# Patient Record
Sex: Female | Born: 1940 | Race: White | Hispanic: No | Smoking: Never smoker
Health system: Southern US, Community
[De-identification: ages and names within clinical notes are randomized; demographics above are authoritative.]

## PROBLEM LIST (undated history)

## (undated) DIAGNOSIS — I10 Essential (primary) hypertension: Secondary | ICD-10-CM

## (undated) DIAGNOSIS — R001 Bradycardia, unspecified: Secondary | ICD-10-CM

## (undated) DIAGNOSIS — E049 Nontoxic goiter, unspecified: Secondary | ICD-10-CM

## (undated) DIAGNOSIS — M1990 Unspecified osteoarthritis, unspecified site: Secondary | ICD-10-CM

## (undated) DIAGNOSIS — R413 Other amnesia: Secondary | ICD-10-CM

## (undated) DIAGNOSIS — R609 Edema, unspecified: Secondary | ICD-10-CM

## (undated) DIAGNOSIS — E785 Hyperlipidemia, unspecified: Secondary | ICD-10-CM

## (undated) DIAGNOSIS — F09 Unspecified mental disorder due to known physiological condition: Secondary | ICD-10-CM

## (undated) HISTORY — DX: Nontoxic goiter, unspecified: E04.9

## (undated) HISTORY — DX: Edema, unspecified: R60.9

## (undated) HISTORY — DX: Essential (primary) hypertension: I10

## (undated) HISTORY — DX: Other amnesia: R41.3

## (undated) HISTORY — DX: Unspecified mental disorder due to known physiological condition: F09

## (undated) HISTORY — DX: Hyperlipidemia, unspecified: E78.5

## (undated) HISTORY — DX: Bradycardia, unspecified: R00.1

## (undated) HISTORY — DX: Unspecified osteoarthritis, unspecified site: M19.90

---

## 2017-02-25 ENCOUNTER — Encounter: Payer: Self-pay | Admitting: Family Medicine

## 2017-02-25 ENCOUNTER — Ambulatory Visit (INDEPENDENT_AMBULATORY_CARE_PROVIDER_SITE_OTHER): Payer: Self-pay | Admitting: Family Medicine

## 2017-02-25 VITALS — BP 140/54 | HR 58 | Temp 98.2°F | Resp 12 | Ht 61.0 in | Wt 127.0 lb

## 2017-02-25 DIAGNOSIS — Z130 Encounter for screening for diseases of the blood and blood-forming organs and certain disorders involving the immune mechanism: Secondary | ICD-10-CM

## 2017-02-25 DIAGNOSIS — M25551 Pain in right hip: Secondary | ICD-10-CM

## 2017-02-25 DIAGNOSIS — E785 Hyperlipidemia, unspecified: Secondary | ICD-10-CM

## 2017-02-25 DIAGNOSIS — E049 Nontoxic goiter, unspecified: Secondary | ICD-10-CM

## 2017-02-25 DIAGNOSIS — R609 Edema, unspecified: Secondary | ICD-10-CM

## 2017-02-25 DIAGNOSIS — R001 Bradycardia, unspecified: Secondary | ICD-10-CM

## 2017-02-25 DIAGNOSIS — I10 Essential (primary) hypertension: Secondary | ICD-10-CM

## 2017-02-25 LAB — CBC WITH DIFFERENTIAL/PLATELET
Basophils Absolute: 54 cells/uL (ref 0–200)
Basophils Relative: 1 %
EOS PCT: 2 %
Eosinophils Absolute: 108 cells/uL (ref 15–500)
HCT: 39.4 % (ref 35.0–45.0)
Hemoglobin: 13.1 g/dL (ref 11.7–15.5)
LYMPHS PCT: 38 %
Lymphs Abs: 2052 cells/uL (ref 850–3900)
MCH: 29.8 pg (ref 27.0–33.0)
MCHC: 33.2 g/dL (ref 32.0–36.0)
MCV: 89.5 fL (ref 80.0–100.0)
MPV: 9.7 fL (ref 7.5–12.5)
Monocytes Absolute: 270 cells/uL (ref 200–950)
Monocytes Relative: 5 %
NEUTROS PCT: 54 %
Neutro Abs: 2916 cells/uL (ref 1500–7800)
PLATELETS: 269 10*3/uL (ref 140–400)
RBC: 4.4 MIL/uL (ref 3.80–5.10)
RDW: 13.3 % (ref 11.0–15.0)
WBC: 5.4 10*3/uL (ref 3.8–10.8)

## 2017-02-25 LAB — COMPLETE METABOLIC PANEL WITH GFR
ALBUMIN: 4.1 g/dL (ref 3.6–5.1)
ALK PHOS: 68 U/L (ref 33–130)
ALT: 18 U/L (ref 6–29)
AST: 28 U/L (ref 10–35)
BUN: 18 mg/dL (ref 7–25)
CALCIUM: 9 mg/dL (ref 8.6–10.4)
CO2: 22 mmol/L (ref 20–31)
CREATININE: 0.82 mg/dL (ref 0.60–0.93)
Chloride: 95 mmol/L — ABNORMAL LOW (ref 98–110)
GFR, EST AFRICAN AMERICAN: 80 mL/min (ref >=60)
GFR, Est Non African American: 70 mL/min (ref >=60)
Glucose, Bld: 61 mg/dL — ABNORMAL LOW (ref 65–99)
POTASSIUM: 4 mmol/L (ref 3.5–5.3)
Sodium: 133 mmol/L — ABNORMAL LOW (ref 135–146)
Total Bilirubin: 0.6 mg/dL (ref 0.2–1.2)
Total Protein: 6.9 g/dL (ref 6.1–8.1)

## 2017-02-25 LAB — LIPID PANEL
CHOLESTEROL: 142 mg/dL (ref <200)
HDL: 62 mg/dL (ref >50)
LDL Cholesterol: 65 mg/dL (ref <100)
Total CHOL/HDL Ratio: 2.3 Ratio (ref <5.0)
Triglycerides: 77 mg/dL (ref <150)
VLDL: 15 mg/dL (ref <30)

## 2017-02-25 LAB — POCT URINALYSIS DIP (DEVICE)
BILIRUBIN URINE: NEGATIVE
GLUCOSE, UA: NEGATIVE mg/dL
KETONES UR: NEGATIVE mg/dL
Leukocytes, UA: NEGATIVE
NITRITE: NEGATIVE
PH: 6 (ref 5.0–8.0)
PROTEIN: NEGATIVE mg/dL
Specific Gravity, Urine: 1.01 (ref 1.005–1.030)
Urobilinogen, UA: 0.2 mg/dL (ref 0.0–1.0)

## 2017-02-25 LAB — POCT GLYCOSYLATED HEMOGLOBIN (HGB A1C): Hemoglobin A1C: 5.6

## 2017-02-25 MED ORDER — HYDROCHLOROTHIAZIDE 50 MG PO TABS: 50.0000 mg | ORAL_TABLET | Freq: Every day | ORAL | 1 refills | Status: DC

## 2017-02-25 MED ORDER — VALSARTAN 80 MG PO TABS: 160.0000 mg | ORAL_TABLET | Freq: Every day | ORAL | 1 refills | Status: DC

## 2017-02-25 MED ORDER — ATORVASTATIN CALCIUM 10 MG PO TABS: 10.0000 mg | ORAL_TABLET | Freq: Every day | ORAL | 1 refills | Status: DC

## 2017-02-25 MED ORDER — VALSARTAN 80 MG PO TABS: 80.0000 mg | ORAL_TABLET | Freq: Every day | ORAL | 1 refills | Status: DC

## 2017-02-25 NOTE — Patient Instructions (Signed)
Changes to medications as follows:  Stop  Metoprolol as heart rate is low.   Increased Valsartan 160 mg daily.  Hydrochlorothiazide 50 mg daily  take an additional dose in the afternoon at 4:00 pm x 7 days then resume once daily for  current swelling.   She will need a referral to orthopedic surgery for evaluation of right hip pain.  I am also ordering an ultrasound of the neck to evaluate enlarged thyroid gland.       Edema Edema is when you have too much fluid in your body or under your skin. Edema may make your legs, feet, and ankles swell up. Swelling is also common in looser tissues, like around your eyes. This is a common condition. It gets more common as you get older. There are many possible causes of edema. Eating too much salt (sodium) and being on your feet or sitting for a long time can cause edema in your legs, feet, and ankles. Hot weather may make edema worse. Edema is usually painless. Your skin may look swollen or shiny. Follow these instructions at home:  Keep the swollen body part raised (elevated) above the level of your heart when you are sitting or lying down.  Do not sit still or stand for a long time.  Do not wear tight clothes. Do not wear garters on your upper legs.  Exercise your legs. This can help the swelling go down.  Wear elastic bandages or support stockings as told by your doctor.  Eat a low-salt (low-sodium) diet to reduce fluid as told by your doctor.  Depending on the cause of your swelling, you may need to limit how much fluid you drink (fluid restriction).  Take over-the-counter and prescription medicines only as told by your doctor. Contact a doctor if:  Treatment is not working.  You have heart, liver, or kidney disease and have symptoms of edema.  You have sudden and unexplained weight gain. Get help right away if:  You have shortness of breath or chest pain.  You cannot breathe when you lie down.  You have pain, redness,  or warmth in the swollen areas.  You have heart, liver, or kidney disease and get edema all of a sudden.  You have a fever and your symptoms get worse all of a sudden. Summary  Edema is when you have too much fluid in your body or under your skin.  Edema may make your legs, feet, and ankles swell up. Swelling is also common in looser tissues, like around your eyes.  Raise (elevate) the swollen body part above the level of your heart when you are sitting or lying down.  Follow your doctor's instructions about diet and how much fluid you can drink (fluid restriction). This information is not intended to replace advice given to you by your health care provider. Make sure you discuss any questions you have with your health care provider. Document Released: 02/25/2008 Document Revised: 09/26/2016 Document Reviewed: 09/26/2016 Elsevier Interactive Patient Education  2017 Elsevier Inc.  Hypertension Hypertension is another name for high blood pressure. High blood pressure forces your heart to work harder to pump blood. This can cause problems over time. There are two numbers in a blood pressure reading. There is a top number (systolic) over a bottom number (diastolic). It is best to have a blood pressure below 120/80. Healthy choices can help lower your blood pressure. You may need medicine to help lower your blood pressure if:  Your blood pressure cannot  be lowered with healthy choices.  Your blood pressure is higher than 130/80.  Follow these instructions at home: Eating and drinking  If directed, follow the DASH eating plan. This diet includes: ? Filling half of your plate at each meal with fruits and vegetables. ? Filling one quarter of your plate at each meal with whole grains. Whole grains include whole wheat pasta, brown rice, and whole grain bread. ? Eating or drinking low-fat dairy products, such as skim milk or low-fat yogurt. ? Filling one quarter of your plate at each meal with  low-fat (lean) proteins. Low-fat proteins include fish, skinless chicken, eggs, beans, and tofu. ? Avoiding fatty meat, cured and processed meat, or chicken with skin. ? Avoiding premade or processed food.  Eat less than 1,500 mg of salt (sodium) a day.  Limit alcohol use to no more than 1 drink a day for nonpregnant women and 2 drinks a day for men. One drink equals 12 oz of beer, 5 oz of wine, or 1 oz of hard liquor. Lifestyle  Work with your doctor to stay at a healthy weight or to lose weight. Ask your doctor what the best weight is for you.  Get at least 30 minutes of exercise that causes your heart to beat faster (aerobic exercise) most days of the week. This may include walking, swimming, or biking.  Get at least 30 minutes of exercise that strengthens your muscles (resistance exercise) at least 3 days a week. This may include lifting weights or pilates.  Do not use any products that contain nicotine or tobacco. This includes cigarettes and e-cigarettes. If you need help quitting, ask your doctor.  Check your blood pressure at home as told by your doctor.  Keep all follow-up visits as told by your doctor. This is important. Medicines  Take over-the-counter and prescription medicines only as told by your doctor. Follow directions carefully.  Do not skip doses of blood pressure medicine. The medicine does not work as well if you skip doses. Skipping doses also puts you at risk for problems.  Ask your doctor about side effects or reactions to medicines that you should watch for. Contact a doctor if:  You think you are having a reaction to the medicine you are taking.  You have headaches that keep coming back (recurring).  You feel dizzy.  You have swelling in your ankles.  You have trouble with your vision. Get help right away if:  You get a very bad headache.  You start to feel confused.  You feel weak or numb.  You feel faint.  You get very bad pain in  your: ? Chest. ? Belly (abdomen).  You throw up (vomit) more than once.  You have trouble breathing. Summary  Hypertension is another name for high blood pressure.  Making healthy choices can help lower blood pressure. If your blood pressure cannot be controlled with healthy choices, you may need to take medicine. This information is not intended to replace advice given to you by your health care provider. Make sure you discuss any questions you have with your health care provider. Document Released: 02/25/2008 Document Revised: 08/06/2016 Document Reviewed: 08/06/2016 Elsevier Interactive Patient Education  [employer]2018 Elsevier Inc.

## 2017-02-25 NOTE — Progress Notes (Signed)
Patient ID: Lynn [name], female    DOB: 1940-12-08, 76 y.o.   MRN: [id number]  PCP: Bing Neighbors, FNP   Subjective:  HPI Lynn [name] is a 76 y.o. female presents to establish care and for hypertension management. Lynn [name] is a native of [place], non-english speaking, and has resided in the [place] for a total of 5 years. Family history only significant for diabetes.   Only medical history includes hypertension and hyperlipidemia originally diagnosed and treated in [place].  Last medical encounter over 1.5 years in [place]. No history of CVA or MI.  Reports 1 syncopal episode 1 year ago which lasted about 2 minutes in which she passed out abruptly and regained consciousness. She was not evaluated in a hospital. No routine monitoring of blood pressure. Adheres to current medication regimen. Denies any recent syncopal episodes, headaches, dizziness, or chest pain. Adheres to a low salt diet.   She is accompanied today by her daughter who is serving as Equities trader. Suffers from multiple joint pain without a diagnosis of arthritis. Joint pain in right hip pain. Right Hip pain is gradually progressing over the last several months. Hip painful with walking standing and lying. She has taken OTC NSAIDS with minimal relief of pain.  Problem and medication list has been reviewed.   Social History   Social History  . Marital status: Widowed    Spouse name: N/A  . Number of children: N/A  . Years of education: N/A   Occupational History  . Not on file.   Social History Main Topics  . Smoking status: Never Smoker  . Smokeless tobacco: Never Used  . Alcohol use No  . Drug use: No  . Sexual activity: Not on file   Other Topics Concern  . Not on file   Social History Narrative  . No narrative on file    No family history on file. Review of Systems See HPI  Prior to Admission medications   Medication Sig Start Date End Date Taking? Authorizing Provider   atorvastatin (LIPITOR) 10 MG tablet Take 10 mg by mouth daily.   Yes [provider]  hydrochlorothiazide (HYDRODIURIL) 50 MG tablet Take 50 mg by mouth daily.   Yes [provider]  metoprolol tartrate (LOPRESSOR) 50 MG tablet Take 50 mg by mouth 2 (two) times daily.   Yes [provider]  valsartan (DIOVAN) 80 MG tablet Take 80 mg by mouth daily.   Yes [provider]    Past Medical, Surgical Family and Social History reviewed and updated.    Objective:   Today's Vitals   02/25/17 0843  BP: (!) 140/54  Pulse: (!) 58  Resp: 12  Temp: 98.2 F (36.8 C)  TempSrc: Oral  SpO2: 96%  Weight: 127 lb (57.6 kg)  Height: 5\' 1"  (1.549 m)     Wt Readings from Last 3 Encounters:  02/25/17 127 lb (57.6 kg)    Physical Exam  Constitutional: She is oriented to person, place, and time. She appears well-developed and well-nourished.  HENT:  Head: Normocephalic and atraumatic.  Eyes: Conjunctivae and EOM are normal. Pupils are equal, round, and reactive to light.  Neck: Normal range of motion. Neck supple. Thyromegaly present.  Cardiovascular: Regular rhythm, normal heart sounds and intact distal pulses.  Bradycardia present.   Pulmonary/Chest: Effort normal and breath sounds normal.  Musculoskeletal: She exhibits edema and tenderness. She exhibits no deformity.  Right hip joint and groin tenderness with weightbearing Non-reproducible with palpation of  hip joint or right groin Bilateral lower extremity edema (non-pitting) +2  Lymphadenopathy:    She has no cervical adenopathy.  Neurological: She is alert and oriented to person, place, and time. She has normal reflexes.  Skin: Skin is warm and dry.  Psychiatric: She has a normal mood and affect. Her behavior is normal. Judgment and thought content normal.    Assessment & Plan:  1. Edema, unspecified type - Brain natriuretic peptide - EKG 12-Lead  2. Enlarged thyroid gland - Thyroid Panel With  TSH - US Soft Tissue Head/Neck; Future  3. Hyperlipidemia, unspecified hyperlipidemia type - Lipid panel - Thyroid Panel With TSH - POCT glycosylated hemoglobin (Hb A1C)-5.6 -Atorvastatin 10 mg once daily   4. Essential hypertension, stable uncontrolled - COMPLETE METABOLIC PANEL WITH GFR - POCT glycosylated hemoglobin (Hb A1C) -Valsartan increased to 160 mg daily  -Hydrochlorothiazide 50 mg daily  take an additional dose in the afternoon at 4:00 pm x 7 days then resume once daily for  current swelling.  5. Screening for deficiency anemia - CBC with Differential  6. Bradycardia  -Discontinue Metoprolol  7. Right Hip Pain -Ambulatory referral Orthopedic surgery  RTC: 2 weeks hypertension and heart rate recheck. Refer to orthopedic surgery for evaluation of hip pain. Pt reports orange card application pending.  Godfrey PickKimberly S. Tiburcio PeaHarris, MSN, FNP-C [facility]  [address] [place], [place] [address] [phone]

## 2017-02-26 LAB — BRAIN NATRIURETIC PEPTIDE: BRAIN NATRIURETIC PEPTIDE: 54.4 pg/mL (ref <100)

## 2017-02-26 LAB — THYROID PANEL WITH TSH
FREE THYROXINE INDEX: 3.1 (ref 1.4–3.8)
T3 Uptake: 27 % (ref 22–35)
T4, Total: 11.4 ug/dL (ref 4.5–12.0)
TSH: 2.67 m[IU]/L

## 2017-03-01 DIAGNOSIS — R001 Bradycardia, unspecified: Secondary | ICD-10-CM

## 2017-03-01 DIAGNOSIS — E049 Nontoxic goiter, unspecified: Secondary | ICD-10-CM

## 2017-03-01 DIAGNOSIS — E785 Hyperlipidemia, unspecified: Secondary | ICD-10-CM | POA: Insufficient documentation

## 2017-03-01 DIAGNOSIS — R609 Edema, unspecified: Secondary | ICD-10-CM

## 2017-03-01 HISTORY — DX: Bradycardia, unspecified: R00.1

## 2017-03-01 HISTORY — DX: Nontoxic goiter, unspecified: E04.9

## 2017-03-01 HISTORY — DX: Hyperlipidemia, unspecified: E78.5

## 2017-03-01 HISTORY — DX: Edema, unspecified: R60.9

## 2017-03-05 ENCOUNTER — Ambulatory Visit (HOSPITAL_COMMUNITY): Payer: Self-pay

## 2017-03-10 ENCOUNTER — Ambulatory Visit: Payer: Self-pay | Admitting: Family Medicine

## 2017-11-16 ENCOUNTER — Ambulatory Visit: Payer: Self-pay | Admitting: Family Medicine

## 2017-12-16 ENCOUNTER — Ambulatory Visit (INDEPENDENT_AMBULATORY_CARE_PROVIDER_SITE_OTHER): Payer: Medicaid Other | Admitting: Family Medicine

## 2017-12-16 ENCOUNTER — Encounter: Payer: Self-pay | Admitting: Family Medicine

## 2017-12-16 VITALS — BP 128/60 | HR 64 | Temp 98.0°F | Ht 61.0 in | Wt 129.0 lb

## 2017-12-16 DIAGNOSIS — E785 Hyperlipidemia, unspecified: Secondary | ICD-10-CM

## 2017-12-16 DIAGNOSIS — R609 Edema, unspecified: Secondary | ICD-10-CM | POA: Diagnosis not present

## 2017-12-16 DIAGNOSIS — R82998 Other abnormal findings in urine: Secondary | ICD-10-CM | POA: Diagnosis not present

## 2017-12-16 DIAGNOSIS — I10 Essential (primary) hypertension: Secondary | ICD-10-CM | POA: Diagnosis not present

## 2017-12-16 DIAGNOSIS — M25551 Pain in right hip: Secondary | ICD-10-CM | POA: Diagnosis not present

## 2017-12-16 DIAGNOSIS — Z131 Encounter for screening for diabetes mellitus: Secondary | ICD-10-CM

## 2017-12-16 LAB — POCT URINALYSIS DIP (DEVICE)
BILIRUBIN URINE: NEGATIVE
Glucose, UA: NEGATIVE mg/dL
KETONES UR: NEGATIVE mg/dL
Nitrite: NEGATIVE
Protein, ur: NEGATIVE mg/dL
SPECIFIC GRAVITY, URINE: 1.015 (ref 1.005–1.030)
Urobilinogen, UA: 0.2 mg/dL (ref 0.0–1.0)
pH: 7 (ref 5.0–8.0)

## 2017-12-16 LAB — POCT GLYCOSYLATED HEMOGLOBIN (HGB A1C): HEMOGLOBIN A1C: 5.7

## 2017-12-16 MED ORDER — LOSARTAN POTASSIUM 50 MG PO TABS: 50.0000 mg | ORAL_TABLET | Freq: Every day | ORAL | 3 refills | Status: DC

## 2017-12-16 MED ORDER — ATORVASTATIN CALCIUM 10 MG PO TABS: 10.0000 mg | ORAL_TABLET | Freq: Every day | ORAL | 1 refills | Status: DC

## 2017-12-16 MED ORDER — METOPROLOL TARTRATE 25 MG PO TABS
25.0000 mg | ORAL_TABLET | Freq: Two times a day (BID) | ORAL | 3 refills | Status: DC
Start: 2017-12-16 — End: 2018-04-08

## 2017-12-16 MED ORDER — HYDROCHLOROTHIAZIDE 50 MG PO TABS: 50.0000 mg | ORAL_TABLET | Freq: Every day | ORAL | 1 refills | Status: DC

## 2017-12-16 NOTE — Progress Notes (Signed)
Patient ID: Lynn [name], female    DOB: 1940-12-02, 77 y.o.   MRN: [id number]  PCP: Bing Neighbors, FNP  Chief Complaint  Patient presents with  . Leg Pain    right  . Hypertension    Subjective:  HPI Lynn [name] is a 77 y.o. female with hypertension presents for evaluation of hypertension and hip pain.  Hypertension  Cheris  reports no home monitoring of blood pressure. She has been obtaining blood pressure medications from out of country and taking medications different than prescribed. At present, she is currently taking hydrochlorothiazide, losartan, metoprolol and atorvastatin for hyperlipidemia. She is inactive of exercise although leads a active lifestyle. She is a non-smoker. Denies any episodes of dizziness, headaches, shortness of breath, or chest pain.  Right Hip Pain Onset of symptoms 2 months previously. Denies injury. Characterizes hip pain as aching and radiates into right groin area. She has attempted relief with ibuprofen with some relief. Requests to post-pone imaging as she is uncertain of the amount of cost covered by insurance. Social History   Socioeconomic History  . Marital status: Widowed    Spouse name: Not on file  . Number of children: Not on file  . Years of education: Not on file  . Highest education level: Not on file  Occupational History  . Not on file  Social Needs  . Financial resource strain: Not on file  . Food insecurity:    Worry: Not on file    Inability: Not on file  . Transportation needs:    Medical: Not on file    Non-medical: Not on file  Tobacco Use  . Smoking status: Never Smoker  . Smokeless tobacco: Never Used  Substance and Sexual Activity  . Alcohol use: No  . Drug use: No  . Sexual activity: Not on file  Lifestyle  . Physical activity:    Days per week: Not on file    Minutes per session: Not on file  . Stress: Not on file  Relationships  . Social connections:    Talks on phone: Not on file     Gets together: Not on file    Attends religious service: Not on file    Active member of club or organization: Not on file    Attends meetings of clubs or organizations: Not on file    Relationship status: Not on file  . Intimate partner violence:    Fear of current or ex partner: Not on file    Emotionally abused: Not on file    Physically abused: Not on file    Forced sexual activity: Not on file  Other Topics Concern  . Not on file  Social History Narrative  . Not on file    Family History  Problem Relation Age of Onset  . Hypertension Sister    Review of Systems Pertinent negatives listed in HPI  Patient Active Problem List   Diagnosis Date Noted  . Bradycardia 03/01/2017  . 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 Hyperlipidemia 03/01/2017  . Edema 03/01/2017    No Known Allergies  Prior to Admission medications   Medication Sig Start Date End Date Taking? Authorizing Provider  atorvastatin (LIPITOR) 10 MG tablet Take 1 tablet (10 mg total) by mouth daily at 6 PM. Patient not taking: Reported on 12/16/2017 02/25/17   Bing Neighbors, FNP  hydrochlorothiazide (HYDRODIURIL) 50 MG tablet Take 1 tablet (50 mg total) by mouth daily. Make take an additional 50 mg dose before  4:00 pm as needed for acute swelling. Patient not taking: Reported on 12/16/2017 02/25/17   Bing NeighborsHarris, Ruth Tully S, FNP  valsartan (DIOVAN) 80 MG tablet Take 2 tablets (160 mg total) by mouth daily. Patient not taking: Reported on 12/16/2017 02/25/17   Bing NeighborsHarris, Mckensey Berghuis S, FNP    Past Medical, Surgical Family and Social History reviewed and updated.    Objective:   Today's Vitals   12/16/17 1337  BP: 128/60  Pulse: 64  Temp: 98 F (36.7 C)  TempSrc: Oral  SpO2: 99%  Weight: 129 lb (58.5 kg)  Height: 5\' 1"  (1.549 m)    Wt Readings from Last 3 Encounters:  12/16/17 129 lb (58.5 kg)  02/25/17 127 lb (57.6 kg)   Physical Exam  Constitutional: Patient appears well-developed and well-nourished. No  distress. HENT: Normocephalic, atraumatic, External right and left ear normal.  Eyes: Conjunctivae and EOM are normal. PERRLA, no scleral icterus. Neck: Normal ROM. Neck supple. No JVD. No tracheal deviation. No thyromegaly. CVS: RRR, S1/S2 +, no murmurs, no gallops, no carotid bruit.  Pulmonary: Effort and breath sounds normal, no stridor, rhonchi, wheezes, rales.  Abdominal: Soft. BS +, no distension, tenderness, rebound or guarding.  Musculoskeletal: Normal range of motion. No edema and no tenderness.  Neuro: Alert. Normal reflexes, muscle tone coordination. No cranial nerve deficit. Skin: Skin is warm and dry. No rash noted. Not diaphoretic. No erythema. No pallor. Psychiatric: Normal mood and affect. Behavior, judgment, thought content normal.   Assessment & Plan:  1. Essential hypertension, Stable. We have discussed target BP range and blood pressure goal. I have advised patient to check BP regularly and to call us back or report to clinic if the numbers are consistently higher than 140/90. We discussed the importance of compliance with medical therapy and DASH diet recommended, consequences of uncontrolled hypertension discussed.  Continue current BP medications  2. Hyperlipidemia, unspecified hyperlipidemia type, lipid panel deferred as patient wanted to investigate the cost of labs. Continue Lipitor.   3. Edema, unspecified type, increased Hydrochlorothiazide 50 mg once daily. -Decrease sodium -Elevated legs daily   4. Right hip pain, continue acetaminophen 500 mg every 6 hours as needed for pain. DG HIP UNILAT W OR W/O PELVIS 2-3 VIEWS RIGHT, pending  - Ambulatory referral to Orthopedic Surgery  5. Screening for diabetes mellitus, (Hb A1C)  5.7 prediabetes.  6. Urine leukocytes, Urine Culture   Meds ordered this encounter  Medications  . hydrochlorothiazide (HYDRODIURIL) 50 MG tablet    Sig: Take 1 tablet (50 mg total) by mouth daily. Make take an additional 50 mg dose  before 4:00 pm as needed for acute swelling.    Dispense:  90 tablet    Refill:  1    Order Specific Question:   Supervising Provider    Answer:   Quentin AngstJEGEDE, OLUGBEMIGA E [id number]]  . atorvastatin (LIPITOR) 10 MG tablet    Sig: Take 1 tablet (10 mg total) by mouth daily at 6 PM.    Dispense:  90 tablet    Refill:  1    Order Specific Question:   Supervising Provider    Answer:   Quentin AngstJEGEDE, OLUGBEMIGA E [id number]]  . losartan (COZAAR) 50 MG tablet    Sig: Take 1 tablet (50 mg total) by mouth daily.    Dispense:  90 tablet    Refill:  3    Order Specific Question:   Supervising Provider    Answer:   Quentin AngstJEGEDE, OLUGBEMIGA E [id number]]  . metoprolol tartrate (LOPRESSOR)  25 MG tablet    Sig: Take 1 tablet (25 mg total) by mouth 2 (two) times daily.    Dispense:  180 tablet    Refill:  3    Order Specific Question:   Supervising Provider    Answer:   Quentin Angst [id number]     Orders Placed This Encounter  Procedures  . Urine Culture  . DG HIP UNILAT W OR W/O PELVIS 2-3 VIEWS RIGHT    Standing Status:   Future    Standing Expiration Date:   12/16/2018    Order Specific Question:   Reason for Exam (SYMPTOM  OR DIAGNOSIS REQUIRED)    Answer:   right hip pain rule out degenerative changes    Order Specific Question:   Preferred imaging location?    Answer:   External    Order Specific Question:   Call Results- Best Contact Number?    Answer:   [phone]  . Ambulatory referral to Orthopedic Surgery    Referral Priority:   Routine    Referral Type:   Surgical    Referral Reason:   Specialty Services Required    Requested Specialty:   Orthopedic Surgery    Number of Visits Requested:   1  . POCT glycosylated hemoglobin (Hb A1C)  . POCT urinalysis dip (device)     RTC: 6 months chronic conditions    Godfrey Pick. Tiburcio Pea, MSN, FNP-C [facility]  [address] [place], [place] [address] [phone]

## 2017-12-18 LAB — URINE CULTURE: Organism ID, Bacteria: NO GROWTH

## 2018-01-15 ENCOUNTER — Other Ambulatory Visit: Payer: Self-pay

## 2018-01-15 MED ORDER — LOSARTAN POTASSIUM 50 MG PO TABS: 50.0000 mg | ORAL_TABLET | Freq: Every day | ORAL | 0 refills | Status: DC

## 2018-01-15 NOTE — Telephone Encounter (Signed)
Medication refilled

## 2018-01-29 ENCOUNTER — Other Ambulatory Visit: Payer: Self-pay

## 2018-01-29 ENCOUNTER — Ambulatory Visit (INDEPENDENT_AMBULATORY_CARE_PROVIDER_SITE_OTHER): Payer: Medicaid Other | Admitting: Family Medicine

## 2018-01-29 ENCOUNTER — Encounter: Payer: Self-pay | Admitting: Family Medicine

## 2018-01-29 VITALS — BP 160/66 | HR 60 | Temp 98.2°F | Ht 61.0 in | Wt 130.0 lb

## 2018-01-29 DIAGNOSIS — G8929 Other chronic pain: Secondary | ICD-10-CM

## 2018-01-29 DIAGNOSIS — R413 Other amnesia: Secondary | ICD-10-CM

## 2018-01-29 DIAGNOSIS — Z789 Other specified health status: Secondary | ICD-10-CM | POA: Diagnosis not present

## 2018-01-29 DIAGNOSIS — M25551 Pain in right hip: Secondary | ICD-10-CM | POA: Diagnosis not present

## 2018-01-29 DIAGNOSIS — R001 Bradycardia, unspecified: Secondary | ICD-10-CM | POA: Diagnosis not present

## 2018-01-29 DIAGNOSIS — I10 Essential (primary) hypertension: Secondary | ICD-10-CM | POA: Diagnosis not present

## 2018-01-29 MED ORDER — LOSARTAN POTASSIUM 50 MG PO TABS: 50.0000 mg | ORAL_TABLET | Freq: Two times a day (BID) | ORAL | 1 refills | Status: DC

## 2018-01-29 MED ORDER — HYDROCHLOROTHIAZIDE 50 MG PO TABS: 50.0000 mg | ORAL_TABLET | Freq: Every day | ORAL | 1 refills | Status: DC

## 2018-01-29 NOTE — Progress Notes (Signed)
Subjective:    Patient ID: Lynn [name], female    DOB: 01-06-1941, 77 y.o.   MRN: [id number]  HPI Lynn [name], a 77 year old female presents accompanied by daughter for a follow up of hypertension. Pt primarily speaks Farsi, using daughter to assist with communication. Patient has been taking medication consistently. However, she stated that losartan was not prescribed correctly. While living in [place], she was taking Losartan 50 mg twice daily. She says that medication was lowered to once daily. Patient's daughter monitors blood pressure consistently at home and states that its been elevated. Blood pressures have ranged 150-160s systaltically.  Patient endorses fatigue, bilateral lower extremity edema and periodic dizziness. She denies heart palpitations, shortness of breath, near syncope, or chest pain.   Patinet is also complaining of chronic right hip pain. Daughter is requesting a referral to orthopedic services for further work up and evaluation. Patient is not having pain at present. She characterizes pain as frequent stiffness and inability to move as freely as she has in previous years. She mainly has pain upon awakening. Pain is also worsened by prolonged sitting, standing, and increased activity level. She has not attempted OTC analgesics to alleviate current symptoms.   Lynn [name] daughter is also requesting a referral to neurology for evaluation of memory deficits. She says that she has noticed a worsening of short term memory loss over the past several months.  She endorses a family history of dementia. Patient's sister is suffering from dementia at this time. Patient has refused medications to slow memory loss in the past. Patient's daughter states that her sister's dementia worsened after starting medications.  Functional Assessment:  Activities of Daily Living (ADLs):   She is independent in the following: ambulation, bathing and hygiene, feeding, continence, grooming,  toileting and dressing Requires assistance with the following: preparing meals  Past Medical History:  Diagnosis Date  . Hypertension    Social History   Socioeconomic History  . Marital status: Widowed    Spouse name: Not on file  . Number of children: Not on file  . Years of education: Not on file  . Highest education level: Not on file  Occupational History  . Not on file  Social Needs  . Financial resource strain: Not on file  . Food insecurity:    Worry: Not on file    Inability: Not on file  . Transportation needs:    Medical: Not on file    Non-medical: Not on file  Tobacco Use  . Smoking status: Never Smoker  . Smokeless tobacco: Never Used  Substance and Sexual Activity  . Alcohol use: No  . Drug use: No  . Sexual activity: Not on file  Lifestyle  . Physical activity:    Days per week: Not on file    Minutes per session: Not on file  . Stress: Not on file  Relationships  . Social connections:    Talks on phone: Not on file    Gets together: Not on file    Attends religious service: Not on file    Active member of club or organization: Not on file    Attends meetings of clubs or organizations: Not on file    Relationship status: Not on file  . Intimate partner violence:    Fear of current or ex partner: Not on file    Emotionally abused: Not on file    Physically abused: Not on file    Forced sexual activity: Not on file  Other Topics Concern  . Not on file  Social History Narrative  . Not on file    There is no immunization history on file for this patient. MMSE - Mini Mental State Exam 01/29/2018  Orientation to time 5  Orientation to Place 5  Registration 3  Attention/ Calculation 5  Recall 3  Language- name 2 objects 1  Language- repeat 1  Language- follow 3 step command 3  Language- read & follow direction (No Data)  Language-read & follow direction-comments Unable to perform due to language deficit  Write a sentence 1  Copy design 1     Review of Systems  Eyes: Negative.   Respiratory: Negative.   Cardiovascular: Negative.   Gastrointestinal: Negative.   Endocrine: Negative for polydipsia, polyphagia and polyuria.  Genitourinary: Negative.   Musculoskeletal: Negative.   Skin: Negative.   Allergic/Immunologic: Negative.   Neurological: Negative.  Negative for dizziness, tremors, weakness, light-headedness, numbness and headaches.  Hematological: Negative.   Psychiatric/Behavioral: Negative.  Negative for confusion, decreased concentration and dysphoric mood. The patient is not nervous/anxious.        Objective:   Physical Exam  Constitutional: She appears well-developed and well-nourished.  Eyes: Pupils are equal, round, and reactive to light.  Cardiovascular: Regular rhythm. Bradycardia present.  Trace edema to lower extremities  Pulmonary/Chest: Effort normal and breath sounds normal.  Abdominal: Soft. Bowel sounds are normal.  Musculoskeletal: Normal range of motion.       Right hip: She exhibits decreased strength. She exhibits normal range of motion and no swelling.  Neurological: She is alert.  Skin: Skin is warm and dry.  Psychiatric: She has a normal mood and affect. Her behavior is normal. Judgment and thought content normal.      BP (!) 160/66 (BP Location: Left Arm, Patient Position: Sitting, Cuff Size: Small)   Pulse 60   Temp 98.2 F (36.8 C) (Oral)   Ht  (1.549 m)   Wt 130 lb (59 kg)   SpO2 97%   BMI 24.56 kg/m  Assessment & Plan:  1. Essential hypertension Blood pressure is above goal on current medication regimen. Will increase losartan to previous dosage. Patient will return in 2 weeks for blood pressure check. Will review renal functioning as results become available.   - losartan (COZAAR) 50 MG tablet; Take 1 tablet (50 mg total) by mouth 2 (two) times daily.  Dispense: 180 tablet; Refill: 1 - Ambulatory referral to Ophthalmology - EKG 12-Lead - Comprehensive metabolic  panel - Lipid Panel; Future  2. Memory deficit MMSE, unremarkable. Discussed starting a trial of Aricept at length. Patient's daughter is not interested in having her start a medication at this time. Will defer to neurology for further work up and evaluation of this problem.  - Ambulatory referral to Neurology  3. Chronic right hip pain Recommend Tylenol 500 mg every 6 hours as needed for mild to moderate right hip pain.  - AMB referral to orthopedics  4. Bradycardia Reviewed EKG, sinus bradycardia. I suspect that it is secondary to taking beta blocker. Will lower metoprolol to 12.5 mg BID. Will recheck bp in 2 weeks.   5. Language barrier to communication  Patient primarily speaks [place], but understands some english. Daughter is assisting with communication   RTC: 2 weeks for blood pressure check and 1 month for hypertension  Nolon Nations  MSN, FNP-C Patient Care [facility] [address] [place], [place] [address] [phone]

## 2018-01-29 NOTE — Patient Instructions (Signed)
Blood pressure is above goal on current medication regimen.  Will order losartan 50 mg twice daily and will repeat blood pressure in 2 weeks to ensure that it is normalized.  I have sent referrals to the following specialist:  Orthopedic referral, ophthalmology referral, and neurology referral.  The specialist will give you a call to set up appointments. I recommend a low-fat, low-salt diet.  Also, ensure that water intake is adequate throughout the day.  Typically wherever his sodium will go water will follow it or vice versa.  Elevate lower extremities to heart level while at rest. Avoid over-the-counter anti-inflammatory medications.

## 2018-01-30 LAB — COMPREHENSIVE METABOLIC PANEL
ALK PHOS: 78 IU/L (ref 39–117)
ALT: 20 IU/L (ref 0–32)
AST: 26 IU/L (ref 0–40)
Albumin/Globulin Ratio: 1.8 (ref 1.2–2.2)
Albumin: 4.4 g/dL (ref 3.5–4.8)
BUN/Creatinine Ratio: 15 (ref 12–28)
BUN: 12 mg/dL (ref 8–27)
Bilirubin Total: 0.4 mg/dL (ref 0.0–1.2)
CALCIUM: 9.7 mg/dL (ref 8.7–10.3)
CO2: 27 mmol/L (ref 20–29)
CREATININE: 0.8 mg/dL (ref 0.57–1.00)
Chloride: 90 mmol/L — ABNORMAL LOW (ref 96–106)
GFR calc Af Amer: 82 mL/min/{1.73_m2} (ref >59)
GFR, EST NON AFRICAN AMERICAN: 71 mL/min/{1.73_m2} (ref >59)
Globulin, Total: 2.5 g/dL (ref 1.5–4.5)
Glucose: 90 mg/dL (ref 65–99)
POTASSIUM: 3.6 mmol/L (ref 3.5–5.2)
SODIUM: 131 mmol/L — AB (ref 134–144)
Total Protein: 6.9 g/dL (ref 6.0–8.5)

## 2018-02-01 ENCOUNTER — Other Ambulatory Visit: Payer: Self-pay | Admitting: Family Medicine

## 2018-02-01 ENCOUNTER — Telehealth: Payer: Self-pay

## 2018-02-01 DIAGNOSIS — I10 Essential (primary) hypertension: Secondary | ICD-10-CM

## 2018-02-01 DIAGNOSIS — E871 Hypo-osmolality and hyponatremia: Secondary | ICD-10-CM

## 2018-02-01 MED ORDER — HYDROCHLOROTHIAZIDE 25 MG PO TABS: 50.0000 mg | ORAL_TABLET | Freq: Every day | ORAL | 1 refills | Status: DC

## 2018-02-01 NOTE — Telephone Encounter (Signed)
Called and spoke with patients daughter, advised that sodium level is decreased and that she needs to only take 1/2 a tablet of hydrochlorothiazide which is  daily. Advised that this could be the cause of confusion. She is scheduled for 3 weeks for a bp check and we will recheck labs at this time. Thanks!

## 2018-02-01 NOTE — Telephone Encounter (Signed)
-----   Message from Massie Maroon, [place] sent at 02/01/2018  5:11 AM EDT ----- Regarding: lab results Please inform patient/daughter that serum sodium level is decreased. Will lower hydrochlorothiazide to 25 mg daily. She is currently on 50 mg, cut tablets in half. Low sodium levels can lead to confusion in elderly patients. Will repeat BMP in 3 weeks.   Nolon Nations  MSN, FNP-C Patient Care [facility] [address] [place], [place] [address] [phone]

## 2018-02-01 NOTE — Progress Notes (Signed)
Meds ordered this encounter  Medications  . hydrochlorothiazide (HYDRODIURIL) 25 MG tablet    Sig: Take 2 tablets (50 mg total) by mouth daily. Make take an additional 50 mg dose before 4:00 pm as needed for acute swelling.    Dispense:  90 tablet    Refill:  1    Nolon Nations  MSN, FNP-C Patient [facility] [address] [place], [place] [address] [phone]

## 2018-02-01 NOTE — Progress Notes (Signed)
Orders Placed This Encounter  Procedures  . Basic Metabolic Panel (BMET)    Standing Status:   Future    Standing Expiration Date:   02/02/2019    Nolon Nations  MSN, FNP-C Patient Care [facility] [address] [place], [place] [address] [phone]

## 2018-02-16 ENCOUNTER — Encounter (INDEPENDENT_AMBULATORY_CARE_PROVIDER_SITE_OTHER): Payer: Self-pay | Admitting: Orthopaedic Surgery

## 2018-02-16 ENCOUNTER — Ambulatory Visit (INDEPENDENT_AMBULATORY_CARE_PROVIDER_SITE_OTHER): Payer: Medicaid Other

## 2018-02-16 ENCOUNTER — Ambulatory Visit (INDEPENDENT_AMBULATORY_CARE_PROVIDER_SITE_OTHER): Payer: Medicaid Other | Admitting: Orthopaedic Surgery

## 2018-02-16 DIAGNOSIS — M542 Cervicalgia: Secondary | ICD-10-CM

## 2018-02-16 DIAGNOSIS — M25551 Pain in right hip: Secondary | ICD-10-CM

## 2018-02-16 MED ORDER — MELOXICAM 7.5 MG PO TABS: 7.5000 mg | ORAL_TABLET | Freq: Two times a day (BID) | ORAL | 2 refills | Status: DC | PRN

## 2018-02-16 NOTE — Progress Notes (Signed)
Office Visit Note   Patient: Lynn [name]           Date of Birth: 05/14/1941           MRN: [id number] Visit Date: 02/16/2018              Requested by: Massie Maroon, FNP [address] [place], [place] [address] PCP: Bing Neighbors, FNP   Assessment & Plan: Visit Diagnoses:  1. Pain in right hip   2. 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  Recommend meloxicam as needed.  Patient would like to hold off on cortisone injection for now.  Follow-up as needed. Total face to face encounter time was greater than 45 minutes and over half of this time was spent in counseling and/or coordination of care.  Follow-Up Instructions: Return if symptoms worsen or fail to improve.   Orders:  Orders Placed This Encounter  Procedures  . XR HIP UNILAT W OR W/O PELVIS 2-3 VIEWS RIGHT  . XR Cervical Spine 2 or 3 views   Meds ordered this encounter  Medications  . meloxicam (MOBIC) 7.5 MG tablet    Sig: Take 1 tablet (7.5 mg total) by mouth 2 (two) times daily as needed for pain.    Dispense:  30 tablet    Refill:  2      Procedures: No procedures performed   Clinical Data: No additional findings.   Subjective: Chief Complaint  Patient presents with  . Right Hip - Pain  . Neck - Numbness, Tingling    Patient is a 77 year old female who presents here with her daughter.  She has had chronic right hip and neck pain without radicular symptoms.  She states that her hip pain is localized to the hip joint and into the groin.  Denies any radicular symptoms.  Denies any numbness and tingling.  Denies any injuries.   Review of Systems  Constitutional: Negative.   HENT: Negative.   Eyes: Negative.   Respiratory: Negative.   Cardiovascular: Negative.   Endocrine: Negative.   Musculoskeletal: Negative.   Neurological: Negative.   Hematological: Negative.   Psychiatric/Behavioral:  Negative.   All other systems reviewed and are negative.    Objective: Vital Signs: There were no vitals taken for this visit.  Physical Exam  Constitutional: She is oriented to person, place, and time. She appears well-developed and well-nourished.  HENT:  Head: Normocephalic and atraumatic.  Eyes: EOM are normal.  Neck: Neck supple.  Pulmonary/Chest: Effort normal.  Abdominal: Soft.  Neurological: She is alert and oriented to person, place, and time.  Skin: Skin is warm. Capillary refill takes less than 2 seconds.  Psychiatric: She has a normal mood and affect. Her behavior is normal. Judgment and thought content normal.  Nursing note and vitals reviewed.   Ortho Exam Cervical spine exam shows no focal deficits or provocative signs.  Normal strength.  Normal range of motion.  Negative Spurling's. Right hip exam shows positive FADIR.  Negative Stinchfield.  Trochanteric bursa is nontender. Specialty Comments:  No specialty comments available.  Imaging: Xr Hip Unilat W Or W/o Pelvis 2-3 Views Right  Result Date: 02/16/2018 No significant joint space narrowing.  No structural abnormalities  Xr Cervical Spine 2 Or 3 Views  Result Date: 02/16/2018 Multilevel degenerative disc disease and cervical spondylosis.    PMFS History: Patient Active Problem List   Diagnosis Date Noted  .  Bradycardia 03/01/2017  . 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 Hyperlipidemia 03/01/2017  . Edema 03/01/2017   Past Medical History:  Diagnosis Date  . Hypertension     Family History  Problem Relation Age of Onset  . Hypertension Sister     History reviewed. No pertinent surgical history. Social History   Occupational History  . Not on file  Tobacco Use  . Smoking status: Never Smoker  . Smokeless tobacco: Never Used  Substance and Sexual Activity  . Alcohol use: No  . Drug use: No  . Sexual activity: Not on file

## 2018-03-22 ENCOUNTER — Encounter: Payer: Self-pay | Admitting: Family Medicine

## 2018-03-22 ENCOUNTER — Ambulatory Visit (INDEPENDENT_AMBULATORY_CARE_PROVIDER_SITE_OTHER): Payer: Medicaid Other | Admitting: Family Medicine

## 2018-03-22 VITALS — BP 120/64 | HR 60 | Temp 98.0°F | Ht 61.0 in | Wt 129.0 lb

## 2018-03-22 DIAGNOSIS — I10 Essential (primary) hypertension: Secondary | ICD-10-CM | POA: Diagnosis not present

## 2018-03-22 DIAGNOSIS — R8290 Unspecified abnormal findings in urine: Secondary | ICD-10-CM

## 2018-03-22 DIAGNOSIS — Z09 Encounter for follow-up examination after completed treatment for conditions other than malignant neoplasm: Secondary | ICD-10-CM

## 2018-03-22 DIAGNOSIS — R600 Localized edema: Secondary | ICD-10-CM

## 2018-03-22 DIAGNOSIS — N390 Urinary tract infection, site not specified: Secondary | ICD-10-CM

## 2018-03-22 DIAGNOSIS — R609 Edema, unspecified: Secondary | ICD-10-CM | POA: Diagnosis not present

## 2018-03-22 DIAGNOSIS — R319 Hematuria, unspecified: Secondary | ICD-10-CM

## 2018-03-22 LAB — POCT URINALYSIS DIP (MANUAL ENTRY)
Bilirubin, UA: NEGATIVE
Glucose, UA: NEGATIVE mg/dL
Ketones, POC UA: NEGATIVE mg/dL
Nitrite, UA: NEGATIVE
Protein Ur, POC: NEGATIVE mg/dL
Spec Grav, UA: 1.015 (ref 1.010–1.025)
Urobilinogen, UA: 0.2 E.U./dL
pH, UA: 6.5 (ref 5.0–8.0)

## 2018-03-22 MED ORDER — SULFAMETHOXAZOLE-TRIMETHOPRIM 800-160 MG PO TABS: 1.0000 | ORAL_TABLET | Freq: Two times a day (BID) | ORAL | 0 refills | Status: DC

## 2018-03-22 NOTE — Progress Notes (Signed)
Subjective:    Patient ID: Lynn [name], female    DOB: March 01, 1941, 77 y.o.   MRN: [id number]   PCP: Raliegh IpNatalie Kenecia Barren, NP  Chief Complaint  Patient presents with  . Joint Swelling    bilateral    HPI  Ms. Lynn [name] has a past medical history of Hypertension. She is here today for follow up.   Current Status: Since her last office visit, she is doing well. She has c/o swollen knees and lower legs X 7 days. She denies pain in legs.   She denies fevers, chills, fatigue, recent infections, weight loss, and night sweats.   She has not had any headaches, visual changes, dizziness, and falls. No chest pain, heart palpitations, cough and shortness of breath reported.   No reports of GI problems such as nausea, vomiting, diarrhea, and constipation. She has no reports of blood in stools, dysuria and hematuria.   No depression or anxiety, and denies suicidal ideations, homicidal ideations, or auditory hallucinations.   She denies pain today.   Past Medical History:  Diagnosis Date  . Hypertension     Family History  Problem Relation Age of Onset  . Hypertension Sister     Social History   Socioeconomic History  . Marital status: Widowed    Spouse name: Not on file  . Number of children: Not on file  . Years of education: Not on file  . Highest education level: Not on file  Occupational History  . Not on file  Social Needs  . Financial resource strain: Not on file  . Food insecurity:    Worry: Not on file    Inability: Not on file  . Transportation needs:    Medical: Not on file    Non-medical: Not on file  Tobacco Use  . Smoking status: Never Smoker  . Smokeless tobacco: Never Used  Substance and Sexual Activity  . Alcohol use: No  . Drug use: No  . Sexual activity: Not on file  Lifestyle  . Physical activity:    Days per week: Not on file    Minutes per session: Not on file  . Stress: Not on file  Relationships  . Social connections:    Talks on phone:  Not on file    Gets together: Not on file    Attends religious service: Not on file    Active member of club or organization: Not on file    Attends meetings of clubs or organizations: Not on file    Relationship status: Not on file  . Intimate partner violence:    Fear of current or ex partner: Not on file    Emotionally abused: Not on file    Physically abused: Not on file    Forced sexual activity: Not on file  Other Topics Concern  . Not on file  Social History Narrative  . Not on file    History reviewed. No pertinent surgical history.    There is no immunization history on file for this patient.  Current Meds  Medication Sig  . atorvastatin (LIPITOR) 10 MG tablet Take 1 tablet (10 mg total) by mouth daily at 6 PM.  . hydrochlorothiazide (HYDRODIURIL) 25 MG tablet Take 2 tablets (50 mg total) by mouth daily. Make take an additional 50 mg dose before 4:00 pm as needed for acute swelling.  . hydrochlorothiazide (HYDRODIURIL) 50 MG tablet Take 50 mg by mouth.  [name]. ibuprofen (ADVIL,MOTRIN) 200 MG tablet Take 200 mg by mouth every  6 (six) hours as needed.  [name] losartan (COZAAR) 50 MG tablet Take 1 tablet (50 mg total) by mouth 2 (two) times daily.  . metoprolol tartrate (LOPRESSOR) 25 MG tablet Take 1 tablet (25 mg total) by mouth 2 (two) times daily.    No Known Allergies  BP 120/64 (BP Location: Left Arm, Patient Position: Sitting, Cuff Size: Small)   Pulse 60   Temp 98 F (36.7 C) (Oral)   Ht 5\' 1"  (1.549 m)   Wt 129 lb (58.5 kg)   SpO2 97%   BMI 24.37 kg/m   Review of Systems  Constitutional: Negative.   HENT: Negative.   Eyes: Negative.   Respiratory: Negative.   Cardiovascular: Negative.   Gastrointestinal: Negative.   Endocrine: Negative.   Genitourinary: Negative.   Musculoskeletal: Negative.   Skin: Negative.   Allergic/Immunologic: Negative.   Neurological: Negative.   Hematological: Negative.   Psychiatric/Behavioral: Negative.    Objective:    Physical Exam  Constitutional: She is oriented to person, place, and time. She appears well-developed and well-nourished.  HENT:  Head: Normocephalic and atraumatic.  Right Ear: External ear normal.  Left Ear: External ear normal.  Nose: Nose normal.  Mouth/Throat: Oropharynx is clear and moist.  Eyes: Pupils are equal, round, and reactive to light. Conjunctivae and EOM are normal.  Neck: Normal range of motion. Neck supple.  Cardiovascular: Normal rate, regular rhythm, normal heart sounds and intact distal pulses.  Pulmonary/Chest: Effort normal and breath sounds normal.  Abdominal: Soft. Bowel sounds are normal.  Musculoskeletal: Normal range of motion. She exhibits edema (1+ edema bilateral lower extremities).  Neurological: She is alert and oriented to person, place, and time.  Skin: Skin is warm and dry. Capillary refill takes less than 2 seconds.  Psychiatric: She has a normal mood and affect. Her behavior is normal. Judgment and thought content normal.  Nursing note and vitals reviewed.  Assessment & Plan:   1. Essential hypertension Blood pressure is good today at 120/64. She will continue HCTZ, Losartan, and Metoprolol as prescribed.    Decrease high sodium intake, excessive alcohol intake, increase potassium intake, smoking cessation, and increase physical activity. Follow DASH diet.  - POCT urinalysis dipstick  2. Abnormal urinalysis We will order urine culture to further evaluate.  - sulfamethoxazole-trimethoprim (BACTRIM DS,SEPTRA DS) 800-160 MG tablet; Take 1 tablet by mouth 2 (two) times daily.  Dispense: 20 tablet; Refill: 0 - Urine Culture  3. Urinary tract infection with hematuria, site unspecified - sulfamethoxazole-trimethoprim (BACTRIM DS,SEPTRA DS) 800-160 MG tablet; Take 1 tablet by mouth 2 (two) times daily.  Dispense: 20 tablet; Refill: 0  4. Peripheral edema She is currently taking HCTZ 25 mg daily. She will begin HCTZ 50 mg as prescribed to decrease  edema.  5. Follow up He will follow up in 2 months.   Meds ordered this encounter  Medications  . sulfamethoxazole-trimethoprim (BACTRIM DS,SEPTRA DS) 800-160 MG tablet    Sig: Take 1 tablet by mouth 2 (two) times daily.    Dispense:  20 tablet    Refill:  0   Raliegh Ip,  MSN, FNP-BC Patient [facility] [address] [place], [place] [address] [phone]

## 2018-03-22 NOTE — Patient Instructions (Signed)
Sulfamethoxazole; Trimethoprim, SMX-TMP oral suspension What is this medicine? SULFAMETHOXAZOLE; TRIMETHOPRIM or SMX-TMP (suhl fuh meth OK suh zohl; trye METH oh prim) is a combination of a sulfonamide antibiotic and a second antibiotic, trimethoprim. It is used to treat or prevent certain kinds of bacterial infections.It will not work for colds, flu, or other viral infections. This medicine may be used for other purposes; ask your health care provider or pharmacist if you have questions. COMMON BRAND NAME(S): Septra, Sulfatrim, Sulfatrim Pediatric, Sultrex Pediatric What should I tell my health care provider before I take this medicine? They need to know if you have any of these conditions: -anemia -asthma -being treated with anticonvulsants -if you frequently drink alcohol containing drinks -kidney disease -liver disease -low level of folic acid or glucose-6-phosphate dehydrogenase -poor nutrition or malabsorption -porphyria -severe allergies -thyroid disorder -an unusual or allergic reaction to sulfamethoxazole, trimethoprim, sulfa drugs, other medicines, foods, dyes, or preservatives -pregnant or trying to get pregnant -breast-feeding How should I use this medicine? Take this suspension by mouth. Follow the directions on the prescription label. Shake the bottle well before taking. Use a specially marked spoon or container to measure your medicine. Ask your pharmacist if you do not have one. Household spoons are not accurate. Take your doses at regular intervals. Do not take more medicine than directed. Talk to your pediatrician regarding the use of this medicine in children. Special care may be needed. While this drug may be prescribed for children as young as 2 months of age for selected conditions, precautions do apply. Overdosage: If you think you have taken too much of this medicine contact a poison control center or emergency room at once. NOTE: This medicine is only for you. Do not  share this medicine with others. What if I miss a dose? If you miss a dose, take it as soon as you can. If it is almost time for your next dose, take only that dose. Do not take double or extra doses. What may interact with this medicine? Do not take this medicine with any of the following medications -aminobenzoate potassium -dofetilide -metronidazole This medicine may also interact with the following medications -ACE inhibitors like benazepril, enalapril, lisinopril, and ramipril -birth control pills -cyclosporine -digoxin -diuretics -indomethacin -medicines for diabetes -methenamine -methotrexate -phenytoin -potassium supplements -pyrimethamine -sulfinpyrazone -tricyclic antidepressants -warfarin This list may not describe all possible interactions. Give your health care provider a list of all the medicines, herbs, non-prescription drugs, or dietary supplements you use. Also tell them if you smoke, drink alcohol, or use illegal drugs. Some items may interact with your medicine. What should I watch for while using this medicine? Tell your doctor or health care professional if your symptoms do not improve. Drink several glasses of water a day to reduce the risk of kidney problems. Do not treat diarrhea with over the counter products. Contact your doctor if you have diarrhea that lasts more than 2 days or if it is severe and watery. This medicine can make you more sensitive to the sun. Keep out of the sun. If you cannot avoid being in the sun, wear protective clothing and use a sunscreen. Do not use sun lamps or tanning beds/booths. What side effects may I notice from receiving this medicine? Side effects that you should report to your doctor or health care professional as soon as possible: -allergic reactions like skin rash or hives, swelling of the face, lips, or tongue -breathing problems -fever or chills, sore throat -irregular heartbeat, chest pain -  joint or muscle pain -pain  or difficulty passing urine -red pinpoint spots on skin -redness, blistering, peeling or loosening of the skin, including inside the mouth -unusual bleeding or bruising -unusual weakness or tiredness -yellowing of the eyes or skin Side effects that usually do not require medical attention (report to your doctor or health care professional if they continue or are bothersome): -diarrhea -dizziness -headache -loss of appetite -nausea, vomiting -nervousness This list may not describe all possible side effects. Call your doctor for medical advice about side effects. You may report side effects to FDA at 1-800-FDA-1088. Where should I keep my medicine? Keep out of the reach of children. Store at room temperature between 15 and 25 degrees C (59 and 77 degrees F). Protect from light and moisture. Throw away any unused medicine after the expiration date. NOTE: This sheet is a summary. It may not cover all possible information. If you have questions about this medicine, talk to your doctor, pharmacist, or health care provider.  2018 Elsevier/Gold Standard (2013-04-15 14:37:40)  

## 2018-04-08 ENCOUNTER — Telehealth: Payer: Self-pay

## 2018-04-08 ENCOUNTER — Ambulatory Visit: Payer: Medicaid Other | Admitting: Neurology

## 2018-04-08 DIAGNOSIS — I10 Essential (primary) hypertension: Secondary | ICD-10-CM

## 2018-04-08 MED ORDER — METOPROLOL TARTRATE 25 MG PO TABS: 25.0000 mg | ORAL_TABLET | Freq: Two times a day (BID) | ORAL | 3 refills | Status: DC

## 2018-04-08 MED ORDER — HYDROCHLOROTHIAZIDE 50 MG PO TABS: 50.0000 mg | ORAL_TABLET | Freq: Every day | ORAL | 1 refills | Status: DC

## 2018-04-08 MED ORDER — ATORVASTATIN CALCIUM 10 MG PO TABS: 10.0000 mg | ORAL_TABLET | Freq: Every day | ORAL | 1 refills | Status: DC

## 2018-04-08 MED ORDER — LOSARTAN POTASSIUM 50 MG PO TABS: 50.0000 mg | ORAL_TABLET | Freq: Two times a day (BID) | ORAL | 1 refills | Status: DC

## 2018-04-08 NOTE — Telephone Encounter (Signed)
Medication sent to pharmacy  

## 2018-04-27 ENCOUNTER — Telehealth: Payer: Self-pay | Admitting: *Deleted

## 2018-04-27 ENCOUNTER — Ambulatory Visit: Payer: Medicaid Other | Admitting: Neurology

## 2018-04-27 NOTE — Telephone Encounter (Signed)
No showed new patient appointment. 

## 2018-04-28 ENCOUNTER — Encounter: Payer: Self-pay | Admitting: Neurology

## 2018-05-12 ENCOUNTER — Encounter: Payer: Self-pay | Admitting: Family Medicine

## 2018-05-12 ENCOUNTER — Ambulatory Visit (INDEPENDENT_AMBULATORY_CARE_PROVIDER_SITE_OTHER): Payer: Medicaid Other | Admitting: Family Medicine

## 2018-05-12 VITALS — BP 142/70 | HR 74 | Temp 98.0°F | Ht 61.0 in | Wt 129.0 lb

## 2018-05-12 DIAGNOSIS — I10 Essential (primary) hypertension: Secondary | ICD-10-CM

## 2018-05-12 DIAGNOSIS — R350 Frequency of micturition: Secondary | ICD-10-CM

## 2018-05-12 DIAGNOSIS — Z23 Encounter for immunization: Secondary | ICD-10-CM

## 2018-05-12 DIAGNOSIS — N390 Urinary tract infection, site not specified: Secondary | ICD-10-CM

## 2018-05-12 DIAGNOSIS — R8290 Unspecified abnormal findings in urine: Secondary | ICD-10-CM

## 2018-05-12 DIAGNOSIS — Z0000 Encounter for general adult medical examination without abnormal findings: Secondary | ICD-10-CM

## 2018-05-12 DIAGNOSIS — Z131 Encounter for screening for diabetes mellitus: Secondary | ICD-10-CM | POA: Diagnosis not present

## 2018-05-12 DIAGNOSIS — Z09 Encounter for follow-up examination after completed treatment for conditions other than malignant neoplasm: Secondary | ICD-10-CM

## 2018-05-12 LAB — POCT URINALYSIS DIP (MANUAL ENTRY)
Bilirubin, UA: NEGATIVE
Blood, UA: NEGATIVE
Glucose, UA: NEGATIVE mg/dL
Ketones, POC UA: NEGATIVE mg/dL
Nitrite, UA: NEGATIVE
Protein Ur, POC: NEGATIVE mg/dL
Spec Grav, UA: 1.015 (ref 1.010–1.025)
Urobilinogen, UA: 0.2 E.U./dL
pH, UA: 7 (ref 5.0–8.0)

## 2018-05-12 LAB — POCT GLYCOSYLATED HEMOGLOBIN (HGB A1C): Hemoglobin A1C: 5.6 % (ref 4.0–5.6)

## 2018-05-12 MED ORDER — SULFAMETHOXAZOLE-TRIMETHOPRIM 800-160 MG PO TABS: 1.0000 | ORAL_TABLET | Freq: Two times a day (BID) | ORAL | 0 refills | Status: DC

## 2018-05-12 NOTE — Progress Notes (Signed)
Follow Up  Subjective:    Patient ID: Lynn [name], female    DOB: 20-Sep-1941, 77 y.o.   MRN: [id number]  Chief Complaint  Patient presents with  . Follow-up    2 month health maintence    HPI  Lynn [name] has a past medical history of Hypertension. She is here today for follow up.   Current Status: Since her last office visit, she is doing well with c/o increased urination at night. She states that se takes HCTZ 50 mg in the morning and one before she goes to bed. She denies visual changes, chest pain, heart palpitations, and falls. She has occasionally headaches and dizziness with position changes. Denies severe headaches, confusion, seizures, double vision, and blurred vision, nausea and vomiting.  She denies fevers, chills, fatigue, recent infections, weight loss, and night sweats. She has not had any falls.   No cough and shortness of breath reported.   No reports of GI problems such as diarrhea, and constipation. She has no reports of blood in stools, dysuria and hematuria.  No depression or anxiety reported.   She denies pain today.   Past Medical History:  Diagnosis Date  . Hypertension     Family History  Problem Relation Age of Onset  . Hypertension Sister     Social History   Socioeconomic History  . Marital status: Widowed    Spouse name: Not on file  . Number of children: Not on file  . Years of education: Not on file  . Highest education level: Not on file  Occupational History  . Not on file  Social Needs  . Financial resource strain: Not on file  . Food insecurity:    Worry: Not on file    Inability: Not on file  . Transportation needs:    Medical: Not on file    Non-medical: Not on file  Tobacco Use  . Smoking status: Never Smoker  . Smokeless tobacco: Never Used  Substance and Sexual Activity  . Alcohol use: No  . Drug use: No  . Sexual activity: Not on file  Lifestyle  . Physical activity:    Days per week: Not on file    Minutes  per session: Not on file  . Stress: Not on file  Relationships  . Social connections:    Talks on phone: Not on file    Gets together: Not on file    Attends religious service: Not on file    Active member of club or organization: Not on file    Attends meetings of clubs or organizations: Not on file    Relationship status: Not on file  . Intimate partner violence:    Fear of current or ex partner: Not on file    Emotionally abused: Not on file    Physically abused: Not on file    Forced sexual activity: Not on file  Other Topics Concern  . Not on file  Social History Narrative  . Not on file    History reviewed. No pertinent surgical history.  Immunization History  Administered Date(s) Administered  . Influenza,inj,Quad PF,6+ Mos 05/12/2018    Current Meds  Medication Sig  . atorvastatin (LIPITOR) 10 MG tablet Take 1 tablet (10 mg total) by mouth daily at 6 PM.  . hydrochlorothiazide (HYDRODIURIL) 25 MG tablet Take 2 tablets (50 mg total) by mouth daily. Make take an additional 50 mg dose before 4:00 pm as needed for acute swelling.  [name]. ibuprofen (ADVIL,MOTRIN) 200 MG  tablet Take 200 mg by mouth every 6 (six) hours as needed.  [name]. losartan (COZAAR) 50 MG tablet Take 1 tablet (50 mg total) by mouth 2 (two) times daily.  . metoprolol tartrate (LOPRESSOR) 25 MG tablet Take 1 tablet (25 mg total) by mouth 2 (two) times daily.    No Known Allergies  BP (!) 142/70 (BP Location: Left Arm, Patient Position: Sitting, Cuff Size: Large)   Pulse 74   Temp 98 F (36.7 C) (Oral)   Ht 5\' 1"  (1.549 m)   Wt 129 lb (58.5 kg)   SpO2 100%   BMI 24.37 kg/m   Review of Systems  Constitutional: Negative.   HENT: Negative.   Eyes: Negative.   Respiratory: Negative.   Cardiovascular: Negative.   Gastrointestinal: Negative.   Endocrine: Negative.   Genitourinary: Negative.   Musculoskeletal: Negative.   Skin: Negative.   Allergic/Immunologic: Negative.   Neurological: Negative.    Hematological: Negative.   Psychiatric/Behavioral: Negative.    Objective:   Physical Exam  Constitutional: She is oriented to person, place, and time. She appears well-developed and well-nourished.  HENT:  Head: Normocephalic and atraumatic.  Right Ear: External ear normal.  Left Ear: External ear normal.  Nose: Nose normal.  Mouth/Throat: Oropharynx is clear and moist.  Eyes: Pupils are equal, round, and reactive to light. Conjunctivae and EOM are normal.  Neck: Normal range of motion. Neck supple.  Cardiovascular: Normal rate, regular rhythm, normal heart sounds and intact distal pulses.  Pulmonary/Chest: Effort normal and breath sounds normal.  Abdominal: Soft. Bowel sounds are normal.  Musculoskeletal: Normal range of motion.  Neurological: She is alert and oriented to person, place, and time.  Skin: Skin is warm and dry. Capillary refill takes less than 2 seconds.  Psychiatric: She has a normal mood and affect. Her behavior is normal. Judgment and thought content normal.  Nursing note and vitals reviewed.  Assessment & Plan:   1. Urine frequency She has been taking HCTZ 50 mg before she goes to bed. thoroughly educated her and family member the importance of taking HCTZ dosage before 4:00 pm every evening. Written instructions given to patient. Verbalized understanding.   2. Essential hypertension Anti-hypertensive medications are effective. Blood pressure is stable today at 142/70. Continue HCTZ, Losartan, and Metoprolol as prescribed.   3. Need for immunization against influenza Injection administered in office today.  - Flu Vaccine QUAD 36+ mos IM  4. Screening for diabetes mellitus Hgb A1c is in normal range at 5.6 today.  She will continue to decrease foods/beverages high in sugars and carbs and follow Heart Healthy or DASH diet. Increase physical activity to at least 30 minutes cardio exercise daily.   - POCT glycosylated hemoglobin (Hb A1C) - POCT urinalysis  dipstick  5. Healthcare maintenance - Lipid Panel  6. Follow up She will follow up in 3 months.   No orders of the defined types were placed in this encounter.   Raliegh IpNatalie Kerri-Anne Haeberle,  MSN, FNP-C Patient Care [facility] [address]  [place], [place] [id number] [phone]

## 2018-05-12 NOTE — Patient Instructions (Signed)
Sulfamethoxazole; Trimethoprim, SMX-TMP tablets What is this medicine? SULFAMETHOXAZOLE; TRIMETHOPRIM or SMX-TMP (suhl fuh meth OK suh zohl; trye METH oh prim) is a combination of a sulfonamide antibiotic and a second antibiotic, trimethoprim. It is used to treat or prevent certain kinds of bacterial infections. It will not work for colds, flu, or other viral infections. This medicine may be used for other purposes; ask your health care provider or pharmacist if you have questions. COMMON BRAND NAME(S): Bacter-Aid DS, Bactrim, Bactrim DS, Septra, Septra DS What should I tell my health care provider before I take this medicine? They need to know if you have any of these conditions: -anemia -asthma -being treated with anticonvulsants -if you frequently drink alcohol containing drinks -kidney disease -liver disease -low level of folic acid or glucose-6-phosphate dehydrogenase -poor nutrition or malabsorption -porphyria -severe allergies -thyroid disorder -an unusual or allergic reaction to sulfamethoxazole, trimethoprim, sulfa drugs, other medicines, foods, dyes, or preservatives -pregnant or trying to get pregnant -breast-feeding How should I use this medicine? Take this medicine by mouth with a full glass of water. Follow the directions on the prescription label. Take your medicine at regular intervals. Do not take it more often than directed. Do not skip doses or stop your medicine early. Talk to your pediatrician regarding the use of this medicine in children. Special care may be needed. This medicine has been used in children as young as 2 months of age. Overdosage: If you think you have taken too much of this medicine contact a poison control center or emergency room at once. NOTE: This medicine is only for you. Do not share this medicine with others. What if I miss a dose? If you miss a dose, take it as soon as you can. If it is almost time for your next dose, take only that dose. Do  not take double or extra doses. What may interact with this medicine? Do not take this medicine with any of the following medications: -aminobenzoate potassium -dofetilide -metronidazole This medicine may also interact with the following medications: -ACE inhibitors like benazepril, enalapril, lisinopril, and ramipril -birth control pills -cyclosporine -digoxin -diuretics -indomethacin -medicines for diabetes -methenamine -methotrexate -phenytoin -potassium supplements -pyrimethamine -sulfinpyrazone -tricyclic antidepressants -warfarin This list may not describe all possible interactions. Give your health care provider a list of all the medicines, herbs, non-prescription drugs, or dietary supplements you use. Also tell them if you smoke, drink alcohol, or use illegal drugs. Some items may interact with your medicine. What should I watch for while using this medicine? Tell your doctor or health care professional if your symptoms do not improve. Drink several glasses of water a day to reduce the risk of kidney problems. Do not treat diarrhea with over the counter products. Contact your doctor if you have diarrhea that lasts more than 2 days or if it is severe and watery. This medicine can make you more sensitive to the sun. Keep out of the sun. If you cannot avoid being in the sun, wear protective clothing and use a sunscreen. Do not use sun lamps or tanning beds/booths. What side effects may I notice from receiving this medicine? Side effects that you should report to your doctor or health care professional as soon as possible: -allergic reactions like skin rash or hives, swelling of the face, lips, or tongue -breathing problems -fever or chills, sore throat -irregular heartbeat, chest pain -joint or muscle pain -pain or difficulty passing urine -red pinpoint spots on skin -redness, blistering, peeling or loosening of   the skin, including inside the mouth -unusual bleeding or  bruising -unusually weak or tired -yellowing of the eyes or skin Side effects that usually do not require medical attention (report to your doctor or health care professional if they continue or are bothersome): -diarrhea -dizziness -headache -loss of appetite -nausea, vomiting -nervousness This list may not describe all possible side effects. Call your doctor for medical advice about side effects. You may report side effects to FDA at 1-800-FDA-1088. Where should I keep my medicine? Keep out of the reach of children. Store at room temperature between 20 to 25 degrees C (68 to 77 degrees F). Protect from light. Throw away any unused medicine after the expiration date. NOTE: This sheet is a summary. It may not cover all possible information. If you have questions about this medicine, talk to your doctor, pharmacist, or health care provider.  2018 Elsevier/Gold Standard (2013-04-15 14:38:26)  

## 2018-05-13 ENCOUNTER — Telehealth: Payer: Self-pay

## 2018-05-13 LAB — LIPID PANEL
Chol/HDL Ratio: 2.6 ratio (ref 0.0–4.4)
Cholesterol, Total: 147 mg/dL (ref 100–199)
HDL: 57 mg/dL (ref >39)
LDL Calculated: 74 mg/dL (ref 0–99)
Triglycerides: 81 mg/dL (ref 0–149)
VLDL Cholesterol Cal: 16 mg/dL (ref 5–40)

## 2018-05-13 NOTE — Telephone Encounter (Signed)
-----   Message from Kallie LocksNatalie M Stroud, FNP sent at 05/13/2018 10:30 AM EDT ----- Regarding: "Lab Results" Lyla SonCarrie,   Please inform patient that Lipid levels are within normal range.    Thanks.

## 2018-05-13 NOTE — Telephone Encounter (Signed)
Patient notified

## 2018-05-14 LAB — URINE CULTURE: Organism ID, Bacteria: NO GROWTH

## 2018-05-25 ENCOUNTER — Ambulatory Visit: Payer: Medicaid Other | Admitting: Family Medicine

## 2018-05-28 ENCOUNTER — Ambulatory Visit: Payer: Medicaid Other | Admitting: Internal Medicine

## 2018-06-01 ENCOUNTER — Encounter: Payer: Self-pay | Admitting: Family Medicine

## 2018-06-01 ENCOUNTER — Ambulatory Visit: Payer: Medicaid Other | Attending: Family Medicine | Admitting: Family Medicine

## 2018-06-01 VITALS — BP 158/77 | HR 62 | Temp 98.2°F | Resp 18 | Ht 61.0 in | Wt 130.0 lb

## 2018-06-01 DIAGNOSIS — I10 Essential (primary) hypertension: Secondary | ICD-10-CM

## 2018-06-01 DIAGNOSIS — Z79899 Other long term (current) drug therapy: Secondary | ICD-10-CM | POA: Insufficient documentation

## 2018-06-01 DIAGNOSIS — E871 Hypo-osmolality and hyponatremia: Secondary | ICD-10-CM

## 2018-06-01 MED ORDER — LOSARTAN POTASSIUM 50 MG PO TABS: 50.0000 mg | ORAL_TABLET | Freq: Two times a day (BID) | ORAL | 1 refills | Status: DC

## 2018-06-01 MED ORDER — HYDROCHLOROTHIAZIDE 25 MG PO TABS: ORAL_TABLET | ORAL | 1 refills | Status: DC

## 2018-06-01 MED ORDER — AMLODIPINE BESYLATE 5 MG PO TABS: 5.0000 mg | ORAL_TABLET | Freq: Every day | ORAL | 1 refills | Status: DC

## 2018-06-01 MED ORDER — METOPROLOL TARTRATE 25 MG PO TABS: 25.0000 mg | ORAL_TABLET | Freq: Two times a day (BID) | ORAL | 1 refills | Status: DC

## 2018-06-01 NOTE — Progress Notes (Signed)
Subjective:    Patient ID: Lynn [name], female    DOB: 02-05-1941, 77 y.o.   MRN: [id number]  HPI 77 yo female who is new to the practice.  Patient is accompanied by her daughter who acts as a Nurse, learning disability.  Patient is originally from [place].  Daughter states that her mother was recently in ovarian and had episode of high blood pressure and was given a sublingual pill which brought down her blood pressure.  Daughter cannot recall the name of the medication.  Daughter reports that patient's blood pressure tends to go up and down and therefore her medications are divided into morning and evening doses.  Daughter reports that her mother currently takes Cozaar 50 mg twice daily and metoprolol twice daily.  Patient was taking 50 mg of hydrochlorothiazide but was having increased urinary frequency therefore dose was reduced to 25 mg.  Daughter states that hydrochlorothiazide was continued as patient also has recurrent swelling in her legs.      Through her daughter as interpreter, patient reports that she feels well.  Patient occasionally has dizziness but not at today's visit.  Patient denies any headaches.  Patient denies any chest pain or palpitations.  No abdominal pain.  Patient agrees that she does have swelling in her legs which has been chronic.  Patient denies leg pain.  Patient has some occasional pain in her right inside of the foot but only if she presses on a certain area.  Otherwise she has no foot pain with walking or standing.  Patient does have family history of mother and sister with hypertension.  There is no family history of diabetes, heart attack, stroke or any cancers.  Patient has never smoked.  Patient is widowed.  Patient has had no prior surgeries.  Patient's only medical history has been hypertension and leg edema.  Regarding urinary frequency, patient still gets up once or twice per night to urinate.  Patient denies any back pain or abdominal pain.  No fever or chills.  No  dysuria  Past Medical History:  Diagnosis Date  . Hypertension    Family History  Problem Relation Age of Onset  . Hypertension Sister    Social History   Tobacco Use  . Smoking status: Never Smoker  . Smokeless tobacco: Never Used  Substance Use Topics  . Alcohol use: No  . Drug use: No  No Known Allergies    Review of Systems  Constitutional: Negative for chills, fatigue and fever.  HENT: Negative for congestion and trouble swallowing.   Respiratory: Negative for cough and shortness of breath.   Cardiovascular: Positive for leg swelling. Negative for chest pain and palpitations.  Gastrointestinal: Negative for abdominal pain and nausea.  Genitourinary: Positive for frequency. Negative for dysuria.  Musculoskeletal: Negative for back pain and gait problem.  Neurological: Negative for dizziness and headaches.       Objective:   Physical Exam BP (!) 158/77 (BP Location: Right Arm, Patient Position: Sitting, Cuff Size: Normal)   Pulse 62   Temp 98.2 F (36.8 C) (Oral)   Resp 18   Ht 5\' 1"  (1.549 m)   Wt 130 lb (59 kg)   SpO2 98%   BMI 24.56 kg/m  Vital signs and nursing notes reviewed General- well-nourished, well-developed short framed older female in no acute distress.  Patient is accompanied by her daughter at today's visit. ENT-TMs gray, nares with mild edema, normal oropharynx Neck-supple, no lymphadenopathy, no thyromegaly, no carotid bruit Lungs-clear to auscultation bilaterally  Cardiovascular-regular rate and rhythm Abdomen-soft, nontender Back-no CVA tenderness Extremities- patient with mild, bilateral, nonpitting lower extremity edema/ankle edema      Assessment & Plan:  1. Essential hypertension Patient's blood pressure is elevated at today's visit.  Patient's daughter would like patient to be on an additional medicine to lower her blood pressure but made suggestion of a medicine that could be given as needed when patient's blood pressure is higher.   I discussed with the patient/daughter that I will start a medication for patient to take on a daily basis in addition to her current hydrochlorothiazide, Cozaar and Lopressor.  I did offer to change Cozaar to once per day 100 mg pill which daughter declined as she states that patient's blood pressure goes up and down throughout the day and she would prefer that patient take twice daily pills to keep the blood pressure low all day.  I asked that patient/daughter return in 2 to 3 weeks so the patient's blood pressure can be rechecked and that they should also bring the home monitor and blood pressure diary for review and comparison by clinical pharmacist. - Basic Metabolic Panel - hydrochlorothiazide (HYDRODIURIL) 25 MG tablet; One pill daily to lower blood pressure  Dispense: 90 tablet; Refill: 1 - losartan (COZAAR) 50 MG tablet; Take 1 tablet (50 mg total) by mouth 2 (two) times daily. To lower blood pressure  Dispense: 180 tablet; Refill: 1 - metoprolol tartrate (LOPRESSOR) 25 MG tablet; Take 1 tablet (25 mg total) by mouth 2 (two) times daily. To lower blood pressure  Dispense: 180 tablet; Refill: 1 - amLODipine (NORVASC) 5 MG tablet; Take 1 tablet (5 mg total) by mouth daily. To lower blood pressure  Dispense: 90 tablet; Refill: 1  2. Hyponatremia Patient with hyponatremia on review of prior labs.  It actually appears that at one point patient's hydrochlorothiazide was discontinued by another provider but that after establishing with a new provider, this medication was restarted but it does not appear that the sodium level has been checked since May 10 of this year when the level was 131.  I discussed with daughter and had her also let patient know that hyponatremia may result in dizziness/loss of balance and with severe hyponatremia, patients can actually have seizures.  Patient/daughter did finally agree to have BMP done at today's visit.  Patient will be notified of results and I discussed with the  daughter that the HCTZ may need to be further decreased or change made to a different diuretic based on sodium levels on today's BMP. -BMP  -Patient has already received yearly influenza immunization on review of medical records  An After Visit Summary was printed and given to the patient.  Return in about 3 months (around 08/31/2018) for 2-3 weeks with CPP for BP check; 3 mos PCP. - Basic Metabolic Panel

## 2018-06-01 NOTE — Patient Instructions (Signed)
Hypertension Hypertension is another name for high blood pressure. High blood pressure forces your heart to work harder to pump blood. This can cause problems over time. There are two numbers in a blood pressure reading. There is a top number (systolic) over a bottom number (diastolic). It is best to have a blood pressure below 120/80. Healthy choices can help lower your blood pressure. You may need medicine to help lower your blood pressure if:  Your blood pressure cannot be lowered with healthy choices.  Your blood pressure is higher than 130/80.  Follow these instructions at home: Eating and drinking  If directed, follow the DASH eating plan. This diet includes: ? Filling half of your plate at each meal with fruits and vegetables. ? Filling one quarter of your plate at each meal with whole grains. Whole grains include whole wheat pasta, brown rice, and whole grain bread. ? Eating or drinking low-fat dairy products, such as skim milk or low-fat yogurt. ? Filling one quarter of your plate at each meal with low-fat (lean) proteins. Low-fat proteins include fish, skinless chicken, eggs, beans, and tofu. ? Avoiding fatty meat, cured and processed meat, or chicken with skin. ? Avoiding premade or processed food.  Eat less than 1,500 mg of salt (sodium) a day.  Limit alcohol use to no more than 1 drink a day for nonpregnant women and 2 drinks a day for men. One drink equals 12 oz of beer, 5 oz of wine, or 1 oz of hard liquor. Lifestyle  Work with your doctor to stay at a healthy weight or to lose weight. Ask your doctor what the best weight is for you.  Get at least 30 minutes of exercise that causes your heart to beat faster (aerobic exercise) most days of the week. This may include walking, swimming, or biking.  Get at least 30 minutes of exercise that strengthens your muscles (resistance exercise) at least 3 days a week. This may include lifting weights or pilates.  Do not use any  products that contain nicotine or tobacco. This includes cigarettes and e-cigarettes. If you need help quitting, ask your doctor.  Check your blood pressure at home as told by your doctor.  Keep all follow-up visits as told by your doctor. This is important. Medicines  Take over-the-counter and prescription medicines only as told by your doctor. Follow directions carefully.  Do not skip doses of blood pressure medicine. The medicine does not work as well if you skip doses. Skipping doses also puts you at risk for problems.  Ask your doctor about side effects or reactions to medicines that you should watch for. Contact a doctor if:  You think you are having a reaction to the medicine you are taking.  You have headaches that keep coming back (recurring).  You feel dizzy.  You have swelling in your ankles.  You have trouble with your vision. Get help right away if:  You get a very bad headache.  You start to feel confused.  You feel weak or numb.  You feel faint.  You get very bad pain in your: ? Chest. ? Belly (abdomen).  You throw up (vomit) more than once.  You have trouble breathing. Summary  Hypertension is another name for high blood pressure.  Making healthy choices can help lower blood pressure. If your blood pressure cannot be controlled with healthy choices, you may need to take medicine. This information is not intended to replace advice given to you by your health care   provider. Make sure you discuss any questions you have with your health care provider. Document Released: 02/25/2008 Document Revised: 08/06/2016 Document Reviewed: 08/06/2016 Elsevier Interactive Patient Education  2018 Elsevier Inc. How to Take Your Blood Pressure You can take your blood pressure at home with a machine. You may need to check your blood pressure at home:  To check if you have high blood pressure (hypertension).  To check your blood pressure over time.  To make sure your  blood pressure medicine is working.  Supplies needed: You will need a blood pressure machine, or monitor. You can buy one at a drugstore or online. When choosing one:  Choose one with an arm cuff.  Choose one that wraps around your upper arm. Only one finger should fit between your arm and the cuff.  Do not choose one that measures your blood pressure from your wrist or finger.  Your doctor can suggest a monitor. How to prepare Avoid these things for 30 minutes before checking your blood pressure:  Drinking caffeine.  Drinking alcohol.  Eating.  Smoking.  Exercising.  Five minutes before checking your blood pressure:  Pee.  Sit in a dining chair. Avoid sitting in a soft couch or armchair.  Be quiet. Do not talk.  How to take your blood pressure Follow the instructions that came with your machine. If you have a digital blood pressure monitor, these may be the instructions: 1. Sit up straight. 2. Place your feet on the floor. Do not cross your ankles or legs. 3. Rest your left arm at the level of your heart. You may rest it on a table, desk, or chair. 4. Pull up your shirt sleeve. 5. Wrap the blood pressure cuff around the upper part of your left arm. The cuff should be 1 inch (2.5 cm) above your elbow. It is best to wrap the cuff around bare skin. 6. Fit the cuff snugly around your arm. You should be able to place only one finger between the cuff and your arm. 7. Put the cord inside the groove of your elbow. 8. Press the power button. 9. Sit quietly while the cuff fills with air and loses air. 10. Write down the numbers on the screen. 11. Wait 2-3 minutes and then repeat steps 1-10.  What do the numbers mean? Two numbers make up your blood pressure. The first number is called systolic pressure. The second is called diastolic pressure. An example of a blood pressure reading is "120 over 80" (or 120/80). If you are an adult and do not have a medical condition, use this  guide to find out if your blood pressure is normal: Normal  First number: below 120.  Second number: below 80. Elevated  First number: 120-129.  Second number: below 80. Hypertension stage 1  First number: 130-139.  Second number: 80-89. Hypertension stage 2  First number: 140 or above.  Second number: 90 or above. Your blood pressure is above normal even if only the top or bottom number is above normal. Follow these instructions at home:  Check your blood pressure as often as your doctor tells you to.  Take your monitor to your next doctor's appointment. Your doctor will: ? Make sure you are using it correctly. ? Make sure it is working right.  Make sure you understand what your blood pressure numbers should be.  Tell your doctor if your medicines are causing side effects. Contact a doctor if:  Your blood pressure keeps being high. Get help   right away if:  Your first blood pressure number is higher than 180.  Your second blood pressure number is higher than 120. This information is not intended to replace advice given to you by your health care provider. Make sure you discuss any questions you have with your health care provider. Document Released: 08/21/2008 Document Revised: 08/06/2016 Document Reviewed: 02/15/2016 Elsevier Interactive Patient Education  2018 Elsevier Inc.  

## 2018-06-02 LAB — BASIC METABOLIC PANEL WITH GFR
BUN/Creatinine Ratio: 20 (ref 12–28)
BUN: 17 mg/dL (ref 8–27)
CO2: 25 mmol/L (ref 20–29)
Calcium: 9.5 mg/dL (ref 8.7–10.3)
Chloride: 93 mmol/L — ABNORMAL LOW (ref 96–106)
Creatinine, Ser: 0.83 mg/dL (ref 0.57–1.00)
GFR calc Af Amer: 79 mL/min/1.73
GFR calc non Af Amer: 68 mL/min/1.73
Glucose: 97 mg/dL (ref 65–99)
Potassium: 4.1 mmol/L (ref 3.5–5.2)
Sodium: 136 mmol/L (ref 134–144)

## 2018-06-09 ENCOUNTER — Telehealth: Payer: Self-pay

## 2018-06-09 NOTE — Telephone Encounter (Signed)
Patient was called, no answer, if patient returns call please inform patient of normal bmp lab work.

## 2018-06-09 NOTE — Telephone Encounter (Signed)
Pt's daughter called to request lab results, she verified DOB and understood results. Had no further questions

## 2018-06-09 NOTE — Telephone Encounter (Signed)
-----   Message from Cain Saupeammie Fulp, MD sent at 06/02/2018  3:20 PM EDT ----- Please notify patient/daughter that patient's BMP is within normal and patient now with a normal sodium level of 136 with normal being 1 34-1 44.  Patient's sodium was low at 131 on blood work done 4 months ago

## 2018-06-14 ENCOUNTER — Encounter: Payer: Self-pay | Admitting: Neurology

## 2018-06-14 ENCOUNTER — Telehealth: Payer: Self-pay | Admitting: *Deleted

## 2018-06-14 ENCOUNTER — Telehealth: Payer: Self-pay | Admitting: Neurology

## 2018-06-14 ENCOUNTER — Ambulatory Visit: Payer: Medicaid Other | Admitting: Neurology

## 2018-06-14 VITALS — BP 128/63 | HR 67 | Ht 61.0 in | Wt 129.5 lb

## 2018-06-14 DIAGNOSIS — R413 Other amnesia: Secondary | ICD-10-CM

## 2018-06-14 DIAGNOSIS — F09 Unspecified mental disorder due to known physiological condition: Secondary | ICD-10-CM | POA: Insufficient documentation

## 2018-06-14 HISTORY — DX: Unspecified mental disorder due to known physiological condition: F09

## 2018-06-14 NOTE — Telephone Encounter (Signed)
Patient arrived at 9:10am for her 9am new patient appt.  Dr. Terrace ArabiaYan agreed to still see her.

## 2018-06-14 NOTE — Progress Notes (Signed)
PATIENT: Lynn [name] DOB: Dec 20, 1940  Chief Complaint  Patient presents with  . Memory Loss    MMSE.  She is here with her daughter, Lynn [name], who will be her interpreter today (consent form signed).  She lives with her daughter who has started noticing some memory loss.  [name]. PCP    Cain SaupeFulp, Cammie, MD     HISTORICAL  Lynn [name] is a 77 year old female, seen in request by her primary care physician Dr. Jillyn HiddenFulp, Cammie for evaluation of memory loss, she is accompanied by her daughter Lynn [name] at today's visit.  Initial evaluation was on June 14, 2018. She moved from [place] in 2014, speaks Farsi, history is through interpretation of her daughter  I have reviewed and summarized the referring note from the referring physician.  She has past medical history of hypertension, hyperlipidemia.  She used to work in the office job, is very intelligent, her older sister suffered Parkinson's disease with rapid decline, since beginning of 2019, she was noted to have mild memory loss, tends to repeat her question, forgot her appointment quickly, she is taking care of her daughter, who just gave birth to her child 4 days ago, and also her grandchildren, denied depression anxiety, denies gait abnormality,  Laboratory evaluation showed normal BMP, LDL 74, cholesterol is 147, A1c 5.6,  REVIEW OF SYSTEMS: Full 14 system review of systems performed and notable only for as above All other review of systems were negative.  ALLERGIES: No Known Allergies  HOME MEDICATIONS: Current Outpatient Medications  Medication Sig Dispense Refill  . amLODipine (NORVASC) 5 MG tablet Take 1 tablet (5 mg total) by mouth daily. To lower blood pressure 90 tablet 1  . atorvastatin (LIPITOR) 10 MG tablet Take 1 tablet (10 mg total) by mouth daily at 6 PM. 90 tablet 1  . hydrochlorothiazide (HYDRODIURIL) 25 MG tablet One pill daily to lower blood pressure 90 tablet 1  . ibuprofen (ADVIL,MOTRIN) 200 MG tablet Take 200 mg  by mouth every 6 (six) hours as needed.    [name]. losartan (COZAAR) 50 MG tablet Take 1 tablet (50 mg total) by mouth 2 (two) times daily. To lower blood pressure 180 tablet 1  . metoprolol tartrate (LOPRESSOR) 25 MG tablet Take 1 tablet (25 mg total) by mouth 2 (two) times daily. To lower blood pressure 180 tablet 1   No current facility-administered medications for this visit.     PAST MEDICAL HISTORY: Past Medical History:  Diagnosis Date  . Hypertension   . Memory loss     PAST SURGICAL HISTORY: History reviewed. No pertinent surgical history.  FAMILY HISTORY: Family History  Problem Relation Age of Onset  . Diverticulitis Mother   . Other Father        unsure  . Hypertension Sister     SOCIAL HISTORY: Social History   Socioeconomic History  . Marital status: Widowed    Spouse name: Not on file  . Number of children: 2  . Years of education: some college  . Highest education level: Not on file  Occupational History  . Occupation: Retired  Engineer, productionocial Needs  . Financial resource strain: Not on file  . Food insecurity:    Worry: Not on file    Inability: Not on file  . Transportation needs:    Medical: Not on file    Non-medical: Not on file  Tobacco Use  . Smoking status: Never Smoker  . Smokeless tobacco: Never Used  Substance and Sexual Activity  . Alcohol  use: No  . Drug use: No  . Sexual activity: Not Currently  Lifestyle  . Physical activity:    Days per week: Not on file    Minutes per session: Not on file  . Stress: Not on file  Relationships  . Social connections:    Talks on phone: Not on file    Gets together: Not on file    Attends religious service: Not on file    Active member of club or organization: Not on file    Attends meetings of clubs or organizations: Not on file    Relationship status: Not on file  . Intimate partner violence:    Fear of current or ex partner: Not on file    Emotionally abused: Not on file    Physically abused: Not on  file    Forced sexual activity: Not on file  Other Topics Concern  . Not on file  Social History Narrative   Lives with her daughter.   Right-handed.   No caffeine use.     PHYSICAL EXAM   Vitals:   06/14/18 0921  BP: 128/63  Pulse: 67  Weight: 129 lb 8 oz (58.7 kg)  Height: 5\' 1"  (1.549 m)    Not recorded      Body mass index is 24.47 kg/m.  PHYSICAL EXAMNIATION:  Gen: NAD, conversant, well nourised, obese, well groomed                     Cardiovascular: Regular rate rhythm, no peripheral edema, warm, nontender. Eyes: Conjunctivae clear without exudates or hemorrhage Neck: Supple, no carotid bruits. Pulmonary: Clear to auscultation bilaterally   NEUROLOGICAL EXAM:  MMSE - Mini Mental State Exam 06/14/2018 06/14/2018 01/29/2018  Orientation to time 5 5 5   Orientation to Place 4 4 5   Registration 3 3 3   Attention/ Calculation 5 5 5   Recall 2 2 3   Language- name 2 objects 2 2 1   Language- repeat 1 1 1   Language- follow 3 step command 3 3 3   Language- read & follow direction 1 1 (No Data)  Language-read & follow direction-comments - - Unable to perform due to language deficit  Write a sentence 1 1 1   Copy design 1 1 1   Total score 28 28 -     CRANIAL NERVES: CN II: Visual fields are full to confrontation. Fundoscopic exam is normal with sharp discs and no vascular changes. Pupils are round equal and briskly reactive to light. CN III, IV, VI: extraocular movement are normal. No ptosis. CN V: Facial sensation is intact to pinprick in all 3 divisions bilaterally. Corneal responses are intact.  CN VII: Face is symmetric with normal eye closure and smile. CN VIII: Hearing is normal to rubbing fingers CN IX, X: Palate elevates symmetrically. Phonation is normal. CN XI: Head turning and shoulder shrug are intact CN XII: Tongue is midline with normal movements and no atrophy.  MOTOR: There is no pronator drift of out-stretched arms. Muscle bulk and tone are normal.  Muscle strength is normal.  REFLEXES: Reflexes are 2+ and symmetric at the biceps, triceps, knees, and ankles. Plantar responses are flexor.  SENSORY: Intact to light touch, pinprick, positional sensation and vibratory sensation are intact in fingers and toes.  COORDINATION: Rapid alternating movements and fine finger movements are intact. There is no dysmetria on finger-to-nose and heel-knee-shin.    GAIT/STANCE: Posture is normal. Gait is steady with normal steps, base, arm swing, and turning. Heel and  toe walking are normal. Tandem gait is normal.  Romberg is absent.   DIAGNOSTIC DATA (LABS, IMAGING, TESTING) - I reviewed patient records, labs, notes, testing and imaging myself where available.   ASSESSMENT AND PLAN  Mariangel Ringley is a 77 y.o. female   Mild Cognitive Impairment  MMSE 28/30  MRI brain  Lab evaluation TSH, B12,   Levert Feinstein, M.D. Ph.D.  [facility] [address] [place], [place] [address] Ph: [phone] Fax: [phone]  CC: Cain Saupe, MD

## 2018-06-14 NOTE — Telephone Encounter (Signed)
No showed new patient appointment. 

## 2018-06-14 NOTE — Telephone Encounter (Signed)
Medicaid order sent to [facility]. They will obtain the auth and will reach out to the pt to schedule.  °

## 2018-06-15 ENCOUNTER — Telehealth: Payer: Self-pay | Admitting: *Deleted

## 2018-06-15 LAB — VITAMIN B12: Vitamin B-12: 510 pg/mL (ref 232–1245)

## 2018-06-15 LAB — TSH: TSH: 2.63 u[IU]/mL (ref 0.450–4.500)

## 2018-06-15 NOTE — Telephone Encounter (Signed)
[place] to patient's daughter [place] - she is aware of her mother's lab results.

## 2018-06-15 NOTE — Telephone Encounter (Signed)
-----   Message from Levert FeinsteinYijun Yan, MD sent at 06/15/2018  8:40 AM EDT ----- Please call patient for normal laboratory result

## 2018-06-22 ENCOUNTER — Encounter: Payer: Medicaid Other | Admitting: Pharmacist

## 2018-06-29 ENCOUNTER — Ambulatory Visit: Payer: Medicaid Other | Attending: Family Medicine | Admitting: Family Medicine

## 2018-06-29 ENCOUNTER — Encounter: Payer: Self-pay | Admitting: Family Medicine

## 2018-06-29 VITALS — BP 146/81 | HR 58 | Temp 98.3°F | Resp 18 | Ht 61.0 in | Wt 131.0 lb

## 2018-06-29 DIAGNOSIS — Z79899 Other long term (current) drug therapy: Secondary | ICD-10-CM | POA: Diagnosis not present

## 2018-06-29 DIAGNOSIS — J069 Acute upper respiratory infection, unspecified: Secondary | ICD-10-CM | POA: Diagnosis present

## 2018-06-29 DIAGNOSIS — Z760 Encounter for issue of repeat prescription: Secondary | ICD-10-CM | POA: Diagnosis not present

## 2018-06-29 DIAGNOSIS — Z8249 Family history of ischemic heart disease and other diseases of the circulatory system: Secondary | ICD-10-CM | POA: Insufficient documentation

## 2018-06-29 DIAGNOSIS — I10 Essential (primary) hypertension: Secondary | ICD-10-CM | POA: Diagnosis not present

## 2018-06-29 MED ORDER — ATORVASTATIN CALCIUM 10 MG PO TABS: 10.0000 mg | ORAL_TABLET | Freq: Every day | ORAL | 1 refills | Status: DC

## 2018-06-29 MED ORDER — DOXYCYCLINE HYCLATE 100 MG PO TABS: 100.0000 mg | ORAL_TABLET | Freq: Two times a day (BID) | ORAL | 0 refills | Status: AC

## 2018-06-29 MED ORDER — METOPROLOL TARTRATE 25 MG PO TABS: 25.0000 mg | ORAL_TABLET | Freq: Two times a day (BID) | ORAL | 1 refills | Status: DC

## 2018-06-29 MED ORDER — AMLODIPINE BESYLATE 5 MG PO TABS: 5.0000 mg | ORAL_TABLET | Freq: Every day | ORAL | 1 refills | Status: DC

## 2018-06-29 MED ORDER — HYDROCHLOROTHIAZIDE 25 MG PO TABS: ORAL_TABLET | ORAL | 1 refills | Status: DC

## 2018-06-29 MED ORDER — LOSARTAN POTASSIUM 50 MG PO TABS: 50.0000 mg | ORAL_TABLET | Freq: Two times a day (BID) | ORAL | 1 refills | Status: DC

## 2018-06-29 MED ORDER — DOXYCYCLINE HYCLATE 100 MG PO TABS: 100.0000 mg | ORAL_TABLET | Freq: Two times a day (BID) | ORAL | 0 refills | Status: DC

## 2018-06-29 NOTE — Patient Instructions (Signed)
Upper Respiratory Infection, Adult Most upper respiratory infections (URIs) are caused by a virus. A URI affects the nose, throat, and upper air passages. The most common type of URI is often called "the common cold." Follow these instructions at home:  Take medicines only as told by your doctor.  Gargle warm saltwater or take cough drops to comfort your throat as told by your doctor.  Use a warm mist humidifier or inhale steam from a shower to increase air moisture. This may make it easier to breathe.  Drink enough fluid to keep your pee (urine) clear or pale yellow.  Eat soups and other clear broths.  Have a healthy diet.  Rest as needed.  Go back to work when your fever is gone or your doctor says it is okay. ? You may need to stay home longer to avoid giving your URI to others. ? You can also wear a face mask and wash your hands often to prevent spread of the virus.  Use your inhaler more if you have asthma.  Do not use any tobacco products, including cigarettes, chewing tobacco, or electronic cigarettes. If you need help quitting, ask your doctor. Contact a doctor if:  You are getting worse, not better.  Your symptoms are not helped by medicine.  You have chills.  You are getting more short of breath.  You have brown or red mucus.  You have yellow or brown discharge from your nose.  You have pain in your face, especially when you bend forward.  You have a fever.  You have puffy (swollen) neck glands.  You have pain while swallowing.  You have white areas in the back of your throat. Get help right away if:  You have very bad or constant: ? Headache. ? Ear pain. ? Pain in your forehead, behind your eyes, and over your cheekbones (sinus pain). ? Chest pain.  You have long-lasting (chronic) lung disease and any of the following: ? Wheezing. ? Long-lasting cough. ? Coughing up blood. ? A change in your usual mucus.  You have a stiff neck.  You have  changes in your: ? Vision. ? Hearing. ? Thinking. ? Mood. This information is not intended to replace advice given to you by your health care provider. Make sure you discuss any questions you have with your health care provider. Document Released: 02/25/2008 Document Revised: 05/11/2016 Document Reviewed: 12/14/2013 Elsevier Interactive Patient Education  2018 Elsevier Inc.  

## 2018-06-29 NOTE — Progress Notes (Signed)
Subjective:    Patient ID: Lynn [name], female    DOB: 10-30-1940, 77 y.o.   MRN: [id number]   Due to a language barrier, interpreter service was offered which daughter declined.  HPI 77 yo female with hypertension who is seen secondary to recent onset of a cough, sore throat and nasal congestion for the past 3 days.  Patient is accompanied by her daughter who wishes to act as the interpreter.  Daughter states that in the past whenever patient has a cough, her symptoms clear up after she takes a Z-Pak.  Per daughter, the cough is sometimes productive of yellow to green phlegm. Patient has had no fever, no headache or dizziness.  Daughter states that earlier this week she gave her mother a cough medication with an expectorant and this caused patient's blood pressure to be elevated to 180 systolic.  Daughter stopped giving her mother this medication.  Daughter would also like 90-day refills of all of patient's current medications at today's visit.  Daughter states that her mother's blood pressure has been better at home on the current medications.  Daughter states that her mother has had no history of tobacco use and no secondhand smoke exposure.  Past Medical History:  Diagnosis Date  . Hypertension   . Memory loss    Family History  Problem Relation Age of Onset  . Diverticulitis Mother   . Other Father        unsure  . Hypertension Sister    Social History   Tobacco Use  . Smoking status: Never Smoker  . Smokeless tobacco: Never Used  Substance Use Topics  . Alcohol use: No  . Drug use: No  No Known Allergies    Review of Systems  Constitutional: Positive for fatigue. Negative for chills and fever.  HENT: Positive for congestion, postnasal drip, rhinorrhea and sore throat. Negative for ear pain, sinus pressure, sinus pain and trouble swallowing.   Respiratory: Positive for cough. Negative for shortness of breath and wheezing.   Cardiovascular: Negative for chest pain,  palpitations and leg swelling.  Gastrointestinal: Negative for abdominal pain and nausea.  Genitourinary: Negative for dysuria and frequency.  Musculoskeletal: Positive for back pain. Negative for gait problem.  Neurological: Negative for dizziness and headaches.       Objective:   Physical Exam BP (!) 146/81 (BP Location: Left Arm, Patient Position: Sitting, Cuff Size: Normal)   Pulse (!) 58   Temp 98.3 F (36.8 C) (Oral)   Resp 18   Ht 5\' 1"  (1.549 m)   Wt 131 lb (59.4 kg)   SpO2 96%   BMI 24.75 kg/m nurses notes and vital signs reviewed General-well-nourished, well-developed older female in no acute distress sitting on exam table.  Patient was occasional repetitive, non-productive cough ENT- TMs dull, nares with edema of the nasal mucosa with clear to white nasal discharge, patient with posterior pharynx/tonsillar arch edema/erythema, no exudate noted Neck-supple, no lymphadenopathy Cardiovascular-regular rate and rhythm Lungs-clear to auscultation bilaterally with mild decrease in air movement, no increased work of breathing Abdomen-soft, nontender Back-no CVA tenderness Extremities-no edema        Assessment & Plan:  1. URI with cough and congestion Discussed with patient's daughter that with the acute onset of patient's symptoms and findings of lungs being clear on auscultation as well as clear to white nasal drainage, patient's symptoms are most likely viral.  Patient however does have a decrease in oxygen saturation from her baseline.  Patient will be placed  on doxycycline 100 mg twice daily x7 days.  Order also placed for a chest x-ray to be obtained.  Patient should be brought back into the office sooner if symptoms worsen or if there are any concerns.  Daughter reports that patient had increase in blood pressure with the use of an expectorant which is somewhat unusual but patient may take an antihistamine such as Claritin/loratadine to help with nasal congestion or may  take dextromethorphan to help with cough. - doxycycline (VIBRA-TABS) 100 MG tablet; Take 1 tablet (100 mg total) by mouth 2 (two) times daily for 7 days.  Dispense: 14 tablet; Refill: 0 - DG Chest 2 View; Future  2. Medication refill Refill provided of Lipitor for 90-day supply per request of patient's daughter - atorvastatin (LIPITOR) 10 MG tablet; Take 1 tablet (10 mg total) by mouth daily at 6 PM.  Dispense: 90 tablet; Refill: 1  3. Essential hypertension Patient's blood pressure slightly elevated and daughter states that recent use of an expectorant caused an increase in blood pressure but this medication has been discontinued.  New prescriptions provided for amlodipine, hydrochlorothiazide, losartan and metoprolol. - amLODipine (NORVASC) 5 MG tablet; Take 1 tablet (5 mg total) by mouth daily. To lower blood pressure  Dispense: 90 tablet; Refill: 1 - hydrochlorothiazide (HYDRODIURIL) 25 MG tablet; One pill daily to lower blood pressure  Dispense: 90 tablet; Refill: 1 - losartan (COZAAR) 50 MG tablet; Take 1 tablet (50 mg total) by mouth 2 (two) times daily. To lower blood pressure  Dispense: 180 tablet; Refill: 1 - metoprolol tartrate (LOPRESSOR) 25 MG tablet; Take 1 tablet (25 mg total) by mouth 2 (two) times daily. To lower blood pressure  Dispense: 180 tablet; Refill: 1  An After Visit Summary was printed and given to the patient.  Return in about 1 week (around 07/06/2018) for cough if not better.

## 2018-08-09 ENCOUNTER — Ambulatory Visit: Payer: Medicaid Other | Admitting: Family Medicine

## 2018-08-31 ENCOUNTER — Ambulatory Visit: Payer: Medicaid Other | Admitting: Family Medicine

## 2018-09-08 ENCOUNTER — Ambulatory Visit (INDEPENDENT_AMBULATORY_CARE_PROVIDER_SITE_OTHER): Payer: Medicaid Other

## 2018-09-08 ENCOUNTER — Ambulatory Visit (INDEPENDENT_AMBULATORY_CARE_PROVIDER_SITE_OTHER): Payer: Medicaid Other | Admitting: Family Medicine

## 2018-09-08 ENCOUNTER — Encounter (INDEPENDENT_AMBULATORY_CARE_PROVIDER_SITE_OTHER): Payer: Self-pay | Admitting: Family Medicine

## 2018-09-08 DIAGNOSIS — R0781 Pleurodynia: Secondary | ICD-10-CM

## 2018-09-08 DIAGNOSIS — M8580 Other specified disorders of bone density and structure, unspecified site: Secondary | ICD-10-CM | POA: Diagnosis not present

## 2018-09-08 MED ORDER — DICLOFENAC SODIUM 1 % TD GEL: 4.0000 g | Freq: Four times a day (QID) | TRANSDERMAL | 6 refills | Status: DC | PRN

## 2018-09-08 NOTE — Progress Notes (Signed)
   Office Visit Note   Patient: Lynn DoyneMohtaram Shafiei           Date of Birth: 1941/01/16           MRN: [id number] Visit Date: 09/08/2018 Requested by: Cain SaupeFulp, Cammie, MD [address] [place], [place] [address] PCP: Cain SaupeFulp, Cammie, MD  Subjective: Chief Complaint  Patient presents with  . pain left ribs since 09/06/18, worse today    HPI: She is a 77 year old with left rib cage pain.  She is accompanied by her daughter who interprets for her today.  They are originally from [place].  2 days ago while sitting on a couch holding her grandchild, she turned her body to the left and felt something pop in her rib cage.  Immediate pain, and she has had pain ever since then when she coughs, laughs, sneezes or twists.  No shortness of breath.  No previous problems with her ribs.  She does have a history of osteopenia about 8 years ago but 4 years ago her bone density was normal.  She is not taking anything for her current pain.  She has had some issues with memory loss, etiology uncertain.  She has been blood pressure controlled with 4 medications.  She is on Lipitor for hyperlipidemia.                ROS: Other systems are negative.  Objective: Vital Signs: There were no vitals taken for this visit.  Physical Exam:  Ribs: Point tender inferior left rib cage anterior/mid axillary line with no crepitation or step-off.  Imaging: X-rays ribs: No definite fracture seen.  Assessment & Plan: 1.  Left rib cage pain, possible occult fracture -Rib binder for comfort, Voltaren gel as needed.  Repeat x-rays in 2 to 3 weeks if not improving. -Suggested a trial off Lipitor for a couple months to see if it improves her memory.   Follow-Up Instructions: Return in about 3 weeks (around 09/29/2018), or if symptoms worsen or fail to improve.      Procedures: No procedures performed  No notes on file    PMFS History: Patient Active Problem List   Diagnosis Date Noted  . 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 Bradycardia 03/01/2017  . 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 Hyperlipidemia 03/01/2017  . Edema 03/01/2017   Past Medical History:  Diagnosis Date  . Hypertension   . Memory loss     Family History  Problem Relation Age of Onset  . Diverticulitis Mother   . Other Father        unsure  . Hypertension Sister     History reviewed. No pertinent surgical history. Social History   Occupational History  . Occupation: Retired  Tobacco Use  . Smoking status: Never Smoker  . Smokeless tobacco: Never Used  Substance and Sexual Activity  . Alcohol use: No  . Drug use: No  . Sexual activity: Not Currently

## 2018-09-09 ENCOUNTER — Telehealth (INDEPENDENT_AMBULATORY_CARE_PROVIDER_SITE_OTHER): Payer: Self-pay

## 2018-09-09 NOTE — Telephone Encounter (Signed)
Confirmation #:[id number]#:[id number] WPrior Approval #:[id number]#:[id number] Status:APPROVED  Did PA through [employer] - Diclofenac sodium gel 1% approved.

## 2018-09-10 ENCOUNTER — Telehealth (INDEPENDENT_AMBULATORY_CARE_PROVIDER_SITE_OTHER): Payer: Self-pay | Admitting: Family Medicine

## 2018-09-10 NOTE — Telephone Encounter (Signed)
Sp[facility] with Lynn [name] at [facility] to let him know the generic form was authorized yesterday afternoon by Medicaid.  He ran the Rx through again and it did go through today, so he will fill the Rx.  I called and explained this to the patient's daughter, Lynn [name].

## 2018-09-10 NOTE — Telephone Encounter (Signed)
Patient's daughter Minda MeoShanban called advised the [facility] do not have the Diclofenac Sodium Gel. It was discontinued. The pharmacy said they carry the genertic brand and need approval in order to fill the Rx.  The number to contact Minda MeoShanban is [phone]

## 2018-09-23 ENCOUNTER — Ambulatory Visit: Payer: Medicaid Other | Admitting: Family Medicine

## 2018-09-23 ENCOUNTER — Ambulatory Visit: Payer: Medicaid Other | Attending: Family Medicine | Admitting: Family Medicine

## 2018-09-23 ENCOUNTER — Encounter: Payer: Self-pay | Admitting: Family Medicine

## 2018-09-23 VITALS — BP 125/66 | HR 67 | Temp 98.6°F | Resp 18 | Ht 61.0 in | Wt 127.0 lb

## 2018-09-23 DIAGNOSIS — R4189 Other symptoms and signs involving cognitive functions and awareness: Secondary | ICD-10-CM | POA: Diagnosis not present

## 2018-09-23 DIAGNOSIS — Z79899 Other long term (current) drug therapy: Secondary | ICD-10-CM | POA: Diagnosis not present

## 2018-09-23 DIAGNOSIS — M545 Low back pain, unspecified: Secondary | ICD-10-CM

## 2018-09-23 DIAGNOSIS — R413 Other amnesia: Secondary | ICD-10-CM | POA: Diagnosis not present

## 2018-09-23 DIAGNOSIS — I10 Essential (primary) hypertension: Secondary | ICD-10-CM

## 2018-09-23 DIAGNOSIS — G8929 Other chronic pain: Secondary | ICD-10-CM | POA: Diagnosis not present

## 2018-09-23 MED ORDER — METOPROLOL TARTRATE 25 MG PO TABS: 25.0000 mg | ORAL_TABLET | Freq: Two times a day (BID) | ORAL | 1 refills | Status: DC

## 2018-09-23 MED ORDER — LOSARTAN POTASSIUM 50 MG PO TABS: 50.0000 mg | ORAL_TABLET | Freq: Two times a day (BID) | ORAL | 1 refills | Status: DC

## 2018-09-23 NOTE — Progress Notes (Signed)
Subjective:    Patient ID: Lynn DoyneMohtaram Shafiei, female    DOB: May 23, 1941, 78 y.o.   MRN: [id number]   Due to a language barrier, patient's daughter primarily acted as interpreter.  Attempt was made to receive interpretation through [employer] interpretation service however connection was interrupted and lost.  HPI       78 yo female last seen in the office for URI with cough and patient seen in follow-up of hypertension. Patient has also been seen by neurology, Dr. Terrace ArabiaYan,  and diagnosed with a mild cognitive disorder due to patient's issues with memory loss.        At today's visit, patient's daughter states that patient has been taking the losartan and metoprolol for her blood pressure and that her blood pressure has been well controlled.  Patient has not been taking the amlodipine.  Patient had complained about 2 weeks ago of left-sided rib pain and patient states that she took her mother to the orthopedic doctor.  Patient did not have a fracture.  Patient was scheduled to have a bone density test.  Patient states that her mother is interested in having a belt to go around her lower back as patient has had issues chronically with recurrent low back pain and patient has had recent increased complaints of low back pain.  Patient denies radiation of pain down either leg.      Daughter reports that patient continues to have significant issues with memory and recall and that patient had some worsening of symptoms since 1 of her sisters passed away a few weeks ago.  Daughter states that patient will tell her the same story several times as if she is telling it to her for the first time.  Daughter states that she also took patient to a recent visit with immigration regarding possibility of mother obtaining citizenship.  Patient states that her mother was very anxious during this appointment and daughter was given paperwork that can be filled out to see if patient may have a disability that would prevent her from  taking the citizenship test.  Daughter states that when they last saw the neurologist, neurologist had wanted patient to have a MRI of her brain.  Daughter states that her mother did not want to have this test because she was afraid to have the test because when she had a prior MRI in the past it was too noisy which made the patient anxious.  Daughter states that therefore when she was contacted regarding scheduling of the MRI, she canceled the MRI because her mother did not wish to have this done.  Daughter states that she will call neurology regarding possible rescheduling or if other test can be done as well as to set up the follow-up appointment which patient was to have later this month. Past Medical History:  Diagnosis Date  . Hypertension   . Memory loss   History reviewed. No pertinent surgical history.  Family History  Problem Relation Age of Onset  . Diverticulitis Mother   . Other Father        unsure  . Hypertension Sister    Social History   Tobacco Use  . Smoking status: Never Smoker  . Smokeless tobacco: Never Used  Substance Use Topics  . Alcohol use: No  . Drug use: No  No Known Allergies    Review of Systems  Constitutional: Positive for fatigue. Negative for chills and fever.  HENT: Negative for sore throat and trouble swallowing.   Respiratory:  Positive for shortness of breath (With exertion). Negative for cough.   Cardiovascular: Negative for chest pain, palpitations and leg swelling.  Gastrointestinal: Negative for abdominal pain, blood in stool, constipation, diarrhea and nausea.  Genitourinary: Negative for dysuria and frequency.  Musculoskeletal: Positive for arthralgias and back pain.  Neurological: Negative for dizziness, light-headedness and headaches.       Objective:   Physical Exam BP 125/66 (BP Location: Left Arm, Patient Position: Sitting, Cuff Size: Normal)   Pulse 67   Temp 98.6 F (37 C) (Oral)   Resp 18   Ht 5\' 1"  (1.549 m)   Wt 127 lb  (57.6 kg)   SpO2 97%   BMI 24.00 kg/m Nurse's notes and vital signs reviewed General-small framed, well-nourished older female in no acute distress sitting on exam table, patient is accompanied by her daughter who acts as interpreter (interpretation connection was lost with [employer] video/audio system) ENT-TMs gray, nares with mild edema, normal oropharynx.  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 Essential hypertension Patient's blood pressure appears to be controlled on losartan and metoprolol and refills provided at today's visit. - losartan (COZAAR) 50 MG tablet; Take 1 tablet (50 mg total) by mouth 2 (two) times daily. To lower blood pressure  Dispense: 180 tablet; Refill: 1 - metoprolol tartrate (LOPRESSOR) 25 MG tablet; Take 1 tablet (25 mg total) by mouth 2 (two) times daily. To lower blood pressure  Dispense: 180 tablet; Refill: 1  2. Cognitive impairment Patient neurology note from Dr. Terrace ArabiaYan reviewed.  Patient with cognitive impairment and patient was to have MRI as well as blood work.  Patient did have TSH and vitamin B12 which were both within normal.  Daughter is to call neurology office regarding rescheduling MRI or other test and to schedule for an office neurology follow-up appointment regarding cognitive impairment which daughter reports appears to be worsening.  Immigration paperwork completed that patient will likely not be able to do citizenship exam as patient with cognitive impairment and therefore she would not be able to learn and retain the required information for testing  3. Chronic midline low back pain without sciatica Patient with recent acute on chronic low back pain.   Daughter states that she has upcoming follow-up appointment with orthopedics where she recently had her mother seen secondary to rib pain and patient will have her mother seen by orthopedics regarding the back pain as orthopedics has also ordered DEXA scan.  An After Visit Summary was printed and given to the patient.  Allergies as of 09/23/2018   No Known Allergies     Medication List       Accurate as of September 23, 2018 12:44 PM. Always use your most recent med list.        atorvastatin 10 MG tablet Commonly known as:  LIPITOR Take 1 tablet (10 mg total) by mouth daily at 6 PM.   diclofenac sodium 1 % Gel Commonly known as:  VOLTAREN Apply 4 g topically 4 (four) times daily as needed.   ibuprofen 200 MG tablet Commonly known as:  ADVIL,MOTRIN Take 200 mg by mouth every 6 (six) hours as needed.   losartan 50 MG tablet Commonly known as:  COZAAR Take 1 tablet (50 mg total) by mouth 2 (  two) times daily. To lower blood pressure   metoprolol tartrate 25 MG tablet Commonly known as:  LOPRESSOR Take 1 tablet (25 mg total) by mouth 2 (two) times daily. To lower blood pressure       Return in about 4 months (around 01/22/2019) for fasting labs/bloodwork at follow-up.

## 2018-10-13 ENCOUNTER — Encounter

## 2018-10-13 ENCOUNTER — Other Ambulatory Visit: Payer: Self-pay

## 2018-10-13 ENCOUNTER — Ambulatory Visit: Payer: Medicaid Other | Attending: Family Medicine | Admitting: Physician Assistant

## 2018-10-13 VITALS — BP 150/77 | HR 60 | Temp 98.0°F | Resp 16 | Wt 129.8 lb

## 2018-10-13 DIAGNOSIS — R001 Bradycardia, unspecified: Secondary | ICD-10-CM | POA: Diagnosis not present

## 2018-10-13 DIAGNOSIS — I10 Essential (primary) hypertension: Secondary | ICD-10-CM | POA: Diagnosis not present

## 2018-10-13 DIAGNOSIS — R531 Weakness: Secondary | ICD-10-CM | POA: Diagnosis present

## 2018-10-13 DIAGNOSIS — Z79899 Other long term (current) drug therapy: Secondary | ICD-10-CM | POA: Diagnosis not present

## 2018-10-13 DIAGNOSIS — R5383 Other fatigue: Secondary | ICD-10-CM | POA: Diagnosis not present

## 2018-10-13 NOTE — Progress Notes (Signed)
Patient ID: Lynn [name], female   DOB: [birth year], 78 y.o.   MRN: [id number]        Lynn [name], is a 77 y.o. female  MNO:[id number]  UXY:[id number]  DOB - 18-Nov-1940  Subjective:  Chief Complaint and HPI: Lynn [name] is a 78 y.o. female here today due to symptoms she was having last week.  Starting about a week ago-felt weak and skin looked yellow.  No energy.  Didn't feel hungry X 3 days.  No fever.  No cough.  Daughter is here with her and translating.  The symptoms lasted for 3 days.  Now completely resolved.  No  Vomiting, diarrhea, or nausea.  No syncope.  No abdominal pain.  No urinary s/sx.  Denies any CP.  No dizziness.  No vision changes.     ROS:   Constitutional:  No f/c, No night sweats, No unexplained weight loss. EENT:  No vision changes, No blurry vision, No hearing changes. No mouth, throat, or ear problems.  Respiratory: No cough, No SOB Cardiac: No CP, no palpitations GI:  No abd pain, No N/V/D. GU: No Urinary s/sx Musculoskeletal: No joint pain Neuro: No headache, no dizziness, no motor weakness.  Skin: No rash Endocrine:  No polydipsia. No polyuria.  Psych: Denies SI/HI  No problems updated.  ALLERGIES: No Known Allergies  PAST MEDICAL HISTORY: Past Medical History:  Diagnosis Date  . Hypertension   . Memory loss     MEDICATIONS AT HOME: Prior to Admission medications   Medication Sig Start Date End Date Taking? Authorizing Provider  atorvastatin (LIPITOR) 10 MG tablet Take 1 tablet (10 mg total) by mouth daily at 6 PM. 06/29/18   Fulp, Cammie, MD  diclofenac sodium (VOLTAREN) 1 % GEL Apply 4 g topically 4 (four) times daily as needed. 09/08/18   Hilts, Casimiro Needle, MD  ibuprofen (ADVIL,MOTRIN) 200 MG tablet Take 200 mg by mouth every 6 (six) hours as needed.    [provider]  losartan (COZAAR) 50 MG tablet Take 1 tablet (50 mg total) by mouth 2 (two) times daily. To lower blood pressure 09/23/18   Fulp, Cammie, MD  metoprolol  tartrate (LOPRESSOR) 25 MG tablet Take 1 tablet (25 mg total) by mouth 2 (two) times daily. To lower blood pressure 09/23/18   Fulp, Cammie, MD     Objective:  EXAM:   Vitals:   10/13/18 1608  BP: (!) 150/77  Pulse: 60  Resp: 16  Temp: 98 F (36.7 C)  TempSrc: Oral  SpO2: 97%  Weight: 129 lb 12.8 oz (58.9 kg)    General appearance : A&OX3. NAD. Non-toxic-appearing HEENT: Atraumatic and Normocephalic.  PERRLA. EOM intact.  TM clear B. Mouth-MMM, post pharynx WNL w/o erythema, No PND. Neck: supple, no JVD. No cervical lymphadenopathy. No thyromegaly Chest/Lungs:  Breathing-non-labored, Good air entry bilaterally, breath sounds normal without rales, rhonchi, or wheezing  CVS: S1 S2 regular, no murmurs, gallops, rubs  Abdomen: Bowel sounds present, Non tender and not distended with no gaurding, rigidity or rebound. Extremities: Bilateral Lower Ext shows no edema, both legs are warm to touch with = pulse throughout Neurology:  CN II-XII grossly intact, Non focal.   Psych:  TP linear. J/I WNL. Normal speech. Appropriate eye contact and affect.  Skin:  No Rash  Data Review Lab Results  Component Value Date   HGBA1C 5.6 05/12/2018   HGBA1C 5.7 12/16/2017   HGBA1C 5.6 02/25/2017     Assessment & Plan   1. Weakness Resolved.  Likely was a viral syndrome-All symptoms back to baseline.  EKG with sinus bradycardia.  No ST changes.  - EKG 12-Lead - CBC with Differential/Platelet  2. Essential hypertension Continue current regimen - EKG 12-Lead - Comprehensive metabolic panel -per patient request cardiology referral.  No evidence of cardiac event.    3. Fatigue, unspecified type Resolved.   - TSH - CBC with Differential/Platelet     Patient have been counseled extensively about nutrition and exercise  Return for for next appt with Dr Fulp/schedule sooner if needed.  The patient was given clear instructions to go to ER or return to medical center if symptoms don't  improve, worsen or new problems develop. The patient verbalized understanding. The patient was told to call to get lab results if they haven't heard anything in the next week.     Georgian CoAngela McClung, PA-C [facility] [place], [place] [phone]   10/13/2018, 4:51 PM

## 2018-10-14 ENCOUNTER — Ambulatory Visit: Payer: Medicaid Other | Admitting: Neurology

## 2018-10-14 ENCOUNTER — Telehealth: Payer: Self-pay | Admitting: Family Medicine

## 2018-10-14 ENCOUNTER — Telehealth: Payer: Self-pay | Admitting: *Deleted

## 2018-10-14 LAB — CBC WITH DIFFERENTIAL/PLATELET
Basophils Absolute: 0.1 10*3/uL (ref 0.0–0.2)
Basos: 1 %
EOS (ABSOLUTE): 0.2 10*3/uL (ref 0.0–0.4)
Eos: 3 %
Hematocrit: 35.1 % (ref 34.0–46.6)
Hemoglobin: 12.1 g/dL (ref 11.1–15.9)
Immature Grans (Abs): 0 10*3/uL (ref 0.0–0.1)
Immature Granulocytes: 0 %
LYMPHS: 34 %
Lymphocytes Absolute: 2.8 10*3/uL (ref 0.7–3.1)
MCH: 29.6 pg (ref 26.6–33.0)
MCHC: 34.5 g/dL (ref 31.5–35.7)
MCV: 86 fL (ref 79–97)
Monocytes Absolute: 0.5 10*3/uL (ref 0.1–0.9)
Monocytes: 7 %
Neutrophils Absolute: 4.5 10*3/uL (ref 1.4–7.0)
Neutrophils: 55 %
Platelets: 306 10*3/uL (ref 150–450)
RBC: 4.09 x10E6/uL (ref 3.77–5.28)
RDW: 12.1 % (ref 11.7–15.4)
WBC: 8.2 10*3/uL (ref 3.4–10.8)

## 2018-10-14 LAB — COMPREHENSIVE METABOLIC PANEL
ALT: 20 IU/L (ref 0–32)
AST: 26 IU/L (ref 0–40)
Albumin/Globulin Ratio: 1.7 (ref 1.2–2.2)
Albumin: 4.1 g/dL (ref 3.7–4.7)
Alkaline Phosphatase: 72 IU/L (ref 39–117)
BUN/Creatinine Ratio: 20 (ref 12–28)
BUN: 17 mg/dL (ref 8–27)
Bilirubin Total: 0.3 mg/dL (ref 0.0–1.2)
CO2: 27 mmol/L (ref 20–29)
CREATININE: 0.87 mg/dL (ref 0.57–1.00)
Calcium: 9.2 mg/dL (ref 8.7–10.3)
Chloride: 94 mmol/L — ABNORMAL LOW (ref 96–106)
GFR calc Af Amer: 74 mL/min/{1.73_m2} (ref >59)
GFR, EST NON AFRICAN AMERICAN: 64 mL/min/{1.73_m2} (ref >59)
GLUCOSE: 91 mg/dL (ref 65–99)
Globulin, Total: 2.4 g/dL (ref 1.5–4.5)
Potassium: 3.9 mmol/L (ref 3.5–5.2)
Sodium: 135 mmol/L (ref 134–144)
Total Protein: 6.5 g/dL (ref 6.0–8.5)

## 2018-10-14 LAB — TSH: TSH: 2.66 u[IU]/mL (ref 0.450–4.500)

## 2018-10-14 NOTE — Telephone Encounter (Signed)
Patient's daughter called to see if they could be referred to a different neurology office because they are unable to get a soon appointment where they were currently referred to. Please follow up.

## 2018-10-14 NOTE — Telephone Encounter (Signed)
The patient was asked to arrive at 1pm for her 1:30pm appt today. She did not arrive until 1:45pm.  She needed a memory screen so we were unable to see her this late.  Also, the patient had not had her brain MRI yet.  Part of this appt was to further review her MRI results.  Her daughter, who brought her to the appt, was provided [facility]'s phone number to contact for the MRI.  Our staff offered to get the patient rescheduled with Dr. Terrace Arabia.  The daughter stated her mother did not want to have the MRI.  The daughter also informed our front desk that she was going to ask her PCP to refer the patient to another neurologist since we were unable to see her late today.  Appt history: The patient no showed her first new patient appt.  The new patient appt was rescheduled to a later date.  She was asked to arrive at 8:30am to check-in for her 9am appt.  She arrived at 9:10am.  Dr. Terrace Arabia had extra time in her schedule that day and was able to see her then but we explained that we are not always to see patient's late.

## 2018-10-15 ENCOUNTER — Telehealth: Payer: Self-pay

## 2018-10-15 ENCOUNTER — Encounter: Payer: Self-pay | Admitting: Neurology

## 2018-10-15 NOTE — Telephone Encounter (Signed)
Contacted pt daughter to go over lab results pt daughter doesn't have any questions or concerns

## 2018-10-19 ENCOUNTER — Other Ambulatory Visit: Payer: Self-pay | Admitting: Family Medicine

## 2018-10-19 ENCOUNTER — Telehealth: Payer: Self-pay | Admitting: Family Medicine

## 2018-10-19 DIAGNOSIS — R4189 Other symptoms and signs involving cognitive functions and awareness: Secondary | ICD-10-CM

## 2018-10-19 NOTE — Telephone Encounter (Signed)
Notify patient/daughter that referral was placed

## 2018-10-19 NOTE — Telephone Encounter (Signed)
Patients Daughter called to request a new referral to Neurology, she wants to be seen at [facility], please follow up

## 2018-10-19 NOTE — Progress Notes (Signed)
Patient ID: Lynn [name], female   DOB: 05-Mar-1941, 78 y.o.   MRN: [id number]   Message left by patient's daughter that she would like a referral for patient to see a Neurologist at [facility].

## 2018-10-21 ENCOUNTER — Encounter: Payer: Self-pay | Admitting: Neurology

## 2018-10-27 ENCOUNTER — Ambulatory Visit: Payer: Medicaid Other | Admitting: Neurology

## 2018-10-27 ENCOUNTER — Encounter: Payer: Self-pay | Admitting: Neurology

## 2018-10-27 ENCOUNTER — Encounter

## 2018-10-27 ENCOUNTER — Other Ambulatory Visit: Payer: Self-pay

## 2018-10-27 VITALS — BP 118/60 | HR 67 | Ht 61.0 in | Wt 128.0 lb

## 2018-10-27 DIAGNOSIS — F0390 Unspecified dementia without behavioral disturbance: Secondary | ICD-10-CM

## 2018-10-27 DIAGNOSIS — F03B0 Unspecified dementia, moderate, without behavioral disturbance, psychotic disturbance, mood disturbance, and anxiety: Secondary | ICD-10-CM

## 2018-10-27 DIAGNOSIS — R413 Other amnesia: Secondary | ICD-10-CM | POA: Diagnosis not present

## 2018-10-27 NOTE — Progress Notes (Signed)
NEUROLOGY CONSULTATION NOTE  Lynn [name] MRN: [id number] DOB: August 18, 1941  Referring provider: Dr. Cain Saupe Primary care provider: Dr. Cain Saupe  Reason for consult:  Memory loss  Dear Dr Jillyn Hidden:  Thank you for your kind referral of Lynn [name] for consultation of the above symptoms. Although her history is well known to you, please allow me to reiterate it for the purpose of our medical record. The patient was accompanied to the clinic by her daughter who also provides collateral information and translates in [place]. They declined a medical interpreter (consent form signed). Records and images were personally reviewed where available.  HISTORY OF PRESENT ILLNESS: This is a pleasant 78 year old right-handed woman with a history of hypertension, hyperlipidemia, presenting for evaluation of worsening memory. She moved to [place] from [place] after her husband passed away 6 years ago. Her daughter reports that her husband had been taking care of her and he had mentioned memory issues in the past. When she moved here 6 years ago, her daughter noticed difficulty managing her bills, forgetting doctor appointments, and potentially missing medications. Her daughter would find pills on the floor and has started cleaning the house constantly because she has 2 young children. Her daughter started managing bills 4-5 years ago. She has not been driving since moving to the [place]. For the past 2-3 years, her daughter reports "everyday something is burning on the stove." She forgets to turn the stove or oven off. Over the past month, she would repeat the same conversation within 2 hours. She is independent with dressing and bathing. Her daughter reports that since moving here 6 years ago, she would cry 2-3 times a day due to incidents from her past. She has noticed more depression since her sister passed away 2 months ago, sister had Parkinson's disease and Alzheimer's disease. Her daughter is  worried that the medications her aunt took made her symptoms worse. She had seen neurologist Dr. Terrace Arabia within the past year, most recent MMSE 28/30 in September 2019. MRI brain was recommended, however patient refused due to her experience when she accompanied her sister for an MRI. TSH and B12 normal.   She has occasional moderate headaches with pain in her temples and behind her eyes. She reports dizziness to her daughter. She reports seeing flashing lights/stars in her vision on a daily basis, recent eye exam was normal. She has neck and back pain. She has occasional tingling in both hands. No nausea/vomiting, dysarthria/dysphagia, bowel/bladder dysfunction, tremors. Her daughter thinks she cannot smell things because she does not notice something is burning until later on. Sleep is sometimes good, sometimes bad, no wandering behavior. Her daughter reports a possible viral illness 2 weeks ago where for 3-4 days she was lethargic, no cold symptoms, she was back to normal after.  Laboratory Data: Lab Results  Component Value Date   TSH 2.660 10/13/2018   Lab Results  Component Value Date   VITAMINB12 510 06/14/2018     PAST MEDICAL HISTORY: Past Medical History:  Diagnosis Date  . Hypertension   . Memory loss     PAST SURGICAL HISTORY: No past surgical history on file.  MEDICATIONS: Current Outpatient Medications on File Prior to Visit  Medication Sig Dispense Refill  . atorvastatin (LIPITOR) 10 MG tablet Take 1 tablet (10 mg total) by mouth daily at 6 PM. 90 tablet 1  . diclofenac sodium (VOLTAREN) 1 % GEL Apply 4 g topically 4 (four) times daily as needed. 500 g 6  .  ibuprofen (ADVIL,MOTRIN) 200 MG tablet Take 200 mg by mouth every 6 (six) hours as needed.    [name]. losartan (COZAAR) 50 MG tablet Take 1 tablet (50 mg total) by mouth 2 (two) times daily. To lower blood pressure 180 tablet 1  . metoprolol tartrate (LOPRESSOR) 25 MG tablet Take 1 tablet (25 mg total) by mouth 2 (two) times  daily. To lower blood pressure 180 tablet 1   No current facility-administered medications on file prior to visit.     ALLERGIES: No Known Allergies  FAMILY HISTORY: Family History  Problem Relation Age of Onset  . Diverticulitis Mother   . Other Father        unsure  . Hypertension Sister     SOCIAL HISTORY: Social History   Socioeconomic History  . Marital status: Widowed    Spouse name: Not on file  . Number of children: 2  . Years of education: some college  . Highest education level: Not on file  Occupational History  . Occupation: Retired  Engineer, productionocial Needs  . Financial resource strain: Not on file  . Food insecurity:    Worry: Not on file    Inability: Not on file  . Transportation needs:    Medical: Not on file    Non-medical: Not on file  Tobacco Use  . Smoking status: Never Smoker  . Smokeless tobacco: Never Used  Substance and Sexual Activity  . Alcohol use: No  . Drug use: No  . Sexual activity: Not Currently  Lifestyle  . Physical activity:    Days per week: Not on file    Minutes per session: Not on file  . Stress: Not on file  Relationships  . Social connections:    Talks on phone: Not on file    Gets together: Not on file    Attends religious service: Not on file    Active member of club or organization: Not on file    Attends meetings of clubs or organizations: Not on file    Relationship status: Not on file  . Intimate partner violence:    Fear of current or ex partner: Not on file    Emotionally abused: Not on file    Physically abused: Not on file    Forced sexual activity: Not on file  Other Topics Concern  . Not on file  Social History Narrative   Lives with her daughter.   Right-handed.   No caffeine use.    REVIEW OF SYSTEMS: Constitutional: No fevers, chills, or sweats, no generalized fatigue, change in appetite Eyes: No visual changes, double vision, eye pain Ear, nose and throat: No hearing loss, ear pain, nasal congestion,  sore throat Cardiovascular: No chest pain, palpitations Respiratory:  No shortness of breath at rest or with exertion, wheezes GastrointestinaI: No nausea, vomiting, diarrhea, abdominal pain, fecal incontinence Genitourinary:  No dysuria, urinary retention or frequency Musculoskeletal:  +neck pain, back pain Integumentary: No rash, pruritus, skin lesions Neurological: as above Psychiatric: + depression, insomnia, anxiety Endocrine: No palpitations, fatigue, diaphoresis, mood swings, change in appetite, change in weight, increased thirst Hematologic/Lymphatic:  No anemia, purpura, petechiae. Allergic/Immunologic: no itchy/runny eyes, nasal congestion, recent allergic reactions, rashes  PHYSICAL EXAM: Vitals:   10/27/18 0939  BP: 118/60  Pulse: 67  SpO2: 97%   General: No acute distress Head:  Normocephalic/atraumatic Eyes: Fundoscopic exam shows bilateral sharp discs, no vessel changes, exudates, or hemorrhages Neck: supple, no paraspinal tenderness, full range of motion Back: No paraspinal tenderness Heart:  regular rate and rhythm Lungs: Clear to auscultation bilaterally. Vascular: No carotid bruits. Skin/Extremities: No rash, no edema Neurological Exam: Mental status: alert and oriented to person, city/state, year. No dysarthria or aphasia, Fund of knowledge is reduced.  Recent and remote memory are impaired.  Attention and concentration are reduced.    Able to name objects, difficulty repeating. MMSE - Mini Mental State Exam 10/27/2018 06/14/2018 06/14/2018  Orientation to time 2 5 5   Orientation to Place 2 4 4   Registration 3 3 3   Attention/ Calculation 0 5 5  Recall 2 2 2   Language- name 2 objects 2 2 2   Language- repeat 0 1 1  Language- follow 3 step command 1 3 3   Language- read & follow direction 1 1 1   Language-read & follow direction-comments - - -  Write a sentence 1 1 1   Copy design 0 1 1  Total score 14 28 28    Cranial nerves: CN I: not tested CN II: pupils  equal, round and reactive to light, visual fields intact, fundi unremarkable. CN III, IV, VI:  full range of motion, no nystagmus, no ptosis CN V: decreased sensation to pin on right V2, intact temperature CN VII: upper and lower face symmetric CN VIII: hearing intact to finger rub CN IX, X: gag intact, uvula midline CN XI: sternocleidomastoid and trapezius muscles intact CN XII: tongue midline Bulk & Tone: normal, no cogwheeling, no fasciculations. Motor: 5/5 throughout with no pronator drift. Sensation: decreased cold and pin on left LE, intact to all modalities on both UE, intact vibration and joint position sense. Romberg test negative Deep Tendon Reflexes: +2 throughout, no ankle clonus Plantar responses: downgoing bilaterally Cerebellar: no incoordination on finger to nose testing Gait: narrow-based and steady, able to tandem walk adequately. Tremor: none  IMPRESSION: This is a pleasant 78 year old right-handed woman with a history of hypertension, hyperlipidemia, presenting for evaluation of worsening memory. Her neurological exam shows subjective sensory changes as noted above, MMSE today 14/30 (28/30 in September 2019 at [facility]). Her daughter reports difficulties with complex tasks, exam and history consistent with a history of moderate dementia, likely due to Alzheimer's disease. She refuses to do an MRI brain, we discussed doing a head CT without contrast to assess for underlying structural abnormality. Diagnosis, prognosis, and management discussed with daughter and patient, we discussed the role of cholinesterase inhibitors in dementia, her daughter would like to do her research first. Continue to monitor mood, we discussed depression and dementia, her daughter would like to hold off on medication for now. We discussed having her daughter manage her medications at this point. She does not drive. Would either stop cooking or have family present at all times when she uses the  stove. Follow-up in 6 months, they know to call for any changes.   Thank you for allowing me to participate in the care of this patient. Please do not hesitate to call for any questions or concerns.   Patrcia Dolly, M.D.  CC: Dr. Jillyn Hidden

## 2018-10-27 NOTE — Patient Instructions (Addendum)
1. Schedule head CT without contrast  We have sent a referral to [facility] for your CT and they will call you directly to schedule your appt. They are located at [address] [facility]. If you need to contact them directly please call [phone].   2. The medication that can help slow down the worsening of memory is called Donepezil (Aricept). Please let us know if this is something you would like to start  3. Continue to monitor mood, depression is very common in dementia  4. Recommend daughter start taking care of the medications  5. Follow-up in 6 months, call for any changes  FALL PRECAUTIONS: Be cautious when walking. Scan the area for obstacles that may increase the risk of trips and falls. When getting up in the mornings, sit up at the edge of the bed for a few minutes before getting out of bed. Consider elevating the bed at the head end to avoid drop of blood pressure when getting up. Walk always in a well-lit room (use night lights in the walls). Avoid area rugs or power cords from appliances in the middle of the walkways. Use a walker or a cane if necessary and consider physical therapy for balance exercise. Get your eyesight checked regularly.  HOME SAFETY: Consider the safety of the kitchen when operating appliances like stoves, microwave oven, and blender. Consider having supervision and share cooking responsibilities until no longer able to participate in those. Accidents with firearms and other hazards in the house should be identified and addressed as well.  ABILITY TO BE LEFT ALONE: If patient is unable to contact 911 operator, consider using LifeLine, or when the need is there, arrange for someone to stay with patients. Smoking is a fire hazard, consider supervision or cessation. Risk of wandering should be assessed by caregiver and if detected at any point, supervision and safe proof recommendations should be instituted.  MEDICATION SUPERVISION: Inability to  self-administer medication needs to be constantly addressed. Implement a mechanism to ensure safe administration of the medications.  RECOMMENDATIONS FOR ALL PATIENTS WITH MEMORY PROBLEMS: 1. Continue to exercise (Recommend 30 minutes of walking everyday, or 3 hours every week) 2. Increase social interactions - continue going to [place] and enjoy social gatherings with friends and family 3. Eat healthy, avoid fried foods and eat more fruits and vegetables 4. Maintain adequate blood pressure, blood sugar, and blood cholesterol level. Reducing the risk of stroke and cardiovascular disease also helps promoting better memory. 5. Avoid stressful situations. Live a simple life and avoid aggravations. Organize your time and prepare for the next day in anticipation. 6. Sleep well, avoid any interruptions of sleep and avoid any distractions in the bedroom that may interfere with adequate sleep quality 7. Avoid sugar, avoid sweets as there is a strong link between excessive sugar intake, diabetes, and cognitive impairment The Mediterranean diet has been shown to help patients reduce the risk of progressive memory disorders and reduces cardiovascular risk. This includes eating fish, eat fruits and green leafy vegetables, nuts like almonds and hazelnuts, walnuts, and also use olive oil. Avoid fast foods and fried foods as much as possible. Avoid sweets and sugar as sugar use has been linked to worsening of memory function.  There is always a concern of gradual progression of memory problems. If this is the case, then we may need to adjust level of care according to patient needs. Support, both to the patient and caregiver, should then be put into place.

## 2018-11-01 NOTE — Progress Notes (Signed)
LMOM for pt's daughter, Curlene Labrum, advising that [employer] paperwork has been completed and placed at front desk for her to pick up at her convenience.  Also advised that last page needs to be filled out at signed either by pt or her daughter.    Copy of paperwork placed for scanning.

## 2018-11-01 NOTE — Progress Notes (Signed)
Daughter came to pick up forms. Daughter also signed and dated last page of forms.

## 2018-11-04 ENCOUNTER — Ambulatory Visit
Admission: RE | Admit: 2018-11-04 | Discharge: 2018-11-04 | Disposition: A | Discharge status: Home / Self Care | Payer: Medicaid Other | Source: Ambulatory Visit | Attending: Family Medicine | Admitting: Family Medicine

## 2018-11-04 DIAGNOSIS — M8580 Other specified disorders of bone density and structure, unspecified site: Secondary | ICD-10-CM

## 2018-11-05 ENCOUNTER — Telehealth (INDEPENDENT_AMBULATORY_CARE_PROVIDER_SITE_OTHER): Payer: Self-pay | Admitting: Family Medicine

## 2018-11-05 DIAGNOSIS — R2681 Unsteadiness on feet: Secondary | ICD-10-CM

## 2018-11-05 DIAGNOSIS — M810 Age-related osteoporosis without current pathological fracture: Secondary | ICD-10-CM

## 2018-11-05 NOTE — Telephone Encounter (Signed)
Bone density test shows osteoporosis in the right femoral neck with T score of -2.5.  Spine bone density looks good at -0.6 and total femur density is borderline at -1.0.  We will check vitamin D level and treat accordingly.

## 2018-11-08 NOTE — Telephone Encounter (Signed)
Charissa Bash (daughter) knows the PT facility will be calling her to schedule an appointment.

## 2018-11-08 NOTE — Telephone Encounter (Signed)
I spoke with the patient's daughter, Charissa Bash: she would like [place] to set up PT for the patient ([facility] facility). Coming next Wednesday (2/26) for vitamin D blood test.

## 2018-11-08 NOTE — Telephone Encounter (Signed)
PT ordered.

## 2018-11-08 NOTE — Addendum Note (Signed)
Addended by: Lillia Carmel on: 11/08/2018 03:16 PM   Modules accepted: Orders

## 2018-11-17 ENCOUNTER — Ambulatory Visit (INDEPENDENT_AMBULATORY_CARE_PROVIDER_SITE_OTHER): Payer: Medicaid Other

## 2018-11-17 ENCOUNTER — Other Ambulatory Visit (INDEPENDENT_AMBULATORY_CARE_PROVIDER_SITE_OTHER): Payer: Self-pay

## 2018-11-17 DIAGNOSIS — M810 Age-related osteoporosis without current pathological fracture: Secondary | ICD-10-CM

## 2018-11-17 NOTE — Progress Notes (Signed)
Vitamin D 25 Hydroxy drawn. She asked, through her daughter's translating, if she could have a lumbar corset. She has had one in the past. OK per Dr. Prince Rome.

## 2018-11-18 ENCOUNTER — Telehealth (INDEPENDENT_AMBULATORY_CARE_PROVIDER_SITE_OTHER): Payer: Self-pay | Admitting: Family Medicine

## 2018-11-18 LAB — VITAMIN D 25 HYDROXY (VIT D DEFICIENCY, FRACTURES): VIT D 25 HYDROXY: 36 ng/mL (ref 30–100)

## 2018-11-18 NOTE — Telephone Encounter (Signed)
Vitamin D is ok, but could be better.  For her bones I recommend:  Vitamin D3 at 2,000 IU daily Vitamin K2 at 100 mcg daily Magnesium at 400 mg daily

## 2018-11-19 NOTE — Telephone Encounter (Signed)
I called the patient's daughter, Charissa Bash, advising her of the results and the vitamins she should start taking routinely.  She wrote this information down and I had her read back what she wrote to make sure she understood correctly.

## 2018-11-25 ENCOUNTER — Ambulatory Visit: Payer: Medicaid Other | Attending: Family Medicine | Admitting: Physical Therapy

## 2018-11-25 ENCOUNTER — Encounter: Payer: Self-pay | Admitting: Physical Therapy

## 2018-11-25 ENCOUNTER — Other Ambulatory Visit: Payer: Self-pay

## 2018-11-25 ENCOUNTER — Ambulatory Visit: Payer: Medicaid Other | Admitting: Physical Therapy

## 2018-11-25 DIAGNOSIS — M5442 Lumbago with sciatica, left side: Secondary | ICD-10-CM | POA: Insufficient documentation

## 2018-11-25 DIAGNOSIS — M6281 Muscle weakness (generalized): Secondary | ICD-10-CM | POA: Diagnosis present

## 2018-11-25 DIAGNOSIS — R262 Difficulty in walking, not elsewhere classified: Secondary | ICD-10-CM | POA: Insufficient documentation

## 2018-11-25 DIAGNOSIS — R2689 Other abnormalities of gait and mobility: Secondary | ICD-10-CM | POA: Insufficient documentation

## 2018-11-25 DIAGNOSIS — M810 Age-related osteoporosis without current pathological fracture: Secondary | ICD-10-CM | POA: Diagnosis not present

## 2018-11-25 DIAGNOSIS — M5441 Lumbago with sciatica, right side: Secondary | ICD-10-CM | POA: Insufficient documentation

## 2018-11-26 NOTE — Patient Instructions (Signed)
Access Code: [facility]  URL: [email or web address]  Date: 11/26/2018  Prepared by: Karie Mainland   Exercises  Supine Lower Trunk Rotation - 10 reps - 2 sets - 10 hold - 2x daily - 7x weekly  Bridge - 10 reps - 2 sets - 10 hold - 2x daily - 7x weekly  Straight Leg Raise - 10 reps - 2 sets - 10 hold - 2x daily - 7x weekly

## 2018-11-26 NOTE — Therapy (Signed)
[facility] [address] [place], [place], [address] Phone: [phone]   Fax:  [phone]  Physical Therapy Evaluation  Patient Details  Name: Lynn [name] MRN: [id number] Date of Birth: 02/01/1941 Referring Provider (PT): Dr. Lavada Mesi    Encounter Date: 11/25/2018  PT End of Session - 11/25/18 1633    Visit Number  1    Number of Visits  4    Date for PT Re-Evaluation  12/30/18    PT Start Time  1550    PT Stop Time  1630    PT Time Calculation (min)  40 min    Activity Tolerance  Patient tolerated treatment well    Behavior During Therapy  [facility] for tasks assessed/performed       Past Medical History:  Diagnosis Date  . Bradycardia 03/01/2017  . Edema 03/01/2017  . 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 Hyperlipidemia 03/01/2017  . Hypertension   . Memory loss   . 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 No pertinent surgical history.  There were no vitals filed for this visit.   Subjective Assessment - 11/25/18 1557    Subjective  Patient presents with daughter as interpreter with diagnosis  of osteoporosis.  It is her impression she was here for low back pain.  She denies gait instability.  She has difficulty sitting unsupported due to low back pain.  She has difficulty walking >10 min and needs to walk slowly.  She feels weak in both legs at times.   She has been declining over the past yr.  per daughter. She lives with her daughter and family, needs supervision due to cogniticve deficits, memory.     Patient is accompained by:  Family member   dtr is interpreter   Pertinent History  HTN, osteoporosis T score -2.5     Limitations  Sitting;Lifting;Standing;Walking;House hold activities   moves slowly    How long can you sit comfortably?  5 min unsupported     How long can you stand comfortably?  5 min     How long can you walk comfortably?  5-6 min     Diagnostic tests  none     Patient Stated Goals  Pt would like to have less pain     Currently in Pain?  Yes    Pain Score  6     Pain Location  Back    Pain Orientation  Right;Left;Lower    Pain Descriptors / Indicators  Other (Comment)   pain    Pain Type  Chronic pain    Pain Radiating Towards  both legs     Pain Onset  More than a month ago    Pain Frequency  Intermittent    Aggravating Factors   sitting, standing, walking     Pain Relieving Factors  laying down, Advil , belt     Effect of Pain on Daily Activities  slows her down     Multiple Pain Sites  No         [facility] PT Assessment - 11/26/18 0001      Assessment   Medical Diagnosis  osteoporosis , gait instability     Referring Provider (PT)  Dr. Casimiro Needle Hilts     Onset Date/Surgical Date  --   1 yr   Next MD Visit  unknown    Prior Therapy  No       Precautions   Precautions  None  Restrictions   Weight Bearing Restrictions  No      Balance Screen   Has the patient fallen in the past 6 months  No    Has the patient had a decrease in activity level because of a fear of falling?   Yes    Is the patient reluctant to leave their home because of a fear of falling?   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 [place]    Leisure  family       Cognition   Overall Cognitive Status  History of cognitive impairments - at baseline      Observation/Other Assessments   Focus  on Therapeutic Outcomes (FOTO)   NT due to language barrier       Sensation   Light Touch  Appears Intact      Posture/Postural Control   Posture/Postural Control  Postural limitations    Postural Limitations  Rounded Shoulders;Forward head;Increased thoracic kyphosis;Flexed trunk      AROM   Overall AROM Comments  pain with movements     Lumbar Flexion  NT due to precautions, observed as WFL in sitting prior to eval     Lumbar Extension  50%    Lumbar - Right Side Bend  75%    Lumbar - Left Side Bend  75%    Lumbar - Right Rotation  25%    Lumbar - Left Rotation  25%      Strength   Right Hip Flexion  4/5    Left Hip Flexion  4/5    Right Knee Flexion  4+/5    Right Knee Extension  4+/5    Left Knee Flexion  4+/5    Left Knee Extension  4+/5      Palpation   Spinal mobility  did not want to lie prone     Palpation comment  sore in lumbar paraspinals       Special Tests   Other special tests  --      Bed Mobility   Bed Mobility  Rolling Right;Rolling Left;Right Sidelying to Sit;Supine to Sit    Rolling Right  Independent    Rolling Left  Independent    Right Sidelying to Sit  Independent    Supine to Sit  Supervision/Verbal cueing      Transfers   Five time sit to stand comments   19 sec       Ambulation/Gait   Gait velocity  TBA                 Objective measurements completed on examination: See above findings.      PT Education - 11/26/18 [phone]    Education Details  PT.POC, HEP, avoid flexion/bending at the waist, rotation, fall precautions     Person(s) Educated  Patient    Methods  Explanation;Demonstration;Handout  Comprehension  Verbalized understanding;Need further instruction          PT Long Term Goals - 11/26/18 0813      PT LONG TERM GOAL #1   Title  Pt will consistently perform HEP for strengthening and posture     Baseline  unknown    Time  6    Period  Weeks    Status  New    Target Date  01/06/19      PT LONG TERM  GOAL #2   Title  Pt will be able to walk 15 min comfortably in the community with pain in back 4/10 or less.     Baseline  5 min, pain can be 6/10 or more     Time  6    Period  Weeks    Status  New    Target Date  01/06/19      PT LONG TERM GOAL #3   Title  Pt will be able to demo proper posture and lifting to maintain back health, prevent fractures.     Baseline  unknown     Time  6    Period  Weeks    Status  New    Target Date  01/06/19      PT LONG TERM GOAL #4   Title  Pt will complete balance screen and set goal if warranted.     Baseline  not able to complete on eval     Time  6    Period  Weeks    Status  New    Target Date  01/06/19             Plan - 11/26/18 0817    Clinical Impression Statement  Patient presents with low back pain which has been consistently present with prolonged periods of sitting, standing and walking. She has been declining in her mobility levels over the past year.  She  has decreased endurance, but LE strength 4+/5 throughout.  She  has pain with all trunk motions.  She is reliant on her daughter for transportation so sh emay not be able to come 2 times per week.  Will focus on education about fall prevention and osteoporosis, gentle HEP that will improve her strength and posture.     Personal Factors and Comorbidities  Age;Social Background;Comorbidity 1;Comorbidity 2;Comorbidity 3+    Comorbidities  osteoporosis, HTN, cognitive deficits     Examination-Activity Limitations  Sit;Bed Mobility;Bend;Lift;Squat;Stairs;Locomotion Level;Carry;Stand    Examination-Participation Restrictions  Cleaning;Community Activity;Shop;Meal Prep    Stability/Clinical Decision Making  Evolving/Moderate complexity    Clinical Decision Making  Moderate    Rehab Potential  Good    PT Frequency  2x / week    PT Duration  6 weeks   after 3 visit initial auth   PT Treatment/Interventions  ADLs/Self Care Home Management;Cryotherapy;Stair training;Balance  training;Moist Heat;Electrical Stimulation;DME Instruction;Gait training;Therapeutic exercise;Therapeutic activities;Functional mobility training;Patient/family education;Manual techniques;Passive range of motion;Neuromuscular re-education    PT Next Visit Plan  balance screen, functional strength/posture, education     PT Home Exercise Plan  bridge, SLR and LTR     Consulted and Agree with Plan of Care  Patient;Family member/caregiver    Family Member Consulted  daughter        Patient will benefit from skilled therapeutic intervention in order to improve the following deficits and impairments:  Decreased balance, Decreased endurance, Decreased mobility, Difficulty walking, Improper body mechanics, Decreased range of motion, Decreased activity tolerance, Decreased strength, Increased fascial  restricitons, Impaired flexibility, Impaired UE functional use, Postural dysfunction, Pain  Visit Diagnosis: Osteoporosis without current pathological fracture, unspecified osteoporosis type  Muscle weakness (generalized)  Bilateral low back pain with bilateral sciatica, unspecified chronicity  Difficulty in walking, not elsewhere classified  Other abnormalities of gait and mobility     Problem List Patient Active Problem List   Diagnosis Date Noted  . 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 Bradycardia 03/01/2017  . 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 Hyperlipidemia 03/01/2017  . Edema 03/01/2017    Stassi Fadely 11/26/2018, 8:25 AM  [facility] [address] [place], [place], [address] Phone: [phone]   Fax:  [phone]  Name: Lynn [name] MRN: [id number] Date of Birth: September 30, 1940   Karie Mainland, PT 11/26/18 8:25 AM Phone: [phone] Fax: [phone]

## 2018-11-30 ENCOUNTER — Ambulatory Visit: Payer: Medicaid Other | Admitting: Cardiology

## 2018-12-16 ENCOUNTER — Telehealth: Payer: Self-pay

## 2018-12-16 NOTE — Telephone Encounter (Signed)
   Cardiac Questionnaire:    Since your last visit or hospitalization:    1. Have you been having new or worsening chest pain? NO   2. Have you been having new or worsening shortness of breath? NO 3. Have you been having new or worsening leg swelling, wt gain, or increase in abdominal girth (pants fitting more tightly)? NO   4. Have you had any passing out spells? NO    *A YES to any of these questions would result in the appointment being kept. *If all the answers to these questions are NO, we should indicate that given the current situation regarding the worldwide coronarvirus pandemic, at the recommendation of the CDC, we are looking to limit gatherings in our waiting area, and thus will reschedule their appointment beyond four weeks from today.   _____________   COVID-19 Pre-Screening Questions:  . Do you currently have a fever? NO (yes = cancel and refer to pcp for e-visit) . Have you recently travelled on a cruise, internationally, or to [place], [place], [place], [place], [place], or [place], [place] [employer]) ? NO (yes = cancel, stay home, monitor symptoms, and contact pcp or initiate e-visit if symptoms develop) . Have you been in contact with someone that is currently pending confirmation of Covid19 testing or has been confirmed to have the Covid19 virus?  NO (yes = cancel, stay home, away from tested individual, monitor symptoms, and contact pcp or initiate e-visit if symptoms develop) . Are you currently experiencing fatigue or cough? NO (yes = pt should be prepared to have a mask placed at the time of their visit).   Appointment Cancelled due to Coronavirus:  Called patient in regards to f/u appointment with Lizabeth Leyden, NP on 12/20/2018.   Patient denies having any chest pain, SOB, cough, fever, or any other Sx.   Patient was okay with cancelling appointment and has been made aware that they will be contacted in the near future to reschedule.   Refills have been sent in if needed.   Patient  understands to let us know if they develop any Sx before then. [name]

## 2018-12-20 ENCOUNTER — Ambulatory Visit: Payer: Medicaid Other | Admitting: Cardiology

## 2018-12-28 ENCOUNTER — Ambulatory Visit: Payer: Medicaid Other | Admitting: Neurology

## 2019-01-24 ENCOUNTER — Encounter: Payer: Self-pay | Admitting: Family Medicine

## 2019-01-24 ENCOUNTER — Other Ambulatory Visit: Payer: Self-pay

## 2019-01-24 ENCOUNTER — Ambulatory Visit: Payer: Medicaid Other | Attending: Family Medicine | Admitting: Family Medicine

## 2019-01-24 ENCOUNTER — Ambulatory Visit: Payer: Medicaid Other | Admitting: Family Medicine

## 2019-01-24 DIAGNOSIS — I10 Essential (primary) hypertension: Secondary | ICD-10-CM

## 2019-01-24 DIAGNOSIS — R4189 Other symptoms and signs involving cognitive functions and awareness: Secondary | ICD-10-CM

## 2019-01-24 DIAGNOSIS — F419 Anxiety disorder, unspecified: Secondary | ICD-10-CM | POA: Diagnosis not present

## 2019-01-24 MED ORDER — HYDROXYZINE HCL 10 MG PO TABS: 10.0000 mg | ORAL_TABLET | Freq: Three times a day (TID) | ORAL | 1 refills | Status: DC | PRN

## 2019-01-24 MED ORDER — METOPROLOL TARTRATE 25 MG PO TABS: 25.0000 mg | ORAL_TABLET | Freq: Two times a day (BID) | ORAL | 1 refills | Status: DC

## 2019-01-24 MED ORDER — LOSARTAN POTASSIUM 50 MG PO TABS: 50.0000 mg | ORAL_TABLET | Freq: Two times a day (BID) | ORAL | 1 refills | Status: DC

## 2019-01-24 NOTE — Progress Notes (Signed)
Virtual Visit via Telephone Note  I connected with Lynn [name] on 01/24/19 at 10:30 AM EDT by telephone and verified that I am speaking with the correct person using two identifiers.   I discussed the limitations, risks, security and privacy concerns of performing an evaluation and management service by telephone and the availability of in person appointments. I also discussed with the patient that there may be a patient responsible charge related to this service. The patient expressed understanding and agreed to proceed.  Patient Location: Home Provider Location: Office Others participating in call: Call initiated by Guillermina City[place]avia Richard, RMA who contacted pacific interpreters due to a language barrier as patient speaks, [place]. InterpreterReuel Boom- Daniel ID# [phone]   History of Present Illness:      78 yo female with hypertension, anxiety and moderate cognitive impairment who reports that she continues to have issues with her blood pressure being elevated at times and this usually occurs at night.  Patient reports that when she was in [place] her doctor prescribed a medication, captopril that she could put under her tongue to take as needed for elevated blood pressure in addition to her chronic medicines.  Patient reports that she is compliant with her current metoprolol and losartan.  Patient states that when her blood pressure is elevated she tends to get headaches.  In the past, when she lived in [place], she would have to go to the emergency department 4-5 times per year or more due to headache and elevated blood pressure however now that she lives here with her daughter, her family cannot take her to the emergency department all the time and therefore she usually goes about once a year for headaches and elevated blood pressure.  Unfortunately, I could not understand patient's blood pressure readings as she Telling the interpreter that her blood pressure was 19 or 20.      Patient also with the complaint  of recurrent episodes of feeling anxious/nervous and feeling as if her heart rate is increased.  Patient reports that this is due to the fact that her sister recently passed away.  Patient currently does not take any medication for anxiety.   Past Medical History:  Diagnosis Date  . Bradycardia 03/01/2017  . Edema 03/01/2017  . 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 Hyperlipidemia 03/01/2017  . Hypertension   . Memory loss   . 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 Diverticulitis Mother   . Other Father        unsure  . Hypertension Sister     Social History   Tobacco Use  . Smoking status: Never Smoker  . Smokeless tobacco: Never Used  Substance Use Topics  . Alcohol use: No  . Drug use: No     No Known Allergies   Current Outpatient Medications on File Prior to Visit  Medication Sig Dispense Refill  . aspirin 325 MG tablet Take 325 mg by mouth daily.    [name]. atorvastatin (LIPITOR) 10 MG tablet Take 1 tablet (10 mg total) by mouth daily at 6 PM. 90 tablet 1  . losartan (COZAAR) 50 MG tablet Take 1 tablet (50 mg total) by mouth 2 (two) times daily. To lower blood pressure 180 tablet 1  . metoprolol tartrate (LOPRESSOR) 25 MG tablet Take 1 tablet (25 mg total) by mouth 2 (two) times daily. To lower blood  pressure 180 tablet 1  . Pyridoxine HCl (VITAMIN B-6 PO) Take by mouth.    . Thiamine HCl (VITAMIN B-1 PO) Take by mouth.    . diclofenac sodium (VOLTAREN) 1 % GEL Apply 4 g topically 4 (four) times daily as needed. (Patient not taking: Reported on 01/24/2019) 500 g 6  . ibuprofen (ADVIL,MOTRIN) 200 MG tablet Take 200 mg by mouth every 6 (six) hours as needed.     No current facility-administered medications on file prior to visit.     Review of Systems  Constitutional: Positive for malaise/fatigue (occasional). Negative for chills  and fever.  HENT: Negative for congestion and sore throat.   Respiratory: Negative for cough and shortness of breath.   Cardiovascular: Negative for chest pain and palpitations.  Gastrointestinal: Negative for abdominal pain, constipation, diarrhea, heartburn, nausea and vomiting.  Genitourinary: Negative for dysuria and frequency.  Musculoskeletal: Positive for back pain (chronic). Negative for myalgias.  Neurological: Positive for headaches (when her blood pressure is high). Negative for dizziness.  Psychiatric/Behavioral: Positive for memory loss. Negative for suicidal ideas. The patient is nervous/anxious.    Observations/Objective: No vital signs or physical exam conducted as visit was done via telephone due to limitations/restrictions on in-office visits due to the current COVID-19 pandemic  Assessment and Plan: 1. Essential hypertension Discussed with patient with daughter listening in background that captopril was in the same class of medication as patient's current losartan and that captopril should not be taken as an as needed medicine.  Patient will continue her current losartan and metoprolol and patient was asked to make a follow-up office visit in 4 weeks and to bring her blood pressure monitor for further follow-up of patient's blood pressure. - losartan (COZAAR) 50 MG tablet; Take 1 tablet (50 mg total) by mouth 2 (two) times daily. To lower blood pressure  Dispense: 180 tablet; Refill: 1 - metoprolol tartrate (LOPRESSOR) 25 MG tablet; Take 1 tablet (25 mg total) by mouth 2 (two) times daily. To lower blood pressure  Dispense: 180 tablet; Refill: 1  2. Cognitive impairment On review of chart, patient was seen by her neurologist on 10/27/2018 and patient has had further decline in her mental mini status exam score.  Patient has refused brain MRI due to a bad experience when she accompanied her sister who is having an MRI.  At this time, daughter and patient did not wish to start  medication to help with cognition.  3. Anxiety Patient reports increased episodes of anxiety since her sister passed away.  Prescription provided for hydroxyzine 10 mg that patient can take up to 3 times daily as needed for anxiety.  I also suspect that some of patient's issues with her blood pressure also anxiety related. - hydrOXYzine (ATARAX/VISTARIL) 10 MG tablet; Take 1 tablet (10 mg total) by mouth 3 (three) times daily as needed for anxiety.  Dispense: 30 tablet; Refill: 1   Future Appointments  Date Time Provider Department Center  05/04/2019  4:00 PM Van Clines, MD [facility] None      Follow Up Instructions:Return in about 1 month (around 02/24/2019) for Hypertension/labs.    I discussed the assessment and treatment plan with the patient. The patient was provided an opportunity to ask questions and all were answered. The patient agreed with the plan and demonstrated an understanding of the instructions.   The patient was advised to call back or seek an in-person evaluation if the symptoms worsen or if the condition fails to improve as anticipated.  I provided 22  minutes of non-face-to-face time during this encounter.   Cain Saupe, MD

## 2019-01-24 NOTE — Progress Notes (Signed)
Patient c/o increased HR and getting nervious and her med ran out. Per pt this started 1 wk now.   Per pt she took an extra BP med one day and do not remember the name of the BP and her leg got swollen the next day.

## 2019-04-18 ENCOUNTER — Other Ambulatory Visit: Payer: Self-pay | Admitting: Family Medicine

## 2019-04-18 DIAGNOSIS — I10 Essential (primary) hypertension: Secondary | ICD-10-CM

## 2019-04-18 NOTE — Telephone Encounter (Signed)
1) Medication(s) Requested (by name):. losartan (COZAAR) 50 MG tablet [[id number]]    2) Pharmacy of Choice: [id number] - , [place]   3) Special Requests:    Approved medications will be sent to the pharmacy, we will reach out if there is an issue.  Requests made after 3pm may not be addressed until the following business day!  If a patient is unsure of the name of the medication(s) please note and ask patient to call back when they are able to provide all info, do not send to responsible party until all information is available!

## 2019-04-19 ENCOUNTER — Other Ambulatory Visit: Payer: Self-pay | Admitting: Family Medicine

## 2019-04-19 ENCOUNTER — Telehealth: Payer: Self-pay | Admitting: *Deleted

## 2019-04-19 DIAGNOSIS — I10 Essential (primary) hypertension: Secondary | ICD-10-CM

## 2019-04-19 MED ORDER — LOSARTAN POTASSIUM 50 MG PO TABS: 50.0000 mg | ORAL_TABLET | Freq: Two times a day (BID) | ORAL | 1 refills | Status: DC

## 2019-04-19 MED ORDER — LOSARTAN POTASSIUM 25 MG PO TABS: 50.0000 mg | ORAL_TABLET | Freq: Two times a day (BID) | ORAL | 1 refills | Status: DC

## 2019-04-19 NOTE — Telephone Encounter (Signed)
Patients daughter called stating patient does not have any medication left. -losartan (COZAAR) 50 MG tablet She declined an appointment for hypertension/Labs as directed on her last visit because she does not feel it necessary at this time, stating she has been monitoring her mother BP daily and it has been normal. Please follow up as soon as possible to her pharmacy or with pt. -[id number] - [place], [place]

## 2019-04-19 NOTE — Telephone Encounter (Signed)
Medication refill has been sent to patient's pharmacy

## 2019-04-19 NOTE — Telephone Encounter (Signed)
I called the pharmacy and they have 25 mg of Losartan and will fill the RX using 25 mg tablets and will contact patient in order to pick up her RX

## 2019-04-19 NOTE — Progress Notes (Signed)
Patient ID: Lynn [name], female   DOB: 1940-12-11, 78 y.o.   MRN: [id number]   Spoke with the pharmacist at [facility] and they do not have the 100 mg or 50 mg of losartan but they do have the 25 mg. They can fill the RX using the 25 mg and they will contact patient for pick-up.

## 2019-04-19 NOTE — Telephone Encounter (Signed)
Pharmacy is saying they can not fill this medication due to manufacturer back order and they do not know when they will be able to get the Losartan. Is there any other blood pressure medication you would like patient to take?

## 2019-04-19 NOTE — Telephone Encounter (Signed)
I refilled the medication after getting the refill request earlier today (for the first time). Please call the pharmacy to confirm that they received the refill and if not then please give verbal refill confirmation

## 2019-04-19 NOTE — Telephone Encounter (Signed)
Pharmacy is saying they can not fill this medication due to manufacturer back order and they do not know when they will be able to get the Losartan. Is there any other blood pressure medication you would like patient to take?  

## 2019-04-19 NOTE — Telephone Encounter (Signed)
Spoke with patient daughter who is stating patient is out of her Losartan and have not had it for approx 4-5 days now. Per pt daughter, patient is calling her every 10 minutes saying she is out and what is she suppose to be taking. Staff informed patient daughter that previous messages was sent to provider and provider have not responded. Per pt daughter the pharmacy is telling her they don't have the Losartan and they need patient medication refilled. Staff informed patient daughter that staff will call pharmacy to figure out what's going on with patient Losartan because per pt chart, there should be 1 more refills left.  Staff called pharmacy ([facility]) and was informed that Losartan is on back order with the Manufacturer and no one has it and that is why they have not filled patient medication. Staff came back on the phone and informed patient daughter with this information and daughter stated oh ok but what is her mother suppose to do for tonight dose.   Staff then took another look at patient's chart and noticed that patient should not be out of her Losartan if she is taking her medication as prescribed which is suppose to be Twice a day. Per pt daughter, patient is taking her medication as prescribed but she don't know why she's out of her medications. Per pt daughter, patient is probably taking an extra medication at night if her blood pressure gets high. Per pt daughter she's just guessing with that  information because she's not sure why her mother is out of her Losartan so soon. Per pt daughter, she will check on that when she get home tonight. Staff asked patient daughter if patient is the one that dispense her own medication to herself or does someone else gives it to her. Per pt daughter sometimes pt does it and sometimes she does it.   Staff then suggested to patient daughter that due to it sounding like patient is over taking her blood pressure medication, staff thinks patient needs to schedule appt  with provider to come in to discuss this with the provider. Staff also informed patient daughter that due to all of the unsure information daughter is stating to staff like she's not sure how many tablets patient is taking and not sure if patient other BP med is causing side effect, patient need to discuss all of this with provider. Staff wanted patient daughter to bring patient into office so patient blood pressure can be obtained but daughter insisted that patient do visit via telephone. Per pt daughter with all the Covid 19 things that are happening she prefer her mother do televisit. Staff then asked daughter how will office know what patient blood pressure is and daughter stated she will take patient blood pressure tomorrow when office calls.  Daughter then asked what should patient do about missing her dose of her Losartan for tonight. Staff informed patient daughter again and message will be sent to provider for further advise.

## 2019-04-19 NOTE — Telephone Encounter (Signed)
Please let patient's daughter know about the medication backlog-daughter can ask the pharmacy if any of their other locations have the 50 mg because the pharmacy can transfer the RX to a different location that may have that dose.

## 2019-04-20 ENCOUNTER — Ambulatory Visit: Payer: Medicaid Other | Admitting: Family Medicine

## 2019-04-20 NOTE — Telephone Encounter (Signed)
noted 

## 2019-04-28 NOTE — Telephone Encounter (Signed)
Patient daughter stated she picked up medication from the pharmacy for her mother

## 2019-05-04 ENCOUNTER — Other Ambulatory Visit: Payer: Self-pay

## 2019-05-04 ENCOUNTER — Telehealth: Payer: Medicaid Other | Admitting: Neurology

## 2019-05-04 ENCOUNTER — Encounter: Payer: Self-pay | Admitting: Neurology

## 2019-05-06 ENCOUNTER — Ambulatory Visit: Payer: Medicaid Other | Admitting: Neurology

## 2019-05-17 NOTE — Progress Notes (Signed)
No-show on 05/04/2019

## 2019-06-30 ENCOUNTER — Ambulatory Visit: Payer: Medicaid Other | Admitting: Family Medicine

## 2019-07-01 ENCOUNTER — Ambulatory Visit: Payer: Medicaid Other | Attending: Family Medicine | Admitting: Internal Medicine

## 2019-07-01 ENCOUNTER — Other Ambulatory Visit: Payer: Self-pay

## 2019-07-01 ENCOUNTER — Encounter: Payer: Self-pay | Admitting: Internal Medicine

## 2019-07-01 VITALS — BP 162/78 | HR 60 | Temp 97.8°F | Resp 18 | Ht 61.0 in | Wt 120.0 lb

## 2019-07-01 DIAGNOSIS — I10 Essential (primary) hypertension: Secondary | ICD-10-CM | POA: Diagnosis present

## 2019-07-01 DIAGNOSIS — R634 Abnormal weight loss: Secondary | ICD-10-CM | POA: Diagnosis not present

## 2019-07-01 DIAGNOSIS — Z23 Encounter for immunization: Secondary | ICD-10-CM | POA: Diagnosis not present

## 2019-07-01 DIAGNOSIS — E785 Hyperlipidemia, unspecified: Secondary | ICD-10-CM | POA: Insufficient documentation

## 2019-07-01 DIAGNOSIS — Z79899 Other long term (current) drug therapy: Secondary | ICD-10-CM | POA: Insufficient documentation

## 2019-07-01 DIAGNOSIS — R358 Other polyuria: Secondary | ICD-10-CM | POA: Diagnosis not present

## 2019-07-01 DIAGNOSIS — R350 Frequency of micturition: Secondary | ICD-10-CM | POA: Diagnosis not present

## 2019-07-01 DIAGNOSIS — R3589 Other polyuria: Secondary | ICD-10-CM

## 2019-07-01 MED ORDER — METOPROLOL TARTRATE 25 MG PO TABS: 25.0000 mg | ORAL_TABLET | Freq: Two times a day (BID) | ORAL | 1 refills | Status: DC

## 2019-07-01 MED ORDER — ATORVASTATIN CALCIUM 10 MG PO TABS: 10.0000 mg | ORAL_TABLET | Freq: Every day | ORAL | 1 refills | Status: DC

## 2019-07-01 MED ORDER — LOSARTAN POTASSIUM 50 MG PO TABS: 50.0000 mg | ORAL_TABLET | Freq: Two times a day (BID) | ORAL | 1 refills | Status: DC

## 2019-07-01 NOTE — Addendum Note (Signed)
Addended by: Trecia Rogers on: 07/01/2019 12:23 PM   Modules accepted: Orders

## 2019-07-01 NOTE — Progress Notes (Signed)
Patient ID: Lynn [name], female    DOB: Mar 17, 1941  MRN: [id number]  CC: Annual Exam   Subjective: Lynn [name] is a 78 y.o. female who presents for a f/u visit.  PCP is Dr. Chapman Fitch who is not here today Her concerns today include:  Patient with history of HTN, anxiety, moderate cognitive impairment, HL  Daughter, Elvin So, is with her and interprets.  Pt speaks [place]  Daughter concern that pt loss 7lbs over past 6 mths. No change in appetite.  Eating 3 meals a day and fruits in b/w. Walks for 10-15 mins daily.  Sometimes 30 mins No N/V.  Moving bowels okay.  No blood in stools.  No problems swallowing.  Some epigastric pain intermittently in past 2 days. No fever or cough. No night sweats.   Reports frequent urination at nights for yrs.  Wakes up about 4 x/night to urinate. No dysuria. No hematuria. Drinks a cup of tea at nights No new medications Fasting.  Wants blood panel/test done BP elev.  Did not take meds as yet today.  Requests refill on blood pressure medication and atorvastatin.  She would like it to be a 65-month supply Patient Active Problem List   Diagnosis Date Noted  . 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 Bradycardia 03/01/2017  . 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 Hyperlipidemia 03/01/2017  . Edema 03/01/2017     Current Outpatient Medications on File Prior to Visit  Medication Sig Dispense Refill  . diclofenac sodium (VOLTAREN) 1 % GEL Apply 4 g topically 4 (four) times daily as needed. 500 g 6  . hydrOXYzine (ATARAX/VISTARIL) 10 MG tablet Take 1 tablet (10 mg total) by mouth 3 (three) times daily as needed for anxiety. 30 tablet 1  . ibuprofen (ADVIL,MOTRIN) 200 MG tablet Take 200 mg by mouth every 6 (six) hours as needed.    . Pyridoxine HCl (VITAMIN B-6 PO) Take by mouth.    . Thiamine HCl (VITAMIN B-1 PO) Take by mouth.     No current facility-administered medications on file prior to visit.     No Known Allergies  Social  History   Socioeconomic History  . Marital status: Widowed    Spouse name: Not on file  . Number of children: 2  . Years of education: some college  . Highest education level: Not on file  Occupational History  . Occupation: Retired  Scientific laboratory technician  . Financial resource strain: Not on file  . Food insecurity    Worry: Not on file    Inability: Not on file  . Transportation needs    Medical: Not on file    Non-medical: Not on file  Tobacco Use  . Smoking status: Never Smoker  . Smokeless tobacco: Never Used  Substance and Sexual Activity  . Alcohol use: No  . Drug use: No  . Sexual activity: Not Currently  Lifestyle  . Physical activity    Days per week: Not on file    Minutes per session: Not on file  . Stress: Not on file  Relationships  . Social Herbalist on phone: Not on file    Gets together: Not on file    Attends religious service: Not on file    Active member of club or organization: Not on file    Attends meetings of clubs or organizations: Not on file    Relationship status: Not on file  . Intimate partner violence    Fear of  current or ex partner: Not on file    Emotionally abused: Not on file    Physically abused: Not on file    Forced sexual activity: Not on file  Other Topics Concern  . 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 Diverticulitis Mother   . Other Father        unsure  . Hypertension Sister     History reviewed. No pertinent surgical history.  ROS: Review of Systems Negative except as stated above  PHYSICAL EXAM: BP (!) 162/78 (BP Location: Left Arm, Patient Position: Sitting, Cuff Size: Normal)   Pulse 60   Temp 97.8 F (36.6 C)  (Oral)   Resp 18   Ht 5\' 1"  (1.549 m)   Wt 120 lb (54.4 kg)   SpO2 98%   BMI 22.67 kg/m   Wt Readings from Last 3 Encounters:  07/01/19 120 lb (54.4 kg)  05/04/19 124 lb (56.2 kg)  10/27/18 128 lb (58.1 kg)    Physical Exam  General appearance - alert, well appearing, pleasant elderly female and in no distress Mental status - normal mood, behavior, speech, dress, motor activity, and thought processes Eyes - pupils equal and reactive, extraocular eye movements intact Nose - normal and patent, no erythema, discharge or polyps Mouth - mucous membranes moist, pharynx normal without lesions Neck - supple, no significant adenopathy, no thyroid enlargement or thyroid nodules Lymphadenopathy -no cervical, axillary or inguinal lymphadenopathy Chest - clear to auscultation, no wheezes, rales or rhonchi, symmetric air entry Heart - normal rate, regular rhythm, normal S1, S2, no murmurs, rubs, clicks or gallops Abdomen - soft, nontender, nondistended, no masses or organomegaly Extremities - peripheral pulses normal, no pedal edema, no clubbing or cyanosis   CMP Latest Ref Rng & Units 10/13/2018 06/01/2018 01/29/2018  Glucose 65 - 99 mg/dL 91 97 90  BUN 8 - 27 mg/dL 17 17 12   Creatinine 0.57 - 1.00 mg/dL 4.780.87 2.950.83 6.210.80  Sodium 134 - 144 mmol/L 135 136 131(L)  Potassium 3.5 - 5.2 mmol/L 3.9 4.1 3.6  Chloride 96 - 106 mmol/L 94(L) 93(L) 90(L)  CO2 20 - 29 mmol/L 27 25 27   Calcium 8.7 - 10.3 mg/dL 9.2 9.5 9.7  Total Protein 6.0 - 8.5 g/dL 6.5 - 6.9  Total Bilirubin 0.0 - 1.2 mg/dL 0.3 - 0.4  Alkaline Phos 39 - 117 IU/L 72 - 78  AST 0 - 40 IU/L 26 - 26  ALT 0 - 32 IU/L 20 - 20   Lipid Panel     Component Value Date/Time   CHOL 147 05/12/2018 1119   TRIG 81 05/12/2018 1119   HDL 57 05/12/2018 1119   CHOLHDL 2.6 05/12/2018 1119   CHOLHDL 2.3 02/25/2017 1003   VLDL 15 02/25/2017 1003   LDLCALC 74 05/12/2018 1119    CBC    Component Value Date/Time   WBC 8.2 10/13/2018 1708   WBC  5.4 02/25/2017 1003   RBC 4.09 10/13/2018 1708   RBC 4.40 02/25/2017 1003   HGB 12.1 10/13/2018 1708   HCT 35.1 10/13/2018 1708   PLT 306 10/13/2018 1708   MCV 86 10/13/2018 1708  MCH 29.6 10/13/2018 1708   MCH 29.8 02/25/2017 1003   MCHC 34.5 10/13/2018 1708   MCHC 33.2 02/25/2017 1003   RDW 12.1 10/13/2018 1708   LYMPHSABS 2.8 10/13/2018 1708   MONOABS 270 02/25/2017 1003   EOSABS 0.2 10/13/2018 1708   BASOSABS 0.1 10/13/2018 1708    ASSESSMENT AND PLAN:  1. Weight loss, non-intentional Of questionable etiology.  Exam today is unrevealing.  History does not point to any particular factor.  Will check baseline blood test - CBC - Comprehensive metabolic panel - TSH - Hemoglobin A1c  2. Essential hypertension Not at goal.  But patient has not taken medicines as yet for today - metoprolol tartrate (LOPRESSOR) 25 MG tablet; Take 1 tablet (25 mg total) by mouth 2 (two) times daily. To lower blood pressure  Dispense: 180 tablet; Refill: 1 - losartan (COZAAR) 50 MG tablet; Take 1 tablet (50 mg total) by mouth 2 (two) times daily. To lower blood pressure  Dispense: 180 tablet; Refill: 1  3. Hyperlipidemia, unspecified hyperlipidemia type - Lipid panel - atorvastatin (LIPITOR) 10 MG tablet; Take 1 tablet (10 mg total) by mouth daily at 6 PM.  Dispense: 90 tablet; Refill: 1  4. Polyuria Advised to avoid drinking caffeinated tea at bedtime as coffee is an irritant to the bladder. - Hemoglobin A1c  5. Need for immunization against influenza Given by [facility]   Patient was given the opportunity to ask questions.  Patient verbalized understanding of the plan and was able to repeat key elements of the plan.   Orders Placed This Encounter  Procedures  . CBC  . Lipid panel  . Comprehensive metabolic panel  . TSH  . Hemoglobin A1c     Requested Prescriptions   Signed Prescriptions Disp Refills  . atorvastatin (LIPITOR) 10 MG tablet 90 tablet 1    Sig: Take 1 tablet (10  mg total) by mouth daily at 6 PM.  . metoprolol tartrate (LOPRESSOR) 25 MG tablet 180 tablet 1    Sig: Take 1 tablet (25 mg total) by mouth 2 (two) times daily. To lower blood pressure  . losartan (COZAAR) 50 MG tablet 180 tablet 1    Sig: Take 1 tablet (50 mg total) by mouth 2 (two) times daily. To lower blood pressure    Return in about 2 months (around 08/31/2019) for [place].  Jonah Blue, MD, FACP

## 2019-07-02 LAB — CBC
Hematocrit: 38.1 % (ref 34.0–46.6)
Hemoglobin: 12.8 g/dL (ref 11.1–15.9)
MCH: 29.8 pg (ref 26.6–33.0)
MCHC: 33.6 g/dL (ref 31.5–35.7)
MCV: 89 fL (ref 79–97)
Platelets: 271 10*3/uL (ref 150–450)
RBC: 4.3 x10E6/uL (ref 3.77–5.28)
RDW: 12 % (ref 11.7–15.4)
WBC: 6.4 10*3/uL (ref 3.4–10.8)

## 2019-07-02 LAB — COMPREHENSIVE METABOLIC PANEL
ALT: 18 IU/L (ref 0–32)
AST: 25 IU/L (ref 0–40)
Albumin/Globulin Ratio: 1.6 (ref 1.2–2.2)
Albumin: 4.1 g/dL (ref 3.7–4.7)
Alkaline Phosphatase: 78 IU/L (ref 39–117)
BUN/Creatinine Ratio: 24 (ref 12–28)
BUN: 18 mg/dL (ref 8–27)
Bilirubin Total: 0.5 mg/dL (ref 0.0–1.2)
CO2: 27 mmol/L (ref 20–29)
Calcium: 9.4 mg/dL (ref 8.7–10.3)
Chloride: 95 mmol/L — ABNORMAL LOW (ref 96–106)
Creatinine, Ser: 0.75 mg/dL (ref 0.57–1.00)
GFR calc Af Amer: 88 mL/min/{1.73_m2} (ref >59)
GFR calc non Af Amer: 77 mL/min/{1.73_m2} (ref >59)
Globulin, Total: 2.6 g/dL (ref 1.5–4.5)
Glucose: 92 mg/dL (ref 65–99)
Potassium: 4.8 mmol/L (ref 3.5–5.2)
Sodium: 136 mmol/L (ref 134–144)
Total Protein: 6.7 g/dL (ref 6.0–8.5)

## 2019-07-02 LAB — LIPID PANEL
Chol/HDL Ratio: 2.7 ratio (ref 0.0–4.4)
Cholesterol, Total: 149 mg/dL (ref 100–199)
HDL: 56 mg/dL (ref >39)
LDL Chol Calc (NIH): 78 mg/dL (ref 0–99)
Triglycerides: 79 mg/dL (ref 0–149)
VLDL Cholesterol Cal: 15 mg/dL (ref 5–40)

## 2019-07-02 LAB — HEMOGLOBIN A1C
Est. average glucose Bld gHb Est-mCnc: 123 mg/dL
Hgb A1c MFr Bld: 5.9 % — ABNORMAL HIGH (ref 4.8–5.6)

## 2019-07-02 LAB — TSH: TSH: 2.66 u[IU]/mL (ref 0.450–4.500)

## 2019-07-04 ENCOUNTER — Telehealth: Payer: Self-pay | Admitting: *Deleted

## 2019-07-04 NOTE — Telephone Encounter (Signed)
-----   Message from Ladell Pier, MD sent at 07/02/2019  2:00 PM EDT ----- Let pt know that her blood count is nl meaning no anemia.  Cholesterol level is good.  Her kidney and LFTs are nl.  Thyroid level nl.  She has preDM. Healthy eating habits and exercise as tolerated will help prevent progression to DM.

## 2019-07-04 NOTE — Telephone Encounter (Signed)
Patient verified DOB Patients daughter is aware of levels being normal except for slight preDM at 5.9 and needing to implement healthy eating and exercise as tolerated. Labs printed and placed with screener.

## 2019-07-20 ENCOUNTER — Encounter: Payer: Self-pay | Admitting: Cardiology

## 2019-07-20 ENCOUNTER — Other Ambulatory Visit: Payer: Self-pay

## 2019-07-20 ENCOUNTER — Ambulatory Visit (INDEPENDENT_AMBULATORY_CARE_PROVIDER_SITE_OTHER): Payer: Medicare Other | Admitting: Cardiology

## 2019-07-20 VITALS — BP 148/70 | HR 54 | Ht 61.0 in | Wt 122.0 lb

## 2019-07-20 DIAGNOSIS — F419 Anxiety disorder, unspecified: Secondary | ICD-10-CM | POA: Diagnosis not present

## 2019-07-20 DIAGNOSIS — R001 Bradycardia, unspecified: Secondary | ICD-10-CM | POA: Diagnosis not present

## 2019-07-20 DIAGNOSIS — I10 Essential (primary) hypertension: Secondary | ICD-10-CM | POA: Diagnosis not present

## 2019-07-20 MED ORDER — HYDROCHLOROTHIAZIDE 12.5 MG PO CAPS: 12.5000 mg | ORAL_CAPSULE | Freq: Every day | ORAL | 11 refills | Status: DC

## 2019-07-20 NOTE — Progress Notes (Signed)
Cardiology Office Note:    Date:  07/20/2019   ID:  Mystic, Labo 1941-06-07, MRN [id number]  PCP:  Cain Saupe, MD  Cardiologist:  Donato Schultz, MD  Electrophysiologist:  None   Referring MD: Anders Simmonds, PA-C     History of Present Illness:    Lynn [name] is a 78 y.o. female here for the evaluation of hypertension at the request of [place], [place].  In review of prior clinic note, has hypertension anxiety and moderate cognitive impairment and continues to have issues with blood pressures being elevated usually at night.  She remembers taking captopril in the past.  She has been compliant with metoprolol and losartan.  If her blood pressure does get elevated, she does get symptoms of headache.  She would be in the emergency room 5 times a year and around with headache and elevated blood pressure.  She is anxious and nervous about this.  Her sister recently passed away.   Occasionally during these higher blood pressure episodes when she is feeling anxious with a headache, she will hear some rushing in her ears.   No MI, no tob, no alcohol  On losartan 50 2 times a day, metoprolol 25 2 times a day.  Past Medical History:  Diagnosis Date  . Bradycardia 03/01/2017  . Edema 03/01/2017  . 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 Hyperlipidemia 03/01/2017  . Hypertension   . Memory loss   . 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  Current Medications: Current Meds  Medication Sig  . atorvastatin (LIPITOR) 10 MG tablet Take 1 tablet (10 mg total) by mouth daily at 6 PM.  . diclofenac sodium (VOLTAREN) 1 % GEL Apply 4 g topically 4 (four) times daily as needed.  . hydrOXYzine (ATARAX/VISTARIL) 10 MG tablet Take 1 tablet (10 mg total) by mouth 3 (three) times daily as needed for anxiety.  [name] ibuprofen (ADVIL,MOTRIN) 200 MG tablet Take 200 mg by mouth every 6 (six) hours as needed.  [name] losartan (COZAAR) 50 MG tablet Take 1  tablet (50 mg total) by mouth 2 (two) times daily. To lower blood pressure  . metoprolol tartrate (LOPRESSOR) 25 MG tablet Take 1 tablet (25 mg total) by mouth 2 (two) times daily. To lower blood pressure  . Pyridoxine HCl (VITAMIN B-6 PO) Take by mouth.  . Thiamine HCl (VITAMIN B-1 PO) Take by mouth.     Allergies:   Patient has no known allergies.   Social History   Socioeconomic History  . Marital status: Widowed    Spouse name: Not on file  . Number of children: 2  . Years of education: some college  . Highest education level: Not on file  Occupational History  . Occupation: Retired  Engineer, production  . Financial resource strain: Not on file  . Food insecurity    Worry: Not on file    Inability: Not on file  . Transportation needs    Medical: Not on file    Non-medical: Not on file  Tobacco Use  . Smoking status: Never Smoker  . Smokeless tobacco: Never Used  Substance and Sexual Activity  . Alcohol use: No  . Drug use: No  . Sexual activity: Not Currently  Lifestyle  . Physical activity    Days per week: Not on file    Minutes per session: Not on file  . Stress: Not on file  Relationships  . Social connections  Talks on phone: Not on file    Gets together: Not on file    Attends religious service: Not on file    Active member of club or organization: Not on file    Attends meetings of clubs or organizations: Not on file    Relationship status: Not on file  Other Topics Concern  . 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  ROS:   Please see the history of present illness.    No fevers  chills nausea vomiting all other systems reviewed and are negative.  EKGs/Labs/Other Studies Reviewed:    The following studies were reviewed today: Prior EKG  EKG:  EKG is  ordered today.  The ekg ordered today demonstrates 07/20/19 sinus bradycardia 54 with no other abnormalities  Recent Labs: 07/01/2019: ALT 18; BUN 18; Creatinine, Ser 0.75; Hemoglobin 12.8; Platelets 271; Potassium 4.8; Sodium 136; TSH 2.660  Recent Lipid Panel    Component Value Date/Time   CHOL 149 07/01/2019 1151   TRIG 79 07/01/2019 1151   HDL 56 07/01/2019 1151   CHOLHDL 2.7 07/01/2019 1151   CHOLHDL 2.3 02/25/2017 1003   VLDL 15 02/25/2017 1003   LDLCALC 78 07/01/2019 1151    Physical Exam:    VS:  BP (!) 148/70   Pulse (!) 54   Ht 5\' 1"  (1.549 m)   Wt 122 lb (55.3 kg)   SpO2 97%   BMI 23.05 kg/m     Wt Readings from Last 3 Encounters:  07/20/19 122 lb (55.3 kg)  07/01/19 120 lb (54.4 kg)  05/04/19 124 lb (56.2 kg)     GEN:  Well nourished, well developed in no acute distress HEENT: Normal NECK: No JVD; No carotid bruits LYMPHATICS: No lymphadenopathy CARDIAC: RRR, no murmurs, rubs, gallops RESPIRATORY:  Clear to auscultation without rales, wheezing or rhonchi  ABDOMEN: Soft, non-tender, non-distended MUSCULOSKELETAL:  No edema; No deformity  SKIN: Warm and dry NEUROLOGIC:  Alert and oriented x 3 PSYCHIATRIC:  Normal affect   ASSESSMENT:    1. Essential hypertension   2. Bradycardia   3. Anxiety    PLAN:    In order of problems listed above:  Essential hypertension -Has been difficult to control in the past.  Occasionally she will take "extra "pill occasionally for high blood pressure. -I have added HCTZ 12.5 mg once a day to her regimen. I think the addition of low-dose diuretic will help smooth things out for her.  Bradycardia -No room to go up on her metoprolol 54 bpm.  Metoprolol may be helpful for her anxiety as well.  Usually, a third or fourth line agent for  hypertension however.  Anxiety -Certainly playing a role at times with her elevated blood pressures.  Vistaril which she is prescribed can help.  In 2 weeks, I will check a basic metabolic profile and have her see our hypertension clinic for further follow-up.   Medication Adjustments/Labs and Tests Ordered: Current medicines are reviewed at length with the patient today.  Concerns regarding medicines are outlined above.  Orders Placed This Encounter  Procedures  .  Basic metabolic panel  . EKG 12-Lead   Meds ordered this encounter  Medications  . hydrochlorothiazide (MICROZIDE) 12.5 MG capsule    Sig: Take 1 capsule (12.5 mg total) by mouth daily.    Dispense:  30 capsule    Refill:  11    Patient Instructions  Medication Instructions:  Please start Hydrochlorothiazide 12.5 mg once a day.  Continue all other mediations as listed.  *If you need a refill on your cardiac medications before your next appointment, please call your pharmacy*  Lab Work: Please have blood work in 2 weeks.  (BMP)  If you have labs (blood work) drawn today and your tests are completely normal, you will receive your results only by: [name] MyChart Message (if you have MyChart) OR . A paper copy in the mail If you have any lab test that is abnormal or we need to change your treatment, we will call you to review the results.  Follow-Up: Follow up in the Hypertension Clinic in 2 weeks.  Thank you for choosing [facility]!!          Signed, Candee Furbish, MD  07/20/2019 2:57 PM    [facility]

## 2019-07-20 NOTE — Patient Instructions (Signed)
Medication Instructions:  Please start Hydrochlorothiazide 12.5 mg once a day.  Continue all other mediations as listed.  *If you need a refill on your cardiac medications before your next appointment, please call your pharmacy*  Lab Work: Please have blood work in 2 weeks.  (BMP)  If you have labs (blood work) drawn today and your tests are completely normal, you will receive your results only by: [name] MyChart Message (if you have MyChart) OR . A paper copy in the mail If you have any lab test that is abnormal or we need to change your treatment, we will call you to review the results.  Follow-Up: Follow up in the Hypertension Clinic in 2 weeks.  Thank you for choosing [facility]!!

## 2019-08-03 ENCOUNTER — Other Ambulatory Visit: Payer: Medicaid Other | Admitting: *Deleted

## 2019-08-03 ENCOUNTER — Ambulatory Visit (INDEPENDENT_AMBULATORY_CARE_PROVIDER_SITE_OTHER): Payer: Medicaid Other

## 2019-08-03 ENCOUNTER — Other Ambulatory Visit: Payer: Self-pay

## 2019-08-03 ENCOUNTER — Other Ambulatory Visit: Payer: Medicaid Other

## 2019-08-03 DIAGNOSIS — I10 Essential (primary) hypertension: Secondary | ICD-10-CM

## 2019-08-03 DIAGNOSIS — R001 Bradycardia, unspecified: Secondary | ICD-10-CM

## 2019-08-03 MED ORDER — HYDROCHLOROTHIAZIDE 25 MG PO TABS: 25.0000 mg | ORAL_TABLET | Freq: Every day | ORAL | 3 refills | Status: DC

## 2019-08-03 MED ORDER — METOPROLOL TARTRATE 25 MG PO TABS: 12.5000 mg | ORAL_TABLET | Freq: Two times a day (BID) | ORAL | 1 refills | Status: DC

## 2019-08-03 NOTE — Patient Instructions (Addendum)
It was nice to see you today!  Please start taking metoprolol 1/2 tablet (12.5 mg) by mouth twice daily.  Please start taking hydrochlorothiazide 1 tablet (25 mg) by mouth once daily. You can take this in the morning.   Blood work today - we will call you with the results.

## 2019-08-03 NOTE — Progress Notes (Signed)
Patient ID: Lynn [name]                 DOB: April 05, 1941                      MRN: [id number]     HPI: Lynn [name] is a 78 y.o. female referred by Dr. Marlou Porch to HTN clinic. PMH is significant for HTN, HLD, mild cognitive disorder/memory loss, and bradycardia. At her last appointment with Dr. Marlou Porch on 07/20/19, her BP was 148/70 and hydrochlorothiazide 12.5 mg daily was added. In the past, she had taken hydrochlorothiazide but the dose had been reduced from 50 mg daily to 25 mg daily in Aug/Sept 2019 due to urinary frequency.  Patient presents to HTN clinic for initial visit with her daughter, who also served as the translator for the visit. She reported that she had a headache after taking two doses of the hydrochlorothiazide 12.5 mg dose but thought this may have been related to her high BP. She has not taken the hydrochlorothiazide after these two doses. She says she was on hydrochlorothiazide 25 mg daily in the past and said it had worked well for her.   Her daughter reported that she had two episodes over the last few weeks where her mother looked unwell, with cold extremities, low heart rate down to 54, and BP up to 170s. She says that after about 2 hours and with drinking some water, she returned to baseline. She said that these events usually only occur once a month or so, but was concerned with it happening more frequently. She states her mother's HR usually runs about 55-60.   Current HTN meds: losartan 50 mg BID, metoprolol 25 mg BID, hydrochlorothiazide 12.5 mg daily  Previously tried: captopril (per pt), amlodipine (swelling), valsartan (switched to losartan per MD)  BP goal: <130/80  Family History: Diverticulitis in her mother; Hypertension in her sister; Other in her father.  Social History: never smoker, no alcohol intake  Diet: very healthy diet - fresh vegetables, fruit, chicken, fish  Exercise: moderate, walking mostly  Home BP readings: highest  have been systolic 086V per daughter  Wt Readings from Last 3 Encounters:  07/20/19 122 lb (55.3 kg)  07/01/19 120 lb (54.4 kg)  05/04/19 124 lb (56.2 kg)   BP Readings from Last 3 Encounters:  07/20/19 (!) 148/70  07/01/19 (!) 162/78  10/27/18 118/60   Pulse Readings from Last 3 Encounters:  07/20/19 (!) 54  07/01/19 60  10/27/18 67    Renal function: CrCl cannot be calculated (Patient's most recent lab result is older than the maximum 21 days allowed.).  Past Medical History:  Diagnosis Date  . Bradycardia 03/01/2017  . Edema 03/01/2017  . 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 Hyperlipidemia 03/01/2017  . Hypertension   . Memory loss   . Mild cognitive disorder 06/14/2018    Current Outpatient Medications on File Prior to Visit  Medication Sig Dispense Refill  . atorvastatin (LIPITOR) 10 MG tablet Take 1 tablet (10 mg total) by mouth daily at 6 PM. 90 tablet 1  . diclofenac sodium (VOLTAREN) 1 % GEL Apply 4 g topically 4 (four) times daily as needed. 500 g 6  . hydrochlorothiazide (MICROZIDE) 12.5 MG capsule Take 1 capsule (12.5 mg total) by mouth daily. 30 capsule 11  . hydrOXYzine (ATARAX/VISTARIL) 10 MG tablet Take 1 tablet (10 mg total) by mouth 3 (three) times daily as needed for anxiety. 30 tablet 1  .  ibuprofen (ADVIL,MOTRIN) 200 MG tablet Take 200 mg by mouth every 6 (six) hours as needed.    [name] losartan (COZAAR) 50 MG tablet Take 1 tablet (50 mg total) by mouth 2 (two) times daily. To lower blood pressure 180 tablet 1  . metoprolol tartrate (LOPRESSOR) 25 MG tablet Take 1 tablet (25 mg total) by mouth 2 (two) times daily. To lower blood pressure 180 tablet 1  . Pyridoxine HCl (VITAMIN B-6 PO) Take by mouth.    . Thiamine HCl (VITAMIN B-1 PO) Take by mouth.     No current facility-administered medications on file prior to visit.     No Known Allergies   Assessment/Plan:  1. Hypertension - BP is above goal at 168/66 (goal < 130/80). Start taking  hydrochlorothiazide 25 mg once daily. Instructed to call if any worsening headache or tolerability issues. Decrease metoprolol to 12.5 mg BID given recurring issues with bradycardia. BMET today - will call with lab results. Follow up with HTN clinic in 2 weeks for BP check and repeat BMET.   Danae Orleans, PharmD PGY2 Cardiology Pharmacy Resident [facility] [address], [place], [place] [address] Phone: [phone]; Fax: [phone]

## 2019-08-04 LAB — BASIC METABOLIC PANEL
BUN/Creatinine Ratio: 18 (ref 12–28)
BUN: 17 mg/dL (ref 8–27)
CO2: 25 mmol/L (ref 20–29)
Calcium: 9.3 mg/dL (ref 8.7–10.3)
Chloride: 94 mmol/L — ABNORMAL LOW (ref 96–106)
Creatinine, Ser: 0.96 mg/dL (ref 0.57–1.00)
GFR calc Af Amer: 66 mL/min/{1.73_m2} (ref >59)
GFR calc non Af Amer: 57 mL/min/{1.73_m2} — ABNORMAL LOW (ref >59)
Glucose: 74 mg/dL (ref 65–99)
Potassium: 4 mmol/L (ref 3.5–5.2)
Sodium: 132 mmol/L — ABNORMAL LOW (ref 134–144)

## 2019-08-10 ENCOUNTER — Other Ambulatory Visit: Payer: Self-pay | Admitting: Neurology

## 2019-08-10 DIAGNOSIS — R413 Other amnesia: Secondary | ICD-10-CM

## 2019-08-17 ENCOUNTER — Ambulatory Visit: Payer: Medicaid Other

## 2019-08-17 ENCOUNTER — Other Ambulatory Visit: Payer: Medicaid Other

## 2019-08-31 ENCOUNTER — Ambulatory Visit: Payer: Medicaid Other | Admitting: Family Medicine

## 2019-09-19 ENCOUNTER — Other Ambulatory Visit: Payer: Medicaid Other

## 2019-09-19 ENCOUNTER — Ambulatory Visit: Payer: Medicaid Other

## 2019-09-28 ENCOUNTER — Other Ambulatory Visit: Payer: Medicaid Other

## 2019-09-28 ENCOUNTER — Ambulatory Visit: Payer: Medicaid Other

## 2019-09-29 ENCOUNTER — Ambulatory Visit: Payer: Medicaid Other | Admitting: Family Medicine

## 2019-11-18 ENCOUNTER — Other Ambulatory Visit: Payer: Self-pay

## 2019-11-18 ENCOUNTER — Encounter: Payer: Self-pay | Admitting: Family Medicine

## 2019-11-18 ENCOUNTER — Ambulatory Visit: Payer: Medicare Other | Attending: Family Medicine | Admitting: Family Medicine

## 2019-11-18 VITALS — BP 134/84 | HR 62 | Ht 61.0 in | Wt 122.0 lb

## 2019-11-18 DIAGNOSIS — Z8249 Family history of ischemic heart disease and other diseases of the circulatory system: Secondary | ICD-10-CM | POA: Diagnosis not present

## 2019-11-18 DIAGNOSIS — E871 Hypo-osmolality and hyponatremia: Secondary | ICD-10-CM | POA: Diagnosis not present

## 2019-11-18 DIAGNOSIS — Z0000 Encounter for general adult medical examination without abnormal findings: Secondary | ICD-10-CM

## 2019-11-18 DIAGNOSIS — R413 Other amnesia: Secondary | ICD-10-CM

## 2019-11-18 DIAGNOSIS — R7303 Prediabetes: Secondary | ICD-10-CM | POA: Diagnosis not present

## 2019-11-18 DIAGNOSIS — I10 Essential (primary) hypertension: Secondary | ICD-10-CM

## 2019-11-18 DIAGNOSIS — Z79899 Other long term (current) drug therapy: Secondary | ICD-10-CM

## 2019-11-18 DIAGNOSIS — E785 Hyperlipidemia, unspecified: Secondary | ICD-10-CM | POA: Diagnosis not present

## 2019-11-18 DIAGNOSIS — Z789 Other specified health status: Secondary | ICD-10-CM

## 2019-11-18 DIAGNOSIS — F0390 Unspecified dementia without behavioral disturbance: Secondary | ICD-10-CM

## 2019-11-18 DIAGNOSIS — F03B0 Unspecified dementia, moderate, without behavioral disturbance, psychotic disturbance, mood disturbance, and anxiety: Secondary | ICD-10-CM

## 2019-11-18 DIAGNOSIS — Z603 Acculturation difficulty: Secondary | ICD-10-CM

## 2019-11-18 DIAGNOSIS — R5383 Other fatigue: Secondary | ICD-10-CM | POA: Diagnosis not present

## 2019-11-18 DIAGNOSIS — Z758 Other problems related to medical facilities and other health care: Secondary | ICD-10-CM

## 2019-11-18 MED ORDER — AMLODIPINE BESYLATE 2.5 MG PO TABS: 2.5000 mg | ORAL_TABLET | Freq: Every day | ORAL | 1 refills | Status: DC

## 2019-11-18 NOTE — Progress Notes (Signed)
Subjective:  Patient ID: Lynn [name], female    DOB: June 30, 1941  Age: 79 y.o. MRN: [id number]  CC: Annual Exam  Due to language [name], patient was offered [employer] video interpretation services at today's visit which she and daughter declined.  Daughter wishes to service the interpreter for today's visit  HPI Lynn [name], 79 yo female with medical issues including hypertension, hyperlipidemia, cognitive impairment/memory loss, osteoporosis and bradycardia who was last seen in the office on 07/01/2019 by another provider, who was scheduled for her annual well exam states that she is also here about her blood pressure at today's visit as well.  Regarding preventative services, patient reports that she does not wish to have a Pap smear, mammogram or bone density scan.  She made the appointment for annual exam because she received a letter from her insurance company.  Per daughter, patient's blood pressure has been elevated recently with systolic number being in the 150s to 170s especially in the evenings and patient would have headaches and appear drained and complain of fatigue.  Daughter had some leftover amlodipine that patient had taken in the past and daughter started giving patient the amlodipine daily and patient's blood pressures have been less than 140/90 and patient has felt well. The amlodipine had been discontinued in the past per her daughter by cardiology after patient developed some swelling in her lower leg/ankle area.  Since restarting the medication, the swelling has been minimal.           Patient continues to take atorvastatin for hyperlipidemia and her daughter would like for patient to continue this medication.  Patient denies any muscle aches associated with the use of this medication.  Patient's memory issues/dementia has been stable with slow decline over time.  Patient is having no issues with sleep or behavior.  Per daughter, patient does need to be  observed because patient will attempt to cook and leave on the burner or will leave water running after washing her hands.  She is not currently on any medication for dementia treatment.  She takes vitamin D and calcium supplements due to osteoporosis.  She has had no falls in the past 12 months.  Daughter feels that patient has a good appetite and sleeps well.  She feels that her mother's health is stable at this time.  Past Medical History:  Diagnosis Date  . Bradycardia 03/01/2017  . Edema 03/01/2017  . 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 Hyperlipidemia 03/01/2017  . Hypertension   . Memory loss   . 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 No pertinent surgical history.  Family History  Problem Relation Age of Onset  . Diverticulitis Mother   . Other Father        unsure  . Hypertension Sister     Social History   Tobacco Use  . Smoking status: Never Smoker  . Smokeless tobacco: Never Used  Substance Use Topics  . Alcohol use: No    ROS Review of Systems  Constitutional: Positive for fatigue (mild at this time). Negative for activity change, appetite change, chills and fever.  HENT: Negative for sore throat and trouble swallowing.   Eyes: Negative for photophobia and visual disturbance.  Respiratory: Negative for cough and shortness of breath.   Cardiovascular: Negative for chest pain, palpitations and leg swelling.  Gastrointestinal: Negative for abdominal pain, blood in stool, constipation, diarrhea and nausea.  Endocrine: Negative for polydipsia, polyphagia and polyuria.  Genitourinary: Negative for dysuria and  frequency.  Musculoskeletal: Negative for arthralgias and back pain.  Skin: Negative for rash and wound.  Neurological: Negative for dizziness and headaches.  Hematological: Negative for adenopathy. Does not bruise/bleed easily.  Psychiatric/Behavioral: Positive for confusion and decreased concentration. Negative for agitation and behavioral  problems.    Objective:   Today's Vitals: BP 134/84   Pulse 62   Ht 5\' 1"  (1.549 m)   Wt 122 lb (55.3 kg)   SpO2 98%   BMI 23.05 kg/m   Physical Exam Vitals and nursing note reviewed.  Constitutional:      Appearance: Normal appearance. She is normal weight.     Comments: Well-nourished well-developed, short statured/small framed elderly female who appears younger than stated age.  She is in no acute distress, wearing a mask as per office COVID-19 precautions and is accompanied by her adult daughter at today's visit.  Eyes:     Extraocular Movements: Extraocular movements intact.     Conjunctiva/sclera: Conjunctivae normal.  Neck:     Vascular: No carotid bruit.  Cardiovascular:     Rate and Rhythm: Normal rate and regular rhythm.  Pulmonary:     Effort: Pulmonary effort is normal.     Breath sounds: Normal breath sounds.  Abdominal:     Palpations: Abdomen is soft.     Tenderness: There is no abdominal tenderness. There is no right CVA tenderness, left CVA tenderness, guarding or rebound.  Musculoskeletal:        General: No swelling or tenderness.     Cervical back: Normal range of motion and neck supple. No tenderness.     Right lower leg: No edema.     Left lower leg: No edema.     Comments: Mild puffiness at the distal lower extremity/ankles but no significant edema  Lymphadenopathy:     Cervical: No cervical adenopathy.  Skin:    General: Skin is warm and dry.  Neurological:     General: No focal deficit present.     Mental Status: She is alert and oriented to person, place, and time.  Psychiatric:        Mood and Affect: Mood normal.        Behavior: Behavior normal.     Assessment & Plan:  1. Essential hypertension On review of chart, patient was seen at cardiology hypertension clinic on 08/03/2019 and at that time had been taking losartan 50 mg twice daily, metoprolol 25 mg twice daily and hydrochlorothiazide 12.5 mg which patient had stopped after taking  only 2 doses.  Blood pressure at that visit was elevated at 168/66 and medicines were changed to hydrochlorothiazide 25 mg daily metoprolol decreased to 12.5 mg twice daily due to patient's issues with bradycardia and continuation of losartan.  Patient did not return for scheduled follow-up. Daughter reports recent home blood pressures have again been elevated but that daughter restarted use of amlodipine previously prescribed and patient has tolerated without issues of increased peripheral edema.  New prescription provided for amlodipine 2.5 mg.  Continue home monitoring with blood pressure goal of 140/90 or less.  Return for fasting lab visit for repeat lipid panel as well as electrolyte recheck as part of comprehensive metabolic panel. - amLODipine (NORVASC) 2.5 MG tablet; Take 1 tablet (2.5 mg total) by mouth daily.  Dispense: 90 tablet; Refill: 1 - Lipid panel; Future  2. Hyperlipidemia, unspecified hyperlipidemia type Daughter is currently on atorvastatin 10 mg which daughter would like patient to continue at this time.  Daughter would also  like for patient to have repeat lipid panel.  We will have patient return for fasting lab visit lipid panel and comprehensive metabolic panel.  Patient denies any current muscle aches related to use of atorvastatin.  - Lipid panel; Future - Comprehensive metabolic panel; Future  3. Hyponatremia We will recheck sodium level as part of comprehensive metabolic panel which patient will have at an upcoming lab visit as hyponatremia may contribute to confusion and can increase fall risk. - Comprehensive metabolic panel; Future  4. Memory deficit; Moderate dementia without behavioral disturbance Patient is followed by neurology, and on review of chart, her last visit was 10/27/2018 with Dr. Karel Jarvis.  Patient with Mini-Mental Status exam of 14/30 which was a decline from MMSE of 28/30 done at another neurologic practice in September 2019.  Aricept was discussed at that  time per neurology note but patient is not taking this medication currently.  She does not have any behavioral issues and per daughter currently has a good appetite and sleeps well.  Patient does have to have close observation per daughter as she will occasionally leave the water running after washing her hands or attempt to cook.  5. Encounter for long-term (current) use of medications Patient will have comprehensive metabolic panel at upcoming lab visit and follow-up of use of statin medication for hyperlipidemia as well as for use of blood pressure medications as patient has also had past hyponatremia. - Comprehensive metabolic panel; Future  6. Fatigue, unspecified type; 7.  Prediabetes Patient will return for fasting blood work and at that time will also have CBC and TSH as well as comprehensive metabolic panel in follow-up of her complaints of fatigue.  Additionally, review of chart, patient with hemoglobin A1c on 07/01/2019 of 5.9 consistent with prediabetes and hemoglobin A1c will be done at upcoming lab visit.  She has had normal vitamin D level in February 2020 and continues to take vitamin D and calcium supplement due to osteoporosis. - CBC; Future - TSH; Future - Comprehensive metabolic panel; Future  8. Language [name] Patient and daughter declined video interpretation service at today's visit.  Daughter is fluent in [place] and translated for the patient at today's visit.  -Encounter for annual wellness exam Preventative health measures were discussed with the patient and daughter at today's visit.  Patient and daughter declined referral for mammogram, Pap smear or DEXA/bone density scans now or in the future however they were made aware that if they change their mind that they can call regarding scheduling.  Patient for her age does not have any significant physical debility's, no need for assistive devices and is able to self perform ADLs with minimal direction.  She has had no recent  falls-no falls in the past 12 months.  Daughter reports that patient has had a good appetite, sleeps well and is not having any behavioral issues related to her dementia.  Discussed with daughter and patient that patient with DEXA scan done this year which shows osteoporosis and patient should continue weightbearing exercise such as walking along with vitamin D and calcium daily supplementation.  Patient is not interested in other medications to help prevent bone loss.  Outpatient Encounter Medications as of 11/18/2019  Medication Sig  . amLODipine (NORVASC) 2.5 MG tablet Take 1 tablet (2.5 mg total) by mouth daily.  [name] atorvastatin (LIPITOR) 10 MG tablet Take 1 tablet (10 mg total) by mouth daily at 6 PM.  . diclofenac sodium (VOLTAREN) 1 % GEL Apply 4 g topically 4 (four)  times daily as needed.  . hydrOXYzine (ATARAX/VISTARIL) 10 MG tablet Take 1 tablet (10 mg total) by mouth 3 (three) times daily as needed for anxiety.  [name] ibuprofen (ADVIL,MOTRIN) 200 MG tablet Take 200 mg by mouth every 6 (six) hours as needed.  . metoprolol tartrate (LOPRESSOR) 25 MG tablet Take 0.5 tablets (12.5 mg total) by mouth 2 (two) times daily. To lower blood pressure  . Pyridoxine HCl (VITAMIN B-6 PO) Take by mouth.  . Thiamine HCl (VITAMIN B-1 PO) Take by mouth.  . [DISCONTINUED] amLODipine (NORVASC) 2.5 MG tablet Take 1.25 mg by mouth daily.  . hydrochlorothiazide (HYDRODIURIL) 25 MG tablet Take 1 tablet (25 mg total) by mouth daily.  [name] losartan (COZAAR) 50 MG tablet Take 1 tablet (50 mg total) by mouth 2 (two) times daily. To lower blood pressure (Patient not taking: Reported on 11/18/2019)   No facility-administered encounter medications on file as of 11/18/2019.    An After Visit Summary was printed and given to the patient.   Follow-up: Return in about 6 months (around 05/17/2020) for HTN/chronic issues; 1-2 week fasting lab visit.   Cain Saupe MD

## 2019-11-19 ENCOUNTER — Encounter: Payer: Self-pay | Admitting: Family Medicine

## 2019-11-19 DIAGNOSIS — E871 Hypo-osmolality and hyponatremia: Secondary | ICD-10-CM | POA: Insufficient documentation

## 2019-11-19 DIAGNOSIS — R7303 Prediabetes: Secondary | ICD-10-CM | POA: Insufficient documentation

## 2019-11-19 DIAGNOSIS — F0390 Unspecified dementia without behavioral disturbance: Secondary | ICD-10-CM | POA: Insufficient documentation

## 2019-11-19 DIAGNOSIS — F03B0 Unspecified dementia, moderate, without behavioral disturbance, psychotic disturbance, mood disturbance, and anxiety: Secondary | ICD-10-CM | POA: Insufficient documentation

## 2019-12-02 ENCOUNTER — Other Ambulatory Visit: Payer: Self-pay

## 2019-12-02 ENCOUNTER — Ambulatory Visit: Payer: Medicare Other | Attending: Family Medicine

## 2019-12-02 DIAGNOSIS — Z79899 Other long term (current) drug therapy: Secondary | ICD-10-CM

## 2019-12-02 DIAGNOSIS — R5383 Other fatigue: Secondary | ICD-10-CM

## 2019-12-02 DIAGNOSIS — E785 Hyperlipidemia, unspecified: Secondary | ICD-10-CM

## 2019-12-02 DIAGNOSIS — E871 Hypo-osmolality and hyponatremia: Secondary | ICD-10-CM

## 2019-12-02 DIAGNOSIS — R7303 Prediabetes: Secondary | ICD-10-CM

## 2019-12-02 DIAGNOSIS — I10 Essential (primary) hypertension: Secondary | ICD-10-CM

## 2019-12-03 LAB — COMPREHENSIVE METABOLIC PANEL WITH GFR
ALT: 15 IU/L (ref 0–32)
AST: 23 IU/L (ref 0–40)
Albumin/Globulin Ratio: 1.6 (ref 1.2–2.2)
Albumin: 3.8 g/dL (ref 3.7–4.7)
Alkaline Phosphatase: 66 IU/L (ref 39–117)
BUN/Creatinine Ratio: 11 — ABNORMAL LOW (ref 12–28)
BUN: 9 mg/dL (ref 8–27)
Bilirubin Total: 0.5 mg/dL (ref 0.0–1.2)
CO2: 25 mmol/L (ref 20–29)
Calcium: 9 mg/dL (ref 8.7–10.3)
Chloride: 98 mmol/L (ref 96–106)
Creatinine, Ser: 0.8 mg/dL (ref 0.57–1.00)
GFR calc Af Amer: 81 mL/min/1.73
GFR calc non Af Amer: 70 mL/min/1.73
Globulin, Total: 2.4 g/dL (ref 1.5–4.5)
Glucose: 100 mg/dL — ABNORMAL HIGH (ref 65–99)
Potassium: 4.3 mmol/L (ref 3.5–5.2)
Sodium: 137 mmol/L (ref 134–144)
Total Protein: 6.2 g/dL (ref 6.0–8.5)

## 2019-12-03 LAB — HEMOGLOBIN A1C
Est. average glucose Bld gHb Est-mCnc: 123 mg/dL
Hgb A1c MFr Bld: 5.9 % — ABNORMAL HIGH (ref 4.8–5.6)

## 2019-12-03 LAB — LIPID PANEL
Chol/HDL Ratio: 2.4 ratio (ref 0.0–4.4)
Cholesterol, Total: 119 mg/dL (ref 100–199)
HDL: 50 mg/dL
LDL Chol Calc (NIH): 53 mg/dL (ref 0–99)
Triglycerides: 78 mg/dL (ref 0–149)
VLDL Cholesterol Cal: 16 mg/dL (ref 5–40)

## 2019-12-03 LAB — TSH: TSH: 3.14 u[IU]/mL (ref 0.450–4.500)

## 2019-12-03 LAB — CBC
Hematocrit: 34.9 % (ref 34.0–46.6)
Hemoglobin: 11.9 g/dL (ref 11.1–15.9)
MCH: 30.1 pg (ref 26.6–33.0)
MCHC: 34.1 g/dL (ref 31.5–35.7)
MCV: 88 fL (ref 79–97)
Platelets: 249 x10E3/uL (ref 150–450)
RBC: 3.95 x10E6/uL (ref 3.77–5.28)
RDW: 12.4 % (ref 11.7–15.4)
WBC: 5.7 x10E3/uL (ref 3.4–10.8)

## 2019-12-21 ENCOUNTER — Other Ambulatory Visit: Payer: Self-pay | Admitting: Internal Medicine

## 2019-12-21 DIAGNOSIS — E785 Hyperlipidemia, unspecified: Secondary | ICD-10-CM

## 2020-01-27 ENCOUNTER — Ambulatory Visit (INDEPENDENT_AMBULATORY_CARE_PROVIDER_SITE_OTHER): Payer: Medicare Other

## 2020-01-27 ENCOUNTER — Ambulatory Visit (INDEPENDENT_AMBULATORY_CARE_PROVIDER_SITE_OTHER): Payer: Medicare Other | Admitting: Family Medicine

## 2020-01-27 ENCOUNTER — Encounter: Payer: Self-pay | Admitting: Family Medicine

## 2020-01-27 ENCOUNTER — Other Ambulatory Visit: Payer: Self-pay

## 2020-01-27 DIAGNOSIS — M545 Low back pain, unspecified: Secondary | ICD-10-CM

## 2020-01-27 DIAGNOSIS — M25562 Pain in left knee: Secondary | ICD-10-CM

## 2020-01-27 MED ORDER — DICLOFENAC SODIUM 1 % EX GEL: 4.0000 g | Freq: Four times a day (QID) | CUTANEOUS | 11 refills | Status: DC | PRN

## 2020-01-27 MED ORDER — TRAMADOL HCL 50 MG PO TABS: 50.0000 mg | ORAL_TABLET | Freq: Every evening | ORAL | 0 refills | Status: DC | PRN

## 2020-01-27 NOTE — Patient Instructions (Signed)
   Glucosamine Sulfate:  1,000 mg twice daily  Turmeric:  500 mg twice daily   

## 2020-01-27 NOTE — Progress Notes (Signed)
Office Visit Note   Patient: Lynn [name]           Date of Birth: May 26, 1941           MRN: [id number] Visit Date: 01/27/2020 Requested by: Cain Saupe, MD [address] [place],  [place] [address] PCP: Cain Saupe, MD  Subjective: Chief Complaint  Patient presents with  . Lower Back - Pain  . Left Knee - Pain    HPI: She is here with low back and left greater than right knee pain.  Her back has been bothering her for about a month, no injury.  Midline lumbosacral pain worse when she tries to stand and walk, but she feels it most of the time.  No radicular symptoms, no bowel or bladder dysfunction, no fevers or chills and no urinary symptoms.  Her knees bother her primarily when she tries to walk.  Pain mostly on the medial aspect with no locking or giving way.  She has tried Tylenol in the past but it made her blood pressure go up.  She is not taking anything right now for her pain.               ROS:   All other systems were reviewed and are negative.  Objective: Vital Signs: There were no vitals taken for this visit.  Physical Exam:  General:  Alert and oriented, in no acute distress. Pulm:  Breathing unlabored. Psy:  Normal mood, congruent affect. Skin: No rash or erythema Low back: The area of pain is around L4-5 but she is not tender to palpation.  No pain in the sciatic notch, negative straight leg raise, lower extremity strength and reflexes are normal. Knees: Trace patellofemoral crepitus on the left.  No effusion in either knee.  Both knees are tender on the medial joint line, she has no laxity with varus or valgus stress.  No palpable click with McMurray's.  Imaging: XR Knee 1-2 Views Left  Result Date: 01/27/2020 X-rays knees: She has early patellofemoral spurs bilaterally.  Tibiofemoral joint looks good on the right, she has moderate narrowing on the left with periarticular spurring.  No sign of loose body.  XR Lumbar Spine 2-3  Views  Result Date: 01/27/2020 X-rays lumbar spine: She has moderate degenerative disc disease at multiple levels but moderate to severe at L4-5.  No sign of compression deformity or neoplasm.  Hip joints look good, no significant arthritis.   Assessment & Plan: 1.  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 -She will try glucosamine and turmeric.  Could contemplate physical therapy referral in the future, but she is concerned about COVID-19 and would prefer to avoid that for now.  2.  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  Refilled Voltaren gel.  Follow-up as needed.  Cortisone injection could be considered if symptoms worsen.     Procedures: No procedures performed  No notes on file     PMFS History: Patient Active Problem List   Diagnosis Date Noted  . Hyponatremia 11/19/2019  . 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 Prediabetes 11/19/2019  . Hypertension 08/03/2019  . 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 Bradycardia 03/01/2017  . 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 Hyperlipidemia 03/01/2017  . Edema 03/01/2017   Past Medical History:  Diagnosis Date  . Bradycardia 03/01/2017  . Edema 03/01/2017  . 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 Hyperlipidemia  03/01/2017  . Hypertension   . Memory loss   . Mild cognitive disorder 06/14/2018    Family History  Problem Relation Age of Onset  . Diverticulitis Mother   . Other Father        unsure  . Hypertension Sister     History reviewed. No pertinent surgical history. Social History   Occupational History  . Occupation: Retired  Tobacco Use  . Smoking status: Never Smoker  . Smokeless tobacco: Never Used  Substance and Sexual Activity  . Alcohol use: No  . Drug use: No  . Sexual activity: Not Currently

## 2020-01-27 NOTE — Progress Notes (Signed)
Lower back pain started 30 days  Bilateral knee pain

## 2020-02-05 ENCOUNTER — Other Ambulatory Visit: Payer: Self-pay | Admitting: Family Medicine

## 2020-02-05 DIAGNOSIS — I10 Essential (primary) hypertension: Secondary | ICD-10-CM

## 2020-05-24 ENCOUNTER — Ambulatory Visit: Payer: Medicare Other | Admitting: Orthopaedic Surgery

## 2020-05-30 ENCOUNTER — Ambulatory Visit: Payer: Medicare Other | Admitting: Family Medicine

## 2020-06-13 ENCOUNTER — Telehealth: Payer: Self-pay

## 2020-06-13 ENCOUNTER — Encounter: Payer: Self-pay | Admitting: Orthopaedic Surgery

## 2020-06-13 ENCOUNTER — Ambulatory Visit (INDEPENDENT_AMBULATORY_CARE_PROVIDER_SITE_OTHER): Payer: Medicare Other | Admitting: Orthopaedic Surgery

## 2020-06-13 ENCOUNTER — Other Ambulatory Visit: Payer: Self-pay

## 2020-06-13 DIAGNOSIS — M4807 Spinal stenosis, lumbosacral region: Secondary | ICD-10-CM | POA: Diagnosis not present

## 2020-06-13 DIAGNOSIS — M545 Low back pain, unspecified: Secondary | ICD-10-CM

## 2020-06-13 MED ORDER — DICLOFENAC SODIUM 1 % EX GEL: 4.0000 g | Freq: Four times a day (QID) | CUTANEOUS | 2 refills | Status: DC | PRN

## 2020-06-13 NOTE — Telephone Encounter (Signed)
Sent in

## 2020-06-13 NOTE — Addendum Note (Signed)
Addended by: Mardene Celeste B on: 06/13/2020 10:22 AM   Modules accepted: Orders

## 2020-06-13 NOTE — Progress Notes (Signed)
Office Visit Note   Patient: Lynn [name]           Date of Birth: 1941-09-07           MRN: [id number] Visit Date: 06/13/2020              Requested by: Cain Saupe, MD [address] [place],  [place] [address] PCP: Cain Saupe, MD   Assessment & Plan: Visit Diagnoses:  1. Bilateral low back pain without sciatica, unspecified chronicity     Plan: Due to that patient continues to have low back pain despite conservative measures recommend MRI for possible epidural steroid injection planning.  Questions were encouraged and answered by Dr. Magnus Ivan myself.  We will see her back after the MRI to go over the results and discuss further treatment.  Follow-Up Instructions: Return After MRI.   Orders:  No orders of the defined types were placed in this encounter.  No orders of the defined types were placed in this encounter.     Procedures: No procedures performed   Clinical Data: No additional findings.   Subjective: Chief Complaint  Patient presents with  . Lower Back - Pain    HPI Mrs. Lynn [name] returns today with her daughter for continued low back pain.  Daughter does interpret for her today.  She reports that her pain has not became any worse.  Pain is constant but does not awaken her.  No bowel bladder dysfunction no saddle anesthesia like symptoms.  She been taking ibuprofen which helps some and apply Voltaren gel to her back which also helps.  She denies any numbness tingling down either leg.  Pain is mostly the lower lumbar region radiates to the buttocks.  She did see Dr. 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 Mild degenerative disc changes at other levels.  No acute fractures. She also reports that she went to physical therapy for 2 visits but this made her low back pain worse.  She is not performing any type of home exercise program for her  low back.  She did not obtain the turmeric or glucosamine that was recommended by Dr. Prince Rome.  She tried tramadol for the pain but states this did not help with her low back pain. Review of Systems See HPI otherwise negative  Objective: Vital Signs: There were no vitals taken for this visit.  Physical Exam Constitutional:      Appearance: She is normal weight. She is not ill-appearing or diaphoretic.  Cardiovascular:     Pulses: Normal pulses.  Pulmonary:     Effort: Pulmonary effort is normal.  Neurological:     Mental Status: She is alert.  Psychiatric:        Mood and Affect: Mood normal.        Behavior: Behavior normal.     Ortho Exam Bilateral lower extremities x5 strength against resistance throughout.  Normal sensation bilateral feet to light touch.  Negative straight leg raise bilaterally.  She has full flexion of the lumbar spine without pain.  Discomfort with extension of the lumbar spine.  Tenderness with palpation over the lower lumbar paraspinous region on the right and left. Specialty Comments:  No specialty comments available.  Imaging: No results found.   PMFS History: Patient Active Problem List   Diagnosis Date Noted  . Hyponatremia 11/19/2019  . 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 Prediabetes  11/19/2019  . Hypertension 08/03/2019  . 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 Bradycardia 03/01/2017  . 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 Hyperlipidemia 03/01/2017  . Edema 03/01/2017   Past Medical History:  Diagnosis Date  . Bradycardia 03/01/2017  . Edema 03/01/2017  . 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 Hyperlipidemia 03/01/2017  . Hypertension   . Memory loss   . Mild cognitive disorder 06/14/2018    Family History  Problem Relation Age of Onset  . Diverticulitis Mother   . Other Father        unsure  . Hypertension Sister     History reviewed. No pertinent surgical history. Social History   Occupational  History  . Occupation: Retired  Tobacco Use  . Smoking status: Never Smoker  . Smokeless tobacco: Never Used  Vaping Use  . Vaping Use: Never used  Substance and Sexual Activity  . Alcohol use: No  . Drug use: No  . Sexual activity: Not Currently

## 2020-06-13 NOTE — Telephone Encounter (Signed)
Would like a Rx for Voltaren Gel sent to her pharmacy for her pain?  Cb# [phone].  Please advise.  Thank you.

## 2020-06-22 ENCOUNTER — Other Ambulatory Visit: Payer: Self-pay | Admitting: Family Medicine

## 2020-06-22 DIAGNOSIS — E785 Hyperlipidemia, unspecified: Secondary | ICD-10-CM

## 2020-06-22 NOTE — Telephone Encounter (Signed)
Requested Prescriptions  Pending Prescriptions Disp Refills  . atorvastatin (LIPITOR) 10 MG tablet [Pharmacy Med Name: ATORVASTATIN 10 MG TABLET] 90 tablet 1    Sig: TAKE 1 TABLET (10 MG TOTAL) BY MOUTH DAILY AT 6 PM.     Cardiovascular:  Antilipid - Statins Failed - 06/22/2020 10:45 AM      Failed - LDL in normal range and within 360 days    LDL Chol Calc (NIH)  Date Value Ref Range Status  12/02/2019 53 0 - 99 mg/dL Final         Passed - Total Cholesterol in normal range and within 360 days    Cholesterol, Total  Date Value Ref Range Status  12/02/2019 119 100 - 199 mg/dL Final         Passed - HDL in normal range and within 360 days    HDL  Date Value Ref Range Status  12/02/2019 50 >39 mg/dL Final         Passed - Triglycerides in normal range and within 360 days    Triglycerides  Date Value Ref Range Status  12/02/2019 78 0 - 149 mg/dL Final         Passed - Patient is not pregnant      Passed - Valid encounter within last 12 months    Recent Outpatient Visits          7 months ago Essential hypertension   [facility] [place], [place], [place]   11 months ago Weight loss, non-intentional   [facility] Marcine Matar, [place]   1 year ago Essential hypertension   [facility] [place], [place], [place]   1 year ago Essential hypertension   [facility] [place], [place], [place]   1 year ago Essential hypertension   [facility] , [place], [place]      Future Appointments            In 2 weeks Magnus Ivan, Vanita Panda, MD [facility]

## 2020-07-02 ENCOUNTER — Ambulatory Visit: Payer: Medicare Other | Admitting: Orthopaedic Surgery

## 2020-07-04 ENCOUNTER — Other Ambulatory Visit: Payer: Medicare Other

## 2020-07-08 ENCOUNTER — Ambulatory Visit
Admission: RE | Admit: 2020-07-08 | Discharge: 2020-07-08 | Disposition: A | Discharge status: Home / Self Care | Payer: Medicare Other | Source: Ambulatory Visit | Attending: Physician Assistant | Admitting: Physician Assistant

## 2020-07-08 ENCOUNTER — Other Ambulatory Visit: Payer: Self-pay

## 2020-07-08 DIAGNOSIS — M4807 Spinal stenosis, lumbosacral region: Secondary | ICD-10-CM

## 2020-07-09 ENCOUNTER — Encounter: Payer: Self-pay | Admitting: Orthopaedic Surgery

## 2020-07-09 ENCOUNTER — Ambulatory Visit (INDEPENDENT_AMBULATORY_CARE_PROVIDER_SITE_OTHER): Payer: Medicare Other | Admitting: Orthopaedic Surgery

## 2020-07-09 DIAGNOSIS — M545 Low back pain, unspecified: Secondary | ICD-10-CM

## 2020-07-09 DIAGNOSIS — M4807 Spinal stenosis, lumbosacral region: Secondary | ICD-10-CM | POA: Diagnosis not present

## 2020-07-09 NOTE — Progress Notes (Signed)
The patient is a 79 year old female who comes in with her daughter today to go over a MRI of her lumbar spine.  She only points to her low back to the source of her pain.  It has radiated down her legs before but right now does not really radiate in any direction other than just across her low back and maybe a little bit into the pelvis.  She does wear a back lumbar support brace or corset.  She denies any weakness in her legs.  Do not walk with an assistive device.  She denies any numbness and tingling in her feet.  On exam today there is no significant weakness in either lower extremity and normal sensation.  She does not seem to have a straight leg raise that is positive bilaterally at all.  Her pain seems to be across the lower lumbar spine.  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  There is also mild to moderate right neuroforaminal stenosis at L5-S1.  Based on her clinical exam and MRI findings, I would like to send her to Dr. Alvester Morin to consider bilateral facet joint injections at most likely L4-L5.  Based on her clinical exam I do not think that an epidural steroid injection would be as worthwhile as a facet joint injection.  However, I will see with Dr. Alvester Morin thinks is well.  All question concerns were answered and addressed.  The family was interested in pursuing an intervention such as this.

## 2020-07-16 ENCOUNTER — Telehealth: Payer: Self-pay

## 2020-07-16 NOTE — Telephone Encounter (Signed)
Patient called in wanting to know if dr newton has any\thing on 11/09 , 11/04 between 10:00am-11:00am

## 2020-07-16 NOTE — Telephone Encounter (Signed)
Rescheduled

## 2020-07-19 ENCOUNTER — Ambulatory Visit: Payer: Medicare Other | Attending: Family Medicine | Admitting: Family Medicine

## 2020-07-19 ENCOUNTER — Other Ambulatory Visit: Payer: Self-pay

## 2020-07-19 ENCOUNTER — Encounter: Payer: Self-pay | Admitting: Family Medicine

## 2020-07-19 VITALS — BP 145/82 | HR 67 | Temp 97.0°F | Wt 120.0 lb

## 2020-07-19 DIAGNOSIS — Z758 Other problems related to medical facilities and other health care: Secondary | ICD-10-CM

## 2020-07-19 DIAGNOSIS — Z789 Other specified health status: Secondary | ICD-10-CM

## 2020-07-19 DIAGNOSIS — R7303 Prediabetes: Secondary | ICD-10-CM

## 2020-07-19 DIAGNOSIS — G8929 Other chronic pain: Secondary | ICD-10-CM

## 2020-07-19 DIAGNOSIS — E785 Hyperlipidemia, unspecified: Secondary | ICD-10-CM

## 2020-07-19 DIAGNOSIS — Z79899 Other long term (current) drug therapy: Secondary | ICD-10-CM

## 2020-07-19 DIAGNOSIS — Z1331 Encounter for screening for depression: Secondary | ICD-10-CM

## 2020-07-19 DIAGNOSIS — R350 Frequency of micturition: Secondary | ICD-10-CM

## 2020-07-19 DIAGNOSIS — R600 Localized edema: Secondary | ICD-10-CM

## 2020-07-19 DIAGNOSIS — R634 Abnormal weight loss: Secondary | ICD-10-CM

## 2020-07-19 DIAGNOSIS — Z23 Encounter for immunization: Secondary | ICD-10-CM | POA: Diagnosis not present

## 2020-07-19 DIAGNOSIS — M2550 Pain in unspecified joint: Secondary | ICD-10-CM

## 2020-07-19 DIAGNOSIS — I10 Essential (primary) hypertension: Secondary | ICD-10-CM

## 2020-07-19 DIAGNOSIS — Z603 Acculturation difficulty: Secondary | ICD-10-CM

## 2020-07-19 DIAGNOSIS — Z1159 Encounter for screening for other viral diseases: Secondary | ICD-10-CM

## 2020-07-19 DIAGNOSIS — M810 Age-related osteoporosis without current pathological fracture: Secondary | ICD-10-CM

## 2020-07-19 DIAGNOSIS — M545 Low back pain, unspecified: Secondary | ICD-10-CM

## 2020-07-19 LAB — POCT URINALYSIS DIP (CLINITEK)
Bilirubin, UA: NEGATIVE
Glucose, UA: NEGATIVE mg/dL
Ketones, POC UA: NEGATIVE mg/dL
Leukocytes, UA: NEGATIVE
Nitrite, UA: NEGATIVE
POC PROTEIN,UA: NEGATIVE
Spec Grav, UA: 1.015
Urobilinogen, UA: 0.2 U/dL
pH, UA: 7

## 2020-07-19 MED ORDER — LOSARTAN POTASSIUM 50 MG PO TABS: 50.0000 mg | ORAL_TABLET | Freq: Two times a day (BID) | ORAL | 1 refills | Status: DC

## 2020-07-19 MED ORDER — METOPROLOL TARTRATE 25 MG PO TABS: 12.5000 mg | ORAL_TABLET | Freq: Two times a day (BID) | ORAL | 1 refills | Status: DC

## 2020-07-19 NOTE — Progress Notes (Signed)
Established Patient Office Visit  Subjective:  Patient ID: Lynn [name], female    DOB: 26-Oct-1940  Age: 79 y.o. MRN: [id number]  CC:  Chief Complaint  Patient presents with  . Follow-up    HPI Lynn [name], 79 yo female seen in follow-up of chronic medical issues including hypertension, hyperlipidemia, prediabetes,  and diagnosis of moderate dementia per neurology, and at today's visit, daughter reports that patient continues to have weight loss without trying. Daughter does not believe the patient has had any change in her appetite, no loss of appetite. Patient has also had recent increase in generalized joint pain as well as low back pain for which she was recently seen by orthopedics and is scheduled to receive injections to help with the pain.          Patient reports that she is taking her medications and needs refills of metoprolol and losartan. Daughter and patient reports that patient's blood pressure is usually good in the mornings but sometimes later in the day, blood pressure is anywhere from 150s to 170s for the top number and patient states that she occasionally takes a dose of amlodipine when her blood pressures are higher but she cannot take amlodipine daily because this causes an increase in peripheral edema. She reports that she has noticed some recent mild swelling in her lower legs. On review of systems, she has also had increased back pain, some recent increase in urinary frequency. She has had no fever or chills. No abdominal pain-no nausea/vomiting/diarrhea or constipation. No chest pain or palpitations, no shortness of breath or cough and no headaches other than when blood pressure is elevated. No dizziness.  Past Medical History:  Diagnosis Date  . Bradycardia 03/01/2017  . Edema 03/01/2017  . 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 Hyperlipidemia 03/01/2017  . Hypertension   . Memory loss   . Mild cognitive disorder 06/14/2018     Family  History  Problem Relation Age of Onset  . Diverticulitis Mother   . Other Father        unsure  . Hypertension Sister     Social History   Socioeconomic History  . Marital status: Widowed    Spouse name: Not on file  . Number of children: 2  . Years of education: some college  . Highest education level: Not on file  Occupational History  . Occupation: Retired  Tobacco Use  . Smoking status: Never Smoker  . Smokeless tobacco: Never Used  Vaping Use  . Vaping Use: Never used  Substance and Sexual Activity  . Alcohol use: No  . Drug use: No  . Sexual activity: Not Currently  Other Topics Concern  . 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 Difficulty of Paying Living Expenses: Not on file  Food Insecurity:   . Worried About Programme researcher, broadcasting/film/videounning Out of Food in the Last Year: Not on file  . Ran Out of Food in the Last Year: Not on file  Transportation Needs:   . Lack of Transportation (Medical): Not on file  . Lack of Transportation (Non-Medical): Not on file  Physical Activity:   .  Days of Exercise per Week: Not on file  . Minutes of Exercise per Session: Not on file  Stress:   . Feeling of Stress : Not on file  Social Connections:   . Frequency of Communication with Friends and Family: Not on file  . Frequency of Social Gatherings with Friends and Family: Not on file  . Attends Religious Services: Not on file  . Active Member of Clubs or Organizations: Not on file  . Attends Banker Meetings: Not on file  . Marital Status: Not on file  Intimate Partner Violence:   . Fear of Current or Ex-Partner: Not on file  . Emotionally Abused: Not on file  . Physically Abused:  Not on file  . Sexually Abused: Not on file    Outpatient Medications Prior to Visit  Medication Sig Dispense Refill  . amLODipine (NORVASC) 2.5 MG tablet Take 1 tablet (2.5 mg total) by mouth daily. 90 tablet 1  . atorvastatin (LIPITOR) 10 MG tablet TAKE 1 TABLET (10 MG TOTAL) BY MOUTH DAILY AT 6 PM. 90 tablet 1  . atorvastatin (LIPITOR) 20 MG tablet Take 20 mg by mouth daily.    . diclofenac Sodium (VOLTAREN) 1 % GEL Apply 4 g topically 4 (four) times daily as needed. 500 g 2  . hydrOXYzine (ATARAX/VISTARIL) 10 MG tablet Take 1 tablet (10 mg total) by mouth 3 (three) times daily as needed for anxiety. 30 tablet 1  . ibuprofen (ADVIL,MOTRIN) 200 MG tablet Take 200 mg by mouth every 6 (six) hours as needed.    [name] losartan (COZAAR) 50 MG tablet TAKE 1 TABLET (50 MG TOTAL) BY MOUTH 2 (TWO) TIMES DAILY. TO LOWER BLOOD PRESSURE 180 tablet 0  . metoprolol tartrate (LOPRESSOR) 25 MG tablet Take 0.5 tablets (12.5 mg total) by mouth 2 (two) times daily. To lower blood pressure 180 tablet 1  . Pyridoxine HCl (VITAMIN B-6 PO) Take by mouth.    . Thiamine HCl (VITAMIN B-1 PO) Take by mouth.    . hydrochlorothiazide (HYDRODIURIL) 25 MG tablet Take 1 tablet (25 mg total) by mouth daily. 90 tablet 3   No facility-administered medications prior to visit.    No Known Allergies  ROS Review of Systems  Constitutional: Positive for fatigue. Negative for chills and fever.  HENT: Negative for sore throat and trouble swallowing.   Eyes: Negative for photophobia and visual disturbance.  Respiratory: Negative for cough and shortness of breath.   Cardiovascular: Positive for leg swelling. Negative for chest pain and palpitations.  Gastrointestinal: Negative for abdominal pain, constipation, diarrhea and nausea.  Endocrine: Negative for polydipsia, polyphagia and polyuria.  Genitourinary: Positive for frequency. Negative for dysuria.  Musculoskeletal: Positive for arthralgias and back pain.  Neurological:  Positive for headaches. Negative for dizziness.  Hematological: Negative for adenopathy. Does not bruise/bleed easily.      Objective:    Physical Exam Vitals and nursing note reviewed.  Constitutional:      Appearance: Normal appearance.  Neck:     Comments: No thyromegaly or noticeable asymmetry Cardiovascular:     Rate and Rhythm: Normal rate and regular rhythm.  Abdominal:     Palpations: Abdomen is soft.     Tenderness: There is no abdominal tenderness. There is left CVA tenderness (patient with mild withdrawal with palpation in this area). There is no right CVA tenderness, guarding or rebound.  Musculoskeletal:        General: Tenderness present.     Cervical back: Normal range of  motion and neck supple. No tenderness.     Right lower leg: Edema present.     Left lower leg: Edema present.     Comments: Patient is very sensitive to light touch over the thoracic and lumbar spine; some thoracic and lumbar paraspinous spasm  Lymphadenopathy:     Cervical: No cervical adenopathy.  Skin:    General: Skin is warm and dry.  Neurological:     Mental Status: She is alert. Mental status is at baseline.     Cranial Nerves: No cranial nerve deficit.  Psychiatric:        Mood and Affect: Mood normal.        Behavior: Behavior normal.     BP (!) 145/82 (BP Location: Right Arm, Patient Position: Sitting)   Pulse 67   Temp (!) 97 F (36.1 C)   Wt 120 lb (54.4 kg)   SpO2 98%   BMI 22.67 kg/m  Wt Readings from Last 3 Encounters:  07/19/20 120 lb (54.4 kg)  11/18/19 122 lb (55.3 kg)  07/20/19 122 lb (55.3 kg)     Health Maintenance Due  Topic Date Due  . Hepatitis C Screening  Never done    Lab Results  Component Value Date   TSH 3.140 12/02/2019   Lab Results  Component Value Date   WBC 5.7 12/02/2019   HGB 11.9 12/02/2019   HCT 34.9 12/02/2019   MCV 88 12/02/2019   PLT 249 12/02/2019   Lab Results  Component Value Date   NA 137 12/02/2019   K 4.3 12/02/2019    CO2 25 12/02/2019   GLUCOSE 100 (H) 12/02/2019   BUN 9 12/02/2019   CREATININE 0.80 12/02/2019   BILITOT 0.5 12/02/2019   ALKPHOS 66 12/02/2019   AST 23 12/02/2019   ALT 15 12/02/2019   PROT 6.2 12/02/2019   ALBUMIN 3.8 12/02/2019   CALCIUM 9.0 12/02/2019   Lab Results  Component Value Date   CHOL 119 12/02/2019   Lab Results  Component Value Date   HDL 50 12/02/2019   Lab Results  Component Value Date   LDLCALC 53 12/02/2019   Lab Results  Component Value Date   TRIG 78 12/02/2019   Lab Results  Component Value Date   CHOLHDL 2.4 12/02/2019   Lab Results  Component Value Date   HGBA1C 5.9 (H) 12/02/2019      Assessment & Plan:  1. Need for immunization against influenza Patient received influenza immunization at today's visit along with educational material regarding the immunization. - Flu Vaccine QUAD 36+ mos IM  2. Essential hypertension Daughter reports that patient's blood pressure is labile.  Refills provided of losartan and metoprolol as patient does not have additional refills.  Daughter has also been asked to make an appointment with the clinical pharmacist and bring home blood pressure monitor to make sure that this is reading accurately.  Also bring blood pressure diary and has daughter reports that blood pressure is usually within normal in the mornings but elevated later in the day and adjustments may need to be made to her regimen of blood pressure medications. - losartan (COZAAR) 50 MG tablet; Take 1 tablet (50 mg total) by mouth 2 (two) times daily. To lower blood pressure  Dispense: 180 tablet; Refill: 1 - metoprolol tartrate (LOPRESSOR) 25 MG tablet; Take 0.5 tablets (12.5 mg total) by mouth 2 (two) times daily. To lower blood pressure  Dispense: 180 tablet; Refill: 1  3. Hyperlipidemia, unspecified hyperlipidemia type She  is currently on atorvastatin.  She will have comprehensive metabolic panel in follow-up of use of statin medication. -  Comprehensive metabolic panel  4. Weight loss, non-intentional; Positive Depression Screen Patient with continued unintentional weight loss.  Daughter reports the patient has had no loss of appetite or decreased appetite.  Patient will have thyroid panel to look for any possible thyroid issues that could contribute to weight loss and will also have comprehensive metabolic panel to look for electrolyte abnormalities or liver disorder which may be contributing.  Patient also has positive depression screen.  If weight loss continues, she may benefit from medication such as Remeron which can also help stimulate appetite in addition to treating depressive symptoms. - Thyroid Panel With TSH - Comprehensive metabolic panel  5. Mild peripheral edema Patient with mild peripheral edema today's visit.  Will check creatinine as part of competence of metabolic panel as well as checking thyroid panel to look for possible cause.  Patient also reports that her swelling tends to get worse with use of amlodipine which she currently takes on an as-needed basis.  Patient may have some nutritional deficiency/low albumin which could be contributing to edema and this will be checked as part of LFTs.  6. Chronic midline low back pain, unspecified whether sciatica present 7. Pain, joint, multiple sites Patient's notes from her recent visit with orthopedics reviewed at today's visit.  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  Patient also with recent complaints per daughter of generalized joint pain and on exam, patient with increased sensitivity to palpation of the thoracic and lumbar spine.  Will check vitamin D level as patient also with history of osteoporosis and patient will also have arthritis panel to look for other causes of her recent increase in joint pain.  Patient is to continue scheduled follow-up with  orthopedics for injections to help with her chronic back pain. - Vitamin D, 25-hydroxy - Arthritis Panel  8. Urinary frequency Patient reports recent increase in urinary frequency and has history of prediabetes.  She will have urinalysis and urine culture and discussed with the daughter that elderly patients can have urinary tract infections without significant symptoms but will often cause patients to feel bad and lose their appetite therefore urinalysis will be checked as well as urine culture to look for urinary tract infection.  She will also have hemoglobin A1c in follow-up of prediabetes. - POCT URINALYSIS DIP (CLINITEK) - Urine Culture  9. Age-related osteoporosis without current pathological fracture  Patient with recent increase in generalized back pain as well as other generalized joint pain.  Will check vitamin D level as patient has had osteoporosis by prior DEXA scan.  She is currently being followed by orthopedics for back pain. - Vitamin D, 25-hydroxy  10. Encounter for hepatitis C screening test for low risk patient Patient with unexplained weight loss and will have screening test for hepatitis C antibody. - Hepatitis C Antibody  11. Prediabetes Patient has had prior elevated hemoglobin A1c and reports unexplained/unintentional weight loss and some mild increase in urinary frequency.  We will repeat hemoglobin A1c at today's visit. - Hemoglobin A1c  12. Encounter for long-term current use of medication Comprehensive metabolic panel will be done in follow-up of her use of medications for treatment of hypertension which can cause issues with potassium and renal function as well as LFTs in follow-up of use of statin medication for treatment  of hyperlipidemia. - Comprehensive metabolic panel  13. Language barrier Daughter assisted with interpretation at today's visit.    Follow-up: Return in about 3 months (around 10/19/2020) for chronic issues; sooner if needed; make appt  with Luke/CPP for BP check.   Cain Saupe, MD

## 2020-07-19 NOTE — Progress Notes (Signed)
CONCERNED ABOUT WEIGHT LOSS LOST 7LBS IN PAST YEAR  OVERSEAS PROVIDER SUGGESTED CHECK TI-RAD SCORE   NEEDS REFILL OF ALL MEDS WANTS FLU VACCINE

## 2020-07-20 LAB — COMPREHENSIVE METABOLIC PANEL WITH GFR
ALT: 15 IU/L (ref 0–32)
AST: 26 IU/L (ref 0–40)
Albumin/Globulin Ratio: 1.7 (ref 1.2–2.2)
Albumin: 4.6 g/dL (ref 3.7–4.7)
Alkaline Phosphatase: 75 IU/L (ref 44–121)
BUN/Creatinine Ratio: 19 (ref 12–28)
BUN: 16 mg/dL (ref 8–27)
Bilirubin Total: 0.6 mg/dL (ref 0.0–1.2)
CO2: 27 mmol/L (ref 20–29)
Calcium: 9.8 mg/dL (ref 8.7–10.3)
Chloride: 93 mmol/L — ABNORMAL LOW (ref 96–106)
Creatinine, Ser: 0.86 mg/dL (ref 0.57–1.00)
GFR calc Af Amer: 74 mL/min/1.73
GFR calc non Af Amer: 64 mL/min/1.73
Globulin, Total: 2.7 g/dL (ref 1.5–4.5)
Glucose: 113 mg/dL — ABNORMAL HIGH (ref 65–99)
Potassium: 4.3 mmol/L (ref 3.5–5.2)
Sodium: 134 mmol/L (ref 134–144)
Total Protein: 7.3 g/dL (ref 6.0–8.5)

## 2020-07-20 LAB — THYROID PANEL WITH TSH
Free Thyroxine Index: 2.7 (ref 1.2–4.9)
T3 Uptake Ratio: 27 % (ref 24–39)
T4, Total: 10.1 ug/dL (ref 4.5–12.0)
TSH: 3.65 u[IU]/mL (ref 0.450–4.500)

## 2020-07-20 LAB — ARTHRITIS PANEL
Anti Nuclear Antibody (ANA): NEGATIVE
Rheumatoid fact SerPl-aCnc: <10 [IU]/mL (ref 0.0–13.9)
Sed Rate: 5 mm/h (ref 0–40)
Uric Acid: 3.5 mg/dL (ref 3.1–7.9)

## 2020-07-20 LAB — VITAMIN D 25 HYDROXY (VIT D DEFICIENCY, FRACTURES): Vit D, 25-Hydroxy: 49.6 ng/mL (ref 30.0–100.0)

## 2020-07-20 LAB — HEPATITIS C ANTIBODY: Hep C Virus Ab: <0.1 {s_co_ratio} (ref 0.0–0.9)

## 2020-07-22 LAB — URINE CULTURE

## 2020-07-23 ENCOUNTER — Other Ambulatory Visit: Payer: Self-pay | Admitting: Family Medicine

## 2020-07-23 DIAGNOSIS — E785 Hyperlipidemia, unspecified: Secondary | ICD-10-CM

## 2020-08-02 ENCOUNTER — Ambulatory Visit: Payer: Medicare Other | Admitting: Physical Medicine and Rehabilitation

## 2020-08-08 ENCOUNTER — Telehealth: Payer: Self-pay

## 2020-08-08 ENCOUNTER — Other Ambulatory Visit: Payer: Self-pay | Admitting: Orthopaedic Surgery

## 2020-08-08 MED ORDER — GABAPENTIN 100 MG PO CAPS: 100.0000 mg | ORAL_CAPSULE | Freq: Three times a day (TID) | ORAL | 2 refills | Status: DC

## 2020-08-08 NOTE — Telephone Encounter (Signed)
Patient aware.

## 2020-08-08 NOTE — Telephone Encounter (Signed)
Patient called in because she changes her mind about getting injection , says she would like to take the other option for getting something prescribed.

## 2020-08-08 NOTE — Telephone Encounter (Signed)
See below

## 2020-08-08 NOTE — Telephone Encounter (Signed)
I will send in some gabapentin to their pharmacy to see if this will help in terms of pain with numbness and tingling.

## 2020-08-09 ENCOUNTER — Ambulatory Visit: Payer: Medicare Other | Admitting: Physical Medicine and Rehabilitation

## 2020-08-23 ENCOUNTER — Ambulatory Visit: Payer: Self-pay | Admitting: *Deleted

## 2020-08-23 NOTE — Telephone Encounter (Signed)
Patient's daughter called and wanted to see PCP tomorrow due to patient's mother c/o chest heaviness and radiating in her back. Chest heaviness started 2 nights ago and lasted approx. 30 seconds and went away now constant. Reports "a little" SOB denies dizziness, lightheadedness now, no difficulty breathing. Patient's daughter checked patient's B/P for 183/77 HR 65. Patient's daughter attempted to get appt with cardiologist and no appt available until 08/27/20. Instructed patient to take patient to ED and patient's daughter requesting patient to be seen by a physician close to their location.Reviewed process for establishing new patient. Reviewed with patient's daughter that no available appt tomorrow at this time and to go to ED. Care advise given. Patient's daughter verbalized understanding of care advise and to go to ED or UC if symptoms worsen and patient's daughter is upset her mother could not be seen sooner.  Reason for Disposition . [1] Chest pain lasts > 5 minutes AND [2] occurred in past 3 days (72 hours) (Exception: feels exactly the same as previously diagnosed heartburn and has accompanying sour taste in mouth)  Answer Assessment - Initial Assessment Questions 1. LOCATION: "Where does it hurt?"       Heaviness in chest and back  2. RADIATION: "Does the pain go anywhere else?" (e.g., into neck, jaw, arms, back)     no 3. ONSET: "When did the chest pain begin?" (Minutes, hours or days)      2 days ago  4. PATTERN "Does the pain come and go, or has it been constant since it started?"  "Does it get worse with exertion?"      Constant  5. DURATION: "How long does it last" (e.g., seconds, minutes, hours)     30 seconds or more and now constant  6. SEVERITY: "How bad is the pain?"  (e.g., Scale 1-10; mild, moderate, or severe)    - MILD (1-3): doesn't interfere with normal activities     - MODERATE (4-7): interferes with normal activities or awakens from sleep    - SEVERE (8-10): excruciating  pain, unable to do any normal activities       Mild - moderate  7. CARDIAC RISK FACTORS: "Do you have any history of heart problems or risk factors for heart disease?" (e.g., angina, prior heart attack; diabetes, high blood pressure, high cholesterol, smoker, or strong family history of heart disease)     HTN, B/P 183/77 HR 65 8. PULMONARY RISK FACTORS: "Do you have any history of lung disease?"  (e.g., blood clots in lung, asthma, emphysema, birth control pills)     No  9. CAUSE: "What do you think is causing the chest pain?"     Unknown  10. OTHER SYMPTOMS: "Do you have any other symptoms?" (e.g., dizziness, nausea, vomiting, sweating, fever, difficulty breathing, cough)       Some SOB was dizzy 2 nights ago   11. PREGNANCY: "Is there any chance you are pregnant?" "When was your last menstrual period?"       na  Protocols used: CHEST PAIN-A-AH

## 2020-08-24 ENCOUNTER — Ambulatory Visit: Payer: Self-pay

## 2020-08-24 ENCOUNTER — Other Ambulatory Visit: Payer: Self-pay

## 2020-08-24 ENCOUNTER — Emergency Department (HOSPITAL_COMMUNITY): Payer: Medicare Other

## 2020-08-24 ENCOUNTER — Emergency Department (HOSPITAL_COMMUNITY)
Admission: EM | Admit: 2020-08-24 | Discharge: 2020-08-24 | Disposition: A | Discharge status: Home / Self Care | Payer: Medicare Other | Attending: Emergency Medicine | Admitting: Emergency Medicine

## 2020-08-24 ENCOUNTER — Encounter (HOSPITAL_COMMUNITY): Payer: Self-pay

## 2020-08-24 ENCOUNTER — Ambulatory Visit (HOSPITAL_COMMUNITY)
Admission: EM | Admit: 2020-08-24 | Discharge: 2020-08-24 | Disposition: A | Discharge status: Home / Self Care | Payer: Medicare Other | Attending: Internal Medicine | Admitting: Internal Medicine

## 2020-08-24 ENCOUNTER — Encounter (HOSPITAL_COMMUNITY): Payer: Self-pay | Admitting: Emergency Medicine

## 2020-08-24 DIAGNOSIS — I10 Essential (primary) hypertension: Secondary | ICD-10-CM | POA: Insufficient documentation

## 2020-08-24 DIAGNOSIS — R0789 Other chest pain: Secondary | ICD-10-CM | POA: Diagnosis present

## 2020-08-24 DIAGNOSIS — R079 Chest pain, unspecified: Secondary | ICD-10-CM | POA: Diagnosis not present

## 2020-08-24 DIAGNOSIS — Z5321 Procedure and treatment not carried out due to patient leaving prior to being seen by health care provider: Secondary | ICD-10-CM | POA: Insufficient documentation

## 2020-08-24 LAB — TROPONIN I (HIGH SENSITIVITY): Troponin I (High Sensitivity): 4 ng/L (ref <18)

## 2020-08-24 LAB — CBC
HCT: 40.4 % (ref 36.0–46.0)
Hemoglobin: 12.8 g/dL (ref 12.0–15.0)
MCH: 29.3 pg (ref 26.0–34.0)
MCHC: 31.7 g/dL (ref 30.0–36.0)
MCV: 92.4 fL (ref 80.0–100.0)
Platelets: 263 10*3/uL (ref 150–400)
RBC: 4.37 MIL/uL (ref 3.87–5.11)
RDW: 12.6 % (ref 11.5–15.5)
WBC: 7.3 10*3/uL (ref 4.0–10.5)
nRBC: 0 % (ref 0.0–0.2)

## 2020-08-24 LAB — BASIC METABOLIC PANEL
Anion gap: 12 (ref 5–15)
BUN: 16 mg/dL (ref 8–23)
CO2: 26 mmol/L (ref 22–32)
Calcium: 9.3 mg/dL (ref 8.9–10.3)
Chloride: 94 mmol/L — ABNORMAL LOW (ref 98–111)
Creatinine, Ser: 0.84 mg/dL (ref 0.44–1.00)
GFR, Estimated: >60 mL/min (ref >60)
Glucose, Bld: 101 mg/dL — ABNORMAL HIGH (ref 70–99)
Potassium: 3.6 mmol/L (ref 3.5–5.1)
Sodium: 132 mmol/L — ABNORMAL LOW (ref 135–145)

## 2020-08-24 IMAGING — DX DG CHEST 2V
2 series · 2 of 2 positions shown · non-contrast
Comparison: [DATE]

CLINICAL DATA: Chest pain

EXAM:
CHEST - 2 VIEW

[chest pa]
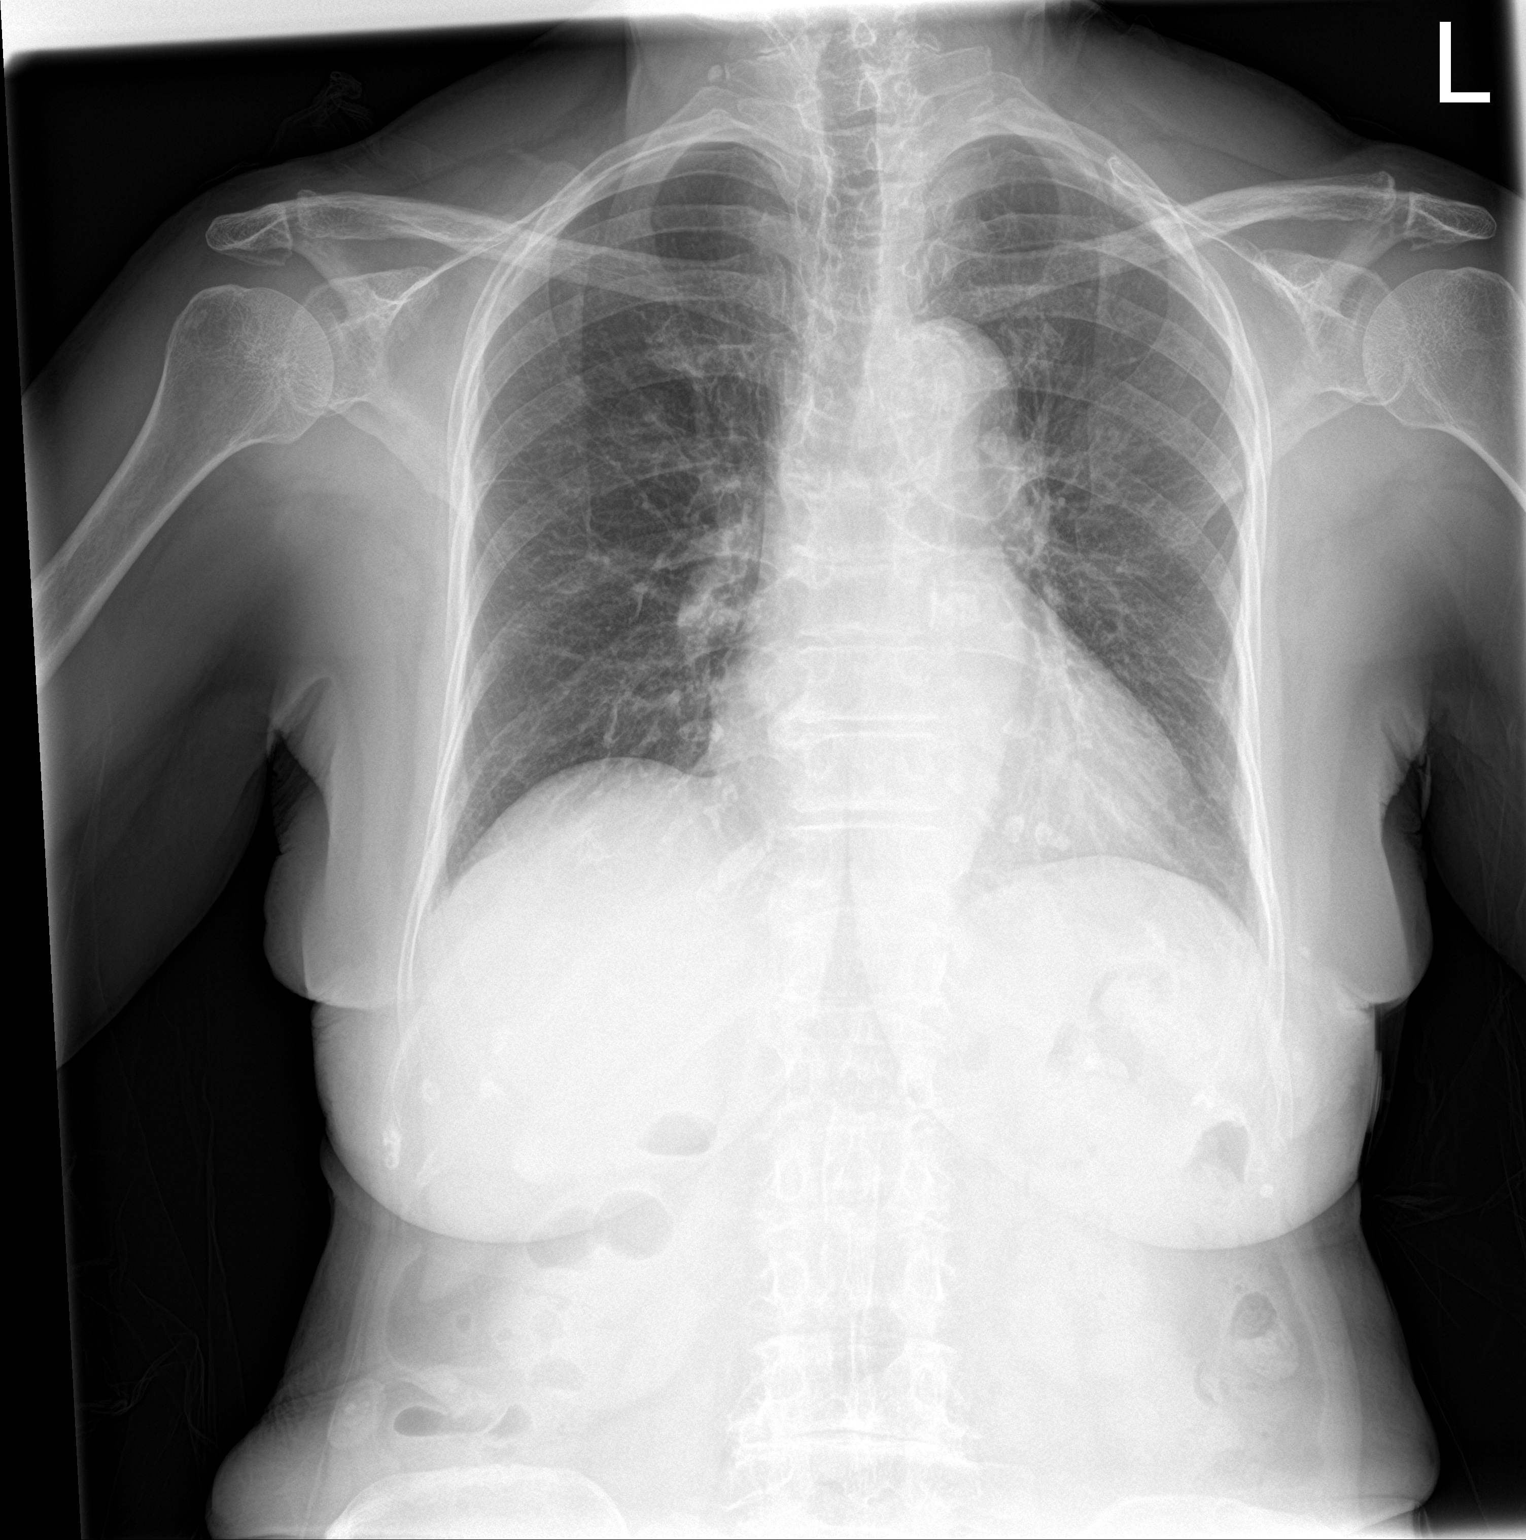

[chest lat]
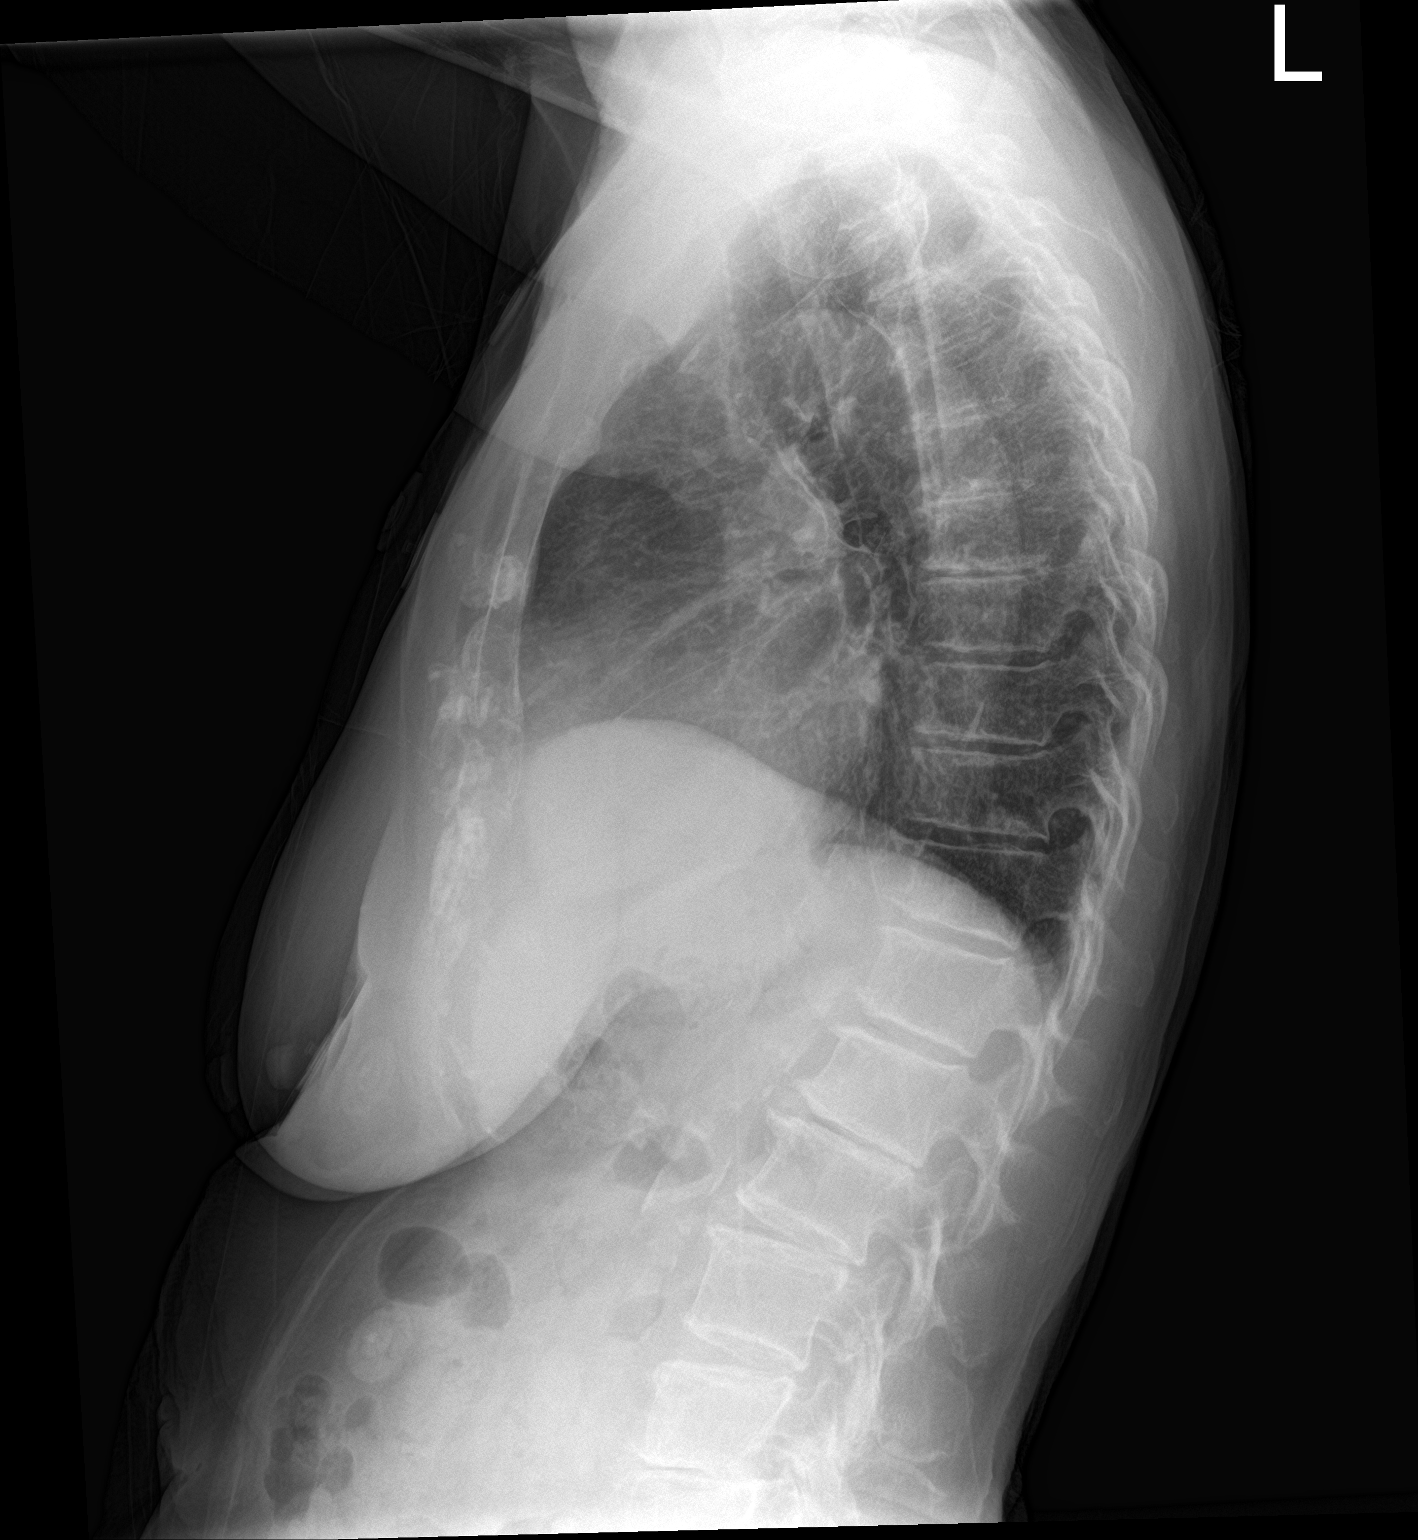

[2 of 2 positions shown; findings below may reference images not displayed]

FINDINGS: Mild hyperexpansion. Interstitial markings are diffusely coarsened
with chronic features. Cardiopericardial silhouette is at upper
limits of normal for size. The visualized bony structures of the
thorax show no acute abnormality.
IMPRESSION: No active cardiopulmonary disease.

## 2020-08-24 NOTE — ED Triage Notes (Signed)
Pt accompanied by daughter who reports pt having intermittent chest pressure for the past 3 days, pain radiates to her back. Pt also having episodes of HTN. Pt currently on medication for HTN. Pt a.o, denies pain at this time.

## 2020-08-24 NOTE — ED Triage Notes (Signed)
Patient presents to [facility] for assessment of chest pressure and elevated blood pressure x 3 days.  Happens every night around 7pm, with a few hours of high readings.  Patient's daughter states she has pills she can put under her tongue to help, which worked until last night when she had pressure, dizziness, back pain for several hours.  Patient states today she is c/o extreme fatigue, confusion, and intermittent episodes of dizziness along with chest pressure.

## 2020-08-24 NOTE — Telephone Encounter (Signed)
Patient daughter called back and she states that her mother has chest heaviness.  She states that it is center chest and into her back.  She states that her mother has had this coming and going since Tuesday night. She states her mother states that something is not right.  She states her mother is weak tired and mildly SOB.  She states it started out lasting only a few seconds to now 2-3 hours.  Daughter was instructed to go to ER in triage last night and this morning by care provider in office. I offered to call 911 but daughter asked for address and will drive her mother. Will rout information to office.  Reason for Disposition . [1] Chest pain (or "angina") comes and goes AND [2] is happening more often (increasing in frequency) or getting worse (increasing in severity) (Exception: chest pains that last only a few seconds)  Answer Assessment - Initial Assessment Questions 1. LOCATION: "Where does it hurt?"       Center and back 2. RADIATION: "Does the pain go anywhere else?" (e.g., into neck, jaw, arms, back)    back 3. ONSET: "When did the chest pain begin?" (Minutes, hours or days)     Tuesday night 4. PATTERN "Does the pain come and go, or has it been constant since it started?"  "Does it get worse with exertion?"     Comes and goes 5. DURATION: "How long does it last" (e.g., seconds, minutes, hours)    2-3 hours very short in beginnimg 6. SEVERITY: "How bad is the pain?"  (e.g., Scale 1-10; mild, moderate, or severe)    - MILD (1-3): doesn't interfere with normal activities     - MODERATE (4-7): interferes with normal activities or awakens from sleep    - SEVERE (8-10): excruciating pain, unable to do any normal activities      Something not right per patient 7. CARDIAC RISK FACTORS: "Do you have any history of heart problems or risk factors for heart disease?" (e.g., angina, prior heart attack; diabetes, high blood pressure, high cholesterol, smoker, or strong family history of heart  disease)   No but has cardioligist 8. PULMONARY RISK FACTORS: "Do you have any history of lung disease?"  (e.g., blood clots in lung, asthma, emphysema, birth control pills)    no 9. CAUSE: "What do you think is causing the chest pain?"    unsure 10. OTHER SYMPTOMS: "Do you have any other symptoms?" (e.g., dizziness, nausea, vomiting, sweating, fever, difficulty breathing, cough)    Tired weak SOB 11. PREGNANCY: "Is there any chance you are pregnant?" "When was your last menstrual period?"     N/A  Protocols used: CHEST PAIN-A-AH

## 2020-08-24 NOTE — Telephone Encounter (Signed)
Pt admitted to ED.

## 2020-08-24 NOTE — ED Notes (Signed)
Patient is being discharged from the Urgent Care and sent to the Emergency Department via private vehicle. Per Dahlia Byes, APP, patient is in need of higher level of care due to chest pain, fatigue, r/o MI or other causes. Patient is aware and verbalizes understanding of plan of care.  Vitals:   08/24/20 1235  BP: 132/73  Pulse: (!) 58  Resp: 18  Temp: 98.6 F (37 C)  SpO2: 97%

## 2020-08-24 NOTE — ED Notes (Addendum)
Pt leaving ED because her daughter does not want to wait for her to be seen. Advised pt's that we could not guarantee that pt was stable enough to leave yet due to not having any results yet. Pt's daughter became aggressive with staff because she did not want to wait. Pt's daughter was advised of current wait time and advised that if her mother's results indicated critical values, we would immediately address same. Pt's daughter stated that she has a virtual appointment on Monday morning and that her mother would wait until then to find out results from the test. Pt and pt's daughter left the ED.

## 2020-08-24 NOTE — Telephone Encounter (Signed)
Needs to be evaluated in the ED for chest pain if no available appt.

## 2020-08-27 ENCOUNTER — Telehealth (INDEPENDENT_AMBULATORY_CARE_PROVIDER_SITE_OTHER): Payer: Medicare Other | Admitting: Cardiology

## 2020-08-27 ENCOUNTER — Encounter: Payer: Self-pay | Admitting: *Deleted

## 2020-08-27 ENCOUNTER — Telehealth: Payer: Self-pay | Admitting: *Deleted

## 2020-08-27 ENCOUNTER — Other Ambulatory Visit: Payer: Self-pay

## 2020-08-27 ENCOUNTER — Encounter: Payer: Self-pay | Admitting: Cardiology

## 2020-08-27 VITALS — BP 148/73 | HR 56 | Ht 61.0 in

## 2020-08-27 DIAGNOSIS — R079 Chest pain, unspecified: Secondary | ICD-10-CM | POA: Diagnosis not present

## 2020-08-27 DIAGNOSIS — R072 Precordial pain: Secondary | ICD-10-CM

## 2020-08-27 DIAGNOSIS — I10 Essential (primary) hypertension: Secondary | ICD-10-CM

## 2020-08-27 NOTE — Telephone Encounter (Signed)
  Patient Consent for Virtual Visit      {Click here.  Press F2 and choose YES or NO                  :Yes per daughter.  Pt does not speak EnlishS   CONSENT FOR VIRTUAL VISIT FOR:  Lynn [name]  By participating in this virtual visit I agree to the following:  I hereby voluntarily request, consent and authorize [facility] and its employed or contracted physicians, Producer, television/film/video, nurse practitioners or other licensed health care professionals (the Practitioner), to provide me with telemedicine health care services (the "Services") as deemed necessary by the treating Practitioner. I acknowledge and consent to receive the Services by the Practitioner via telemedicine. I understand that the telemedicine visit will involve communicating with the Practitioner through live audiovisual communication technology and the disclosure of certain medical information by electronic transmission. I acknowledge that I have been given the opportunity to request an in-person assessment or other available alternative prior to the telemedicine visit and am voluntarily participating in the telemedicine visit.  I understand that I have the right to withhold or withdraw my consent to the use of telemedicine in the course of my care at any time, without affecting my right to future care or treatment, and that the Practitioner or I may terminate the telemedicine visit at any time. I understand that I have the right to inspect all information obtained and/or recorded in the course of the telemedicine visit and may receive copies of available information for a reasonable fee.  I understand that some of the potential risks of receiving the Services via telemedicine include:  [name] Delay or interruption in medical evaluation due to technological equipment failure or disruption; . Information transmitted may not be sufficient (e.g. poor resolution of images) to allow for appropriate medical decision making by the  Practitioner; and/or  . In rare instances, security protocols could fail, causing a breach of personal health information.  Furthermore, I acknowledge that it is my responsibility to provide information about my medical history, conditions and care that is complete and accurate to the best of my ability. I acknowledge that Practitioner's advice, recommendations, and/or decision may be based on factors not within their control, such as incomplete or inaccurate data provided by me or distortions of diagnostic images or specimens that may result from electronic transmissions. I understand that the practice of medicine is not an exact science and that Practitioner makes no warranties or guarantees regarding treatment outcomes. I acknowledge that a copy of this consent can be made available to me via my patient portal [facility] MyChart), or I can request a printed copy by calling the office of [facility].    I understand that my insurance will be billed for this visit.   I have read or had this consent read to me. . I understand the contents of this consent, which adequately explains the benefits and risks of the Services being provided via telemedicine.  . I have been provided ample opportunity to ask questions regarding this consent and the Services and have had my questions answered to my satisfaction. . I give my informed consent for the services to be provided through the use of telemedicine in my medical care

## 2020-08-27 NOTE — Addendum Note (Signed)
Addended by: Sharin Grave on: 08/27/2020 10:14 AM   Modules accepted: Orders

## 2020-08-27 NOTE — Addendum Note (Signed)
Addended by: Sharin Grave on: 08/27/2020 11:12 AM   Modules accepted: Orders

## 2020-08-27 NOTE — Patient Instructions (Signed)
Medication Instructions:  The current medical regimen is effective;  continue present plan and medications.  *If you need a refill on your cardiac medications before your next appointment, please call your pharmacy*  Testing/Procedures:Your physician has requested that you have a lexiscan myoview. For further information please visit [email or web address] Please follow instruction sheet, as given.  Follow-Up: At [facility], you and your health needs are our priority.  As part of our continuing mission to provide you with exceptional heart care, we have created designated Provider Care Teams.  These Care Teams include your primary Cardiologist (physician) and Advanced Practice Providers (APPs -  Physician Assistants and Nurse Practitioners) who all work together to provide you with the care you need, when you need it.  We recommend signing up for the patient portal called "MyChart".  Sign up information is provided on this After Visit Summary.  MyChart is used to connect with patients for Virtual Visits (Telemedicine).  Patients are able to view lab/test results, encounter notes, upcoming appointments, etc.  Non-urgent messages can be sent to your provider as well.   To learn more about what you can do with MyChart, go to [email or web address].    Your next appointment:   Further follow up will be determined based on the results of the above testing.  Other Instructions Thank you for choosing [facility]!!

## 2020-08-27 NOTE — Progress Notes (Signed)
Virtual Visit via Telephone Note   This visit type was conducted due to national recommendations for restrictions regarding the COVID-19 Pandemic (e.g. social distancing) in an effort to limit this patient's exposure and mitigate transmission in our community.  Due to her co-morbid illnesses, this patient is at least at moderate risk for complications without adequate follow up.  This format is felt to be most appropriate for this patient at this time.  The patient did not have access to video technology/had technical difficulties with video requiring transitioning to audio format only (telephone).  All issues noted in this document were discussed and addressed.  No physical exam could be performed with this format.  Please refer to the patient's chart for her  consent to telehealth for [facility].    Date:  08/27/2020   ID:  Lynn [name], Lynn [name] 01-Sep-1941, MRN [id number] The patient was identified using 2 identifiers.  Patient Location: Home Provider Location: Home Office  PCP:  Patient, No Pcp Per  Cardiologist:  Donato Schultz, MD  Electrophysiologist:  None   Evaluation Performed:  New Patient Evaluation  Chief Complaint: Chest pain  History of Present Illness:    Lynn [name] is a 79 y.o. female with here for the evaluation of chest pain.  Was in the emergency department with chest discomfort, back pain, felt poor, fatigue.  This was going on for up to 3 days.  She has been feeling tired and lethargic at times.  Blood pressure has been labile.  She [place] an as needed captopril [place] when this would happen rarely may be 3 times per year.  Sometimes she may feel a headache surrounding elevated blood pressure.  Her chest pressure/pain has resolved since the ER visit.    Notes reviewed.  Lab work reviewed troponin was normal.  EKG personally reviewed and interpreted shows sinus rhythm with no other abnormalities.    Overall she feels as though her mom  just does not look right.  On her medication list she does have gabapentin and hydroxyzine listed however she has not taken these.   The patient does not have symptoms concerning for COVID-19 infection (fever, chills, cough, or new shortness of breath).    Past Medical History:  Diagnosis Date  . Bradycardia 03/01/2017  . Edema 03/01/2017  . 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 Hyperlipidemia 03/01/2017  . Hypertension   . Memory loss   . 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   Current Meds  Medication Sig  . amLODipine (NORVASC) 2.5 MG tablet Take 1 tablet (2.5 mg total) by mouth daily.  [name] aspirin EC 81 MG tablet Take 81 mg by mouth every other day. Swallow whole.  [name] atorvastatin (LIPITOR) 10 MG tablet TAKE 1 TABLET (10 MG TOTAL) BY MOUTH DAILY AT 6 PM.  . atorvastatin (LIPITOR) 20 MG tablet Take 20 mg by mouth daily.  . calcium carbonate (OS-CAL - DOSED IN MG OF ELEMENTAL CALCIUM) 1250 (500 Ca) MG tablet Take 1 tablet by mouth 3 (three) times a week.  . diclofenac Sodium (VOLTAREN) 1 % GEL Apply 4 g topically 4 (four) times daily as needed.  . hydrochlorothiazide (HYDRODIURIL) 25 MG tablet Take 25 mg by mouth daily.  [name] ibuprofen (ADVIL,MOTRIN) 200 MG tablet Take 200 mg by mouth every 6 (six) hours as needed.  [name] losartan (COZAAR) 50 MG tablet Take 1 tablet (50 mg total) by mouth 2 (two) times daily. To lower blood  pressure  . metoprolol tartrate (LOPRESSOR) 25 MG tablet Take 0.5 tablets (12.5 mg total) by mouth 2 (two) times daily. To lower blood pressure  . Multiple Vitamin (MULTIVITAMIN) tablet Take 1 tablet by mouth 2 (two) times a week.  . Pyridoxine HCl (VITAMIN B-6 PO) Take by mouth.  . Thiamine HCl (VITAMIN B-1 PO) Take by mouth.     Allergies:   Patient has no known allergies.   Social History   Tobacco Use  . Smoking status: Never Smoker  . Smokeless tobacco: Never Used  Vaping Use  . Vaping Use: Never used  Substance Use Topics  .  Alcohol use: No  . Drug use: No     Family Hx: The patient's family history includes Diverticulitis in her mother; Hypertension in her sister; Other in her father.  ROS:   Please see the history of present illness.    No syncope no fevers chills nausea vomiting.  She has had some weight loss recently.  Reviewed Dr. Debroah Baller note All other systems reviewed and are negative.   Prior CV studies:   The following studies were reviewed today:  As above.  Labs/Other Tests and Data Reviewed:    EKG:  Sinus rhythm no ischemic changes from ER  Recent Labs: 07/19/2020: ALT 15; TSH 3.650 08/24/2020: BUN 16; Creatinine, Ser 0.84; Hemoglobin 12.8; Platelets 263; Potassium 3.6; Sodium 132   Recent Lipid Panel Lab Results  Component Value Date/Time   CHOL 119 12/02/2019 09:22 AM   TRIG 78 12/02/2019 09:22 AM   HDL 50 12/02/2019 09:22 AM   CHOLHDL 2.4 12/02/2019 09:22 AM   CHOLHDL 2.3 02/25/2017 10:03 AM   LDLCALC 53 12/02/2019 09:22 AM    Wt Readings from Last 3 Encounters:  08/24/20 120 lb (54.4 kg)  07/19/20 120 lb (54.4 kg)  11/18/19 122 lb (55.3 kg)     Risk Assessment/Calculations:      Objective:    Vital Signs:  BP (!) 148/73   Pulse (!) 56   Ht 5\' 1"  (1.549 m)   BMI 22.67 kg/m    VITAL SIGNS:  reviewed  ASSESSMENT & PLAN:    Chest pain -We will order a nuclear stress test Lexiscan. -Troponin reassuring normal.  Explained to daughter other potential causes for her pain including GI or musculoskeletal.  She does have a visit not too long ago with orthopedics for back pain.  Essential hypertension -Occasional high blood pressures noted.  She was asking for as needed captopril.  I did instruct her that she does have losartan currently prescribed by Dr. .  I agree with Dr. Jillyn Hidden hypertension plan.  An occasional high blood pressure should not be cause for alarm.  Anxieties as well as pain can create higher than normal blood pressures.  They do return to normal in her  situation.  Fatigue -Note, she has not taking gabapentin or or hydroxyzine.  Multifactorial.  We will follow-up with results of stress test.  Shared Decision Making/Informed Consent    The risks [chest pain, shortness of breath, cardiac arrhythmias, dizziness, blood pressure fluctuations, myocardial infarction, stroke/transient ischemic attack, nausea, vomiting, allergic reaction, radiation exposure, metallic taste sensation and life-threatening complications (estimated to be 1 in 10,000)], benefits (risk stratification, diagnosing coronary artery disease, treatment guidance) and alternatives of a nuclear stress test were discussed in detail with Ms. Shafiei Roudbari and she agrees to proceed.       COVID-19 Education: The signs and symptoms of COVID-19 were discussed with the patient and  how to seek care for testing (follow up with PCP or arrange E-visit).  The importance of social distancing was discussed today.  Time:   Today, I have spent 21 minutes with the patient with telehealth technology discussing the above problems.     Medication Adjustments/Labs and Tests Ordered: Current medicines are reviewed at length with the patient today.  Concerns regarding medicines are outlined above.   Tests Ordered: Orders Placed This Encounter  Procedures  . NM Myocar Multi W/Spect W/Wall Motion / EF    Medication Changes: No orders of the defined types were placed in this encounter.   Follow Up:  As needed   Signed, Donato Schultz, MD  08/27/2020 8:48 AM    [facility]

## 2020-09-03 ENCOUNTER — Other Ambulatory Visit: Payer: Self-pay

## 2020-09-03 ENCOUNTER — Telehealth (HOSPITAL_COMMUNITY): Payer: Self-pay

## 2020-09-03 DIAGNOSIS — R072 Precordial pain: Secondary | ICD-10-CM

## 2020-09-03 NOTE — Progress Notes (Signed)
Please sign attestation order for myoview. Thanks 

## 2020-09-03 NOTE — Telephone Encounter (Signed)
Spoke with the patient's daughter, she stated that she understood and would be here for her test. Asked to call back with any questions. S.Anarie Kalish EMTP

## 2020-09-05 NOTE — Addendum Note (Signed)
Addended by: Jake Bathe on: 09/05/2020 06:51 AM   Modules accepted: Orders

## 2020-09-06 ENCOUNTER — Ambulatory Visit (HOSPITAL_COMMUNITY): Payer: Medicare Other | Attending: Cardiovascular Disease

## 2020-09-06 ENCOUNTER — Other Ambulatory Visit: Payer: Self-pay

## 2020-09-06 VITALS — Ht 61.0 in | Wt 120.0 lb

## 2020-09-06 DIAGNOSIS — R072 Precordial pain: Secondary | ICD-10-CM

## 2020-09-06 DIAGNOSIS — R110 Nausea: Secondary | ICD-10-CM | POA: Insufficient documentation

## 2020-09-06 LAB — MYOCARDIAL PERFUSION IMAGING
LV dias vol: 47 mL (ref 46–106)
LV sys vol: 10 mL
Peak HR: 96 {beats}/min
Rest HR: 56 {beats}/min
SDS: 1
SRS: 0
SSS: 1
TID: 1.04

## 2020-09-06 MED ORDER — TECHNETIUM TC 99M TETROFOSMIN IV KIT
9.8000 | PACK | Freq: Once | INTRAVENOUS | Status: AC | PRN
  Administered 2020-09-06: 9.8 via INTRAVENOUS
  Filled 2020-09-06: qty 10

## 2020-09-06 MED ORDER — TECHNETIUM TC 99M TETROFOSMIN IV KIT
31.9000 | PACK | Freq: Once | INTRAVENOUS | Status: AC | PRN
  Administered 2020-09-06: 31.9 via INTRAVENOUS
  Filled 2020-09-06: qty 32

## 2020-09-06 MED ORDER — AMINOPHYLLINE 25 MG/ML IV SOLN
75.0000 mg | Freq: Once | INTRAVENOUS | Status: AC
  Administered 2020-09-06: 75 mg via INTRAVENOUS

## 2020-09-06 MED ORDER — REGADENOSON 0.4 MG/5ML IV SOLN
0.4000 mg | Freq: Once | INTRAVENOUS | Status: AC
  Administered 2020-09-06: 12:00:00 0.4 mg via INTRAVENOUS

## 2020-09-27 ENCOUNTER — Ambulatory Visit: Payer: Medicare Other | Admitting: Cardiology

## 2020-10-15 ENCOUNTER — Ambulatory Visit (INDEPENDENT_AMBULATORY_CARE_PROVIDER_SITE_OTHER): Payer: Medicare Other

## 2020-10-15 ENCOUNTER — Ambulatory Visit (INDEPENDENT_AMBULATORY_CARE_PROVIDER_SITE_OTHER): Payer: Medicare Other | Admitting: Orthopaedic Surgery

## 2020-10-15 DIAGNOSIS — M25511 Pain in right shoulder: Secondary | ICD-10-CM | POA: Diagnosis not present

## 2020-10-15 MED ORDER — METHYLPREDNISOLONE ACETATE 40 MG/ML IJ SUSP
40.0000 mg | INTRAMUSCULAR | Status: AC | PRN
  Administered 2020-10-15: 40 mg via INTRA_ARTICULAR

## 2020-10-15 MED ORDER — LIDOCAINE HCL 1 % IJ SOLN
3.0000 mL | INTRAMUSCULAR | Status: AC | PRN
  Administered 2020-10-15: 3 mL

## 2020-10-15 NOTE — Progress Notes (Signed)
Office Visit Note   Patient: Lynn [name]           Date of Birth: 1941/01/17           MRN: [id number] Visit Date: 10/15/2020              Requested by: No referring provider defined for this encounter. PCP: Patient, No Pcp Per   Assessment & Plan: Visit Diagnoses:  1. Acute pain of right shoulder     Plan: Since it has been 4 weeks since her COVID-19 injection, I have recommended a steroid injection in the right shoulder subacromial outlet to hopefully calm down the inflammation that she has of the subacromial outlet.  She and her daughter agree with this treatment plan.  I explained the risk and benefits of injections.  She did tolerate it well.  All questions and concerns were answered and addressed.  Follow-up can be as needed.  Follow-Up Instructions: Return if symptoms worsen or fail to improve.   Orders:  Orders Placed This Encounter  Procedures  . Large Joint Inj  . XR Shoulder Right   No orders of the defined types were placed in this encounter.     Procedures: Large Joint Inj: R subacromial bursa on 10/15/2020 11:01 AM Indications: pain and diagnostic evaluation Details: 22 G 1.5 in needle  Arthrogram: No  Medications: 3 mL lidocaine 1 %; 40 mg methylPREDNISolone acetate 40 MG/ML Outcome: tolerated well, no immediate complications Procedure, treatment alternatives, risks and benefits explained, specific risks discussed. Consent was given by the patient. Immediately prior to procedure a time out was called to verify the correct patient, procedure, equipment, support staff and site/side marked as required. Patient was prepped and draped in the usual sterile fashion.       Clinical Data: No additional findings.   Subjective: Chief Complaint  Patient presents with  . Right Shoulder - Pain  The patient comes in today with chief complaint of right shoulder pain.  She is non-English-speaking so her daughter is translating for her.  She denies any  previous shoulder problems.  4 weeks ago she did have a COVID-19 injection in the muscles around the right shoulder and she has been hurting with overhead activities and reaching behind her since then.  She denies any previous injuries to the right shoulder.  She is not a diabetic.  Through her daughter she states that it has been sore sleeping on her shoulder at night as well.  This is the right side.  She has had no other acute change in medical status.  HPI  Review of Systems There is currently no listed headache, chest pain, shortness of breath, fever, chills, nausea, vomiting  Objective: Vital Signs: There were no vitals taken for this visit.  Physical Exam She is alert and oriented x3 and in no acute distress.  She follows commands appropriately. Ortho Exam Examination of her right shoulder shows it is well located.  There is no redness around the skin of the muscles of the right shoulder on inspection.  She can abduct and externally rotate the shoulder and reach behind her easily.  It does show some signs of impingement with pain in the subacromial outlet. Specialty Comments:  No specialty comments available.  Imaging: XR Shoulder Right  Result Date: 10/15/2020 3 views of the right shoulder show well located shoulder with no complicating features.  The bone is osteopenic.    PMFS History: Patient Active Problem List   Diagnosis Date Noted  .  Hyponatremia 11/19/2019  . 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 Prediabetes 11/19/2019  . Hypertension 08/03/2019  . 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 Bradycardia 03/01/2017  . 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 Hyperlipidemia 03/01/2017  . Edema 03/01/2017   Past Medical History:  Diagnosis Date  . Bradycardia 03/01/2017  . Edema 03/01/2017  . 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 Hyperlipidemia 03/01/2017  . Hypertension   . Memory loss   . Mild cognitive disorder 06/14/2018    Family History   Problem Relation Age of Onset  . Diverticulitis Mother   . Other Father        unsure  . Hypertension Sister     No past surgical history on file. Social History   Occupational History  . Occupation: Retired  Tobacco Use  . Smoking status: Never Smoker  . Smokeless tobacco: Never Used  Vaping Use  . Vaping Use: Never used  Substance and Sexual Activity  . Alcohol use: No  . Drug use: No  . Sexual activity: Not Currently

## 2020-10-19 ENCOUNTER — Ambulatory Visit: Payer: Medicare Other | Admitting: Family Medicine

## 2020-11-12 ENCOUNTER — Other Ambulatory Visit: Payer: Self-pay | Admitting: Family Medicine

## 2020-11-12 DIAGNOSIS — I10 Essential (primary) hypertension: Secondary | ICD-10-CM

## 2020-11-12 NOTE — Telephone Encounter (Signed)
Medication Refill - Medication: metoprolol tartrate (LOPRESSOR) 25 MG tablet  Take 2 pills 1 in the am and 1 in the pm. Requesting a courtesy refill to hold her over until 01/09/2021 next appt.    Has the patient contacted their pharmacy? Yes.    (Agent: If yes, when and what did the pharmacy advise?) to contact PCP office due to patient having 1 pill left and taking more then prescribed.   Preferred Pharmacy (with phone number or street name):   [id number] [place], [place] - [address] Phone:  [phone]  Fax:  [phone]      Agent: Please be advised that RX refills may take up to 3 business days. We ask that you follow-up with your pharmacy.

## 2020-11-12 NOTE — Telephone Encounter (Signed)
Requested medication (s) are due for refill today - no- not as directed  Requested medication (s) are on the active medication list -yes  Future visit scheduled -no  Last refill: 07/19/20 #180 1RF  Notes to clinic: Call to patient's daughter- advised unable to change #or directions on medication- she states she did not realize that her other was only directed to take 1/2 tablet bid- and she has been dosing her 1 tablet bid. She states the cardiologist is aware and told her this was ok. Advised without appointment- Rx can not be changed- provider no longer here in office. Will send message to office for review.Daughter ask for call back- her mother only has 1 pill.  Requested Prescriptions  Pending Prescriptions Disp Refills   metoprolol tartrate (LOPRESSOR) 25 MG tablet 180 tablet 1    Sig: Take 0.5 tablets (12.5 mg total) by mouth 2 (two) times daily. To lower blood pressure      Cardiovascular:  Beta Blockers Failed - 11/12/2020 11:36 AM      Failed - Last BP in normal range    BP Readings from Last 1 Encounters:  08/27/20 (!) 148/73          Passed - Last Heart Rate in normal range    Pulse Readings from Last 1 Encounters:  08/27/20 (!) 56          Passed - Valid encounter within last 6 months    Recent Outpatient Visits           3 months ago Need for immunization against influenza   [facility] [place], [place], MD   12 months ago Essential hypertension   [facility] [place], [place], MD   1 year ago Weight loss, non-intentional   [facility] [place] Matar, MD   1 year ago Essential hypertension   [facility] [place], [place], MD   2 years ago Essential hypertension   [facility] [place], [place], [place]       Future Appointments             In 1 month Jimmey Ralph, Katina Degree, MD [facility] [place], [facility]                  Requested Prescriptions  Pending Prescriptions Disp Refills   metoprolol tartrate (LOPRESSOR) 25 MG tablet 180 tablet 1    Sig: Take 0.5 tablets (12.5 mg total) by mouth 2 (two) times daily. To lower blood pressure      Cardiovascular:  Beta Blockers Failed - 11/12/2020 11:36 AM      Failed - Last BP in normal range    BP Readings from Last 1 Encounters:  08/27/20 (!) 148/73          Passed - Last Heart Rate in normal range    Pulse Readings from Last 1 Encounters:  08/27/20 (!) 56          Passed - Valid encounter within last 6 months    Recent Outpatient Visits           3 months ago Need for immunization against influenza   [facility] [place], [place], MD   12 months ago Essential hypertension   [facility] [place], [place], MD   1 year ago Weight loss, non-intentional   [facility] [place],  [place]   1 year ago Essential hypertension   [facility] [place], [place], MD   2 years ago Essential hypertension   [facility] [place], Marzella [place], [place]       Future Appointments             In 1 month Jimmey Ralph, Katina Degree, MD [facility] [place], [place]

## 2020-12-10 ENCOUNTER — Other Ambulatory Visit: Payer: Self-pay | Admitting: Family Medicine

## 2020-12-10 DIAGNOSIS — Z0000 Encounter for general adult medical examination without abnormal findings: Secondary | ICD-10-CM

## 2020-12-24 ENCOUNTER — Other Ambulatory Visit: Payer: Self-pay

## 2020-12-24 ENCOUNTER — Ambulatory Visit: Payer: Medicare Other | Admitting: Nurse Practitioner

## 2021-01-09 ENCOUNTER — Encounter: Payer: Self-pay | Admitting: Family Medicine

## 2021-01-09 ENCOUNTER — Ambulatory Visit (INDEPENDENT_AMBULATORY_CARE_PROVIDER_SITE_OTHER): Payer: Medicare Other | Admitting: Family Medicine

## 2021-01-09 ENCOUNTER — Other Ambulatory Visit: Payer: Self-pay

## 2021-01-09 VITALS — BP 152/81 | HR 59 | Temp 98.3°F | Ht 61.0 in | Wt 118.2 lb

## 2021-01-09 DIAGNOSIS — E049 Nontoxic goiter, unspecified: Secondary | ICD-10-CM

## 2021-01-09 DIAGNOSIS — R7303 Prediabetes: Secondary | ICD-10-CM

## 2021-01-09 DIAGNOSIS — F0390 Unspecified dementia without behavioral disturbance: Secondary | ICD-10-CM

## 2021-01-09 DIAGNOSIS — I10 Essential (primary) hypertension: Secondary | ICD-10-CM

## 2021-01-09 DIAGNOSIS — M1990 Unspecified osteoarthritis, unspecified site: Secondary | ICD-10-CM

## 2021-01-09 DIAGNOSIS — F03B0 Unspecified dementia, moderate, without behavioral disturbance, psychotic disturbance, mood disturbance, and anxiety: Secondary | ICD-10-CM

## 2021-01-09 DIAGNOSIS — E785 Hyperlipidemia, unspecified: Secondary | ICD-10-CM

## 2021-01-09 LAB — CBC
HCT: 37.4 % (ref 36.0–46.0)
Hemoglobin: 12.4 g/dL (ref 12.0–15.0)
MCHC: 33.3 g/dL (ref 30.0–36.0)
MCV: 88.3 fl (ref 78.0–100.0)
Platelets: 257 10*3/uL (ref 150.0–400.0)
RBC: 4.23 Mil/uL (ref 3.87–5.11)
RDW: 13.5 % (ref 11.5–15.5)
WBC: 6 10*3/uL (ref 4.0–10.5)

## 2021-01-09 LAB — COMPREHENSIVE METABOLIC PANEL
ALT: 18 U/L (ref 0–35)
AST: 24 U/L (ref 0–37)
Albumin: 3.8 g/dL (ref 3.5–5.2)
Alkaline Phosphatase: 57 U/L (ref 39–117)
BUN: 20 mg/dL (ref 6–23)
CO2: 30 mEq/L (ref 19–32)
Calcium: 9.3 mg/dL (ref 8.4–10.5)
Chloride: 98 mEq/L (ref 96–112)
Creatinine, Ser: 0.88 mg/dL (ref 0.40–1.20)
GFR: 62.18 mL/min (ref >60.00)
Glucose, Bld: 91 mg/dL (ref 70–99)
Potassium: 3.8 mEq/L (ref 3.5–5.1)
Sodium: 135 mEq/L (ref 135–145)
Total Bilirubin: 0.7 mg/dL (ref 0.2–1.2)
Total Protein: 7 g/dL (ref 6.0–8.3)

## 2021-01-09 LAB — LIPID PANEL
Cholesterol: 138 mg/dL (ref 0–200)
HDL: 50.3 mg/dL (ref >39.00)
LDL Cholesterol: 73 mg/dL (ref 0–99)
NonHDL: 88.03
Total CHOL/HDL Ratio: 3
Triglycerides: 76 mg/dL (ref 0.0–149.0)
VLDL: 15.2 mg/dL (ref 0.0–40.0)

## 2021-01-09 LAB — HEMOGLOBIN A1C: Hgb A1c MFr Bld: 6.2 % (ref 4.6–6.5)

## 2021-01-09 LAB — TSH: TSH: 3.36 u[IU]/mL (ref 0.35–4.50)

## 2021-01-09 MED ORDER — HYDROCHLOROTHIAZIDE 25 MG PO TABS: 25.0000 mg | ORAL_TABLET | Freq: Every day | ORAL | 3 refills | Status: DC

## 2021-01-09 MED ORDER — LOSARTAN POTASSIUM 50 MG PO TABS: 50.0000 mg | ORAL_TABLET | Freq: Two times a day (BID) | ORAL | 3 refills | Status: DC

## 2021-01-09 MED ORDER — METOPROLOL TARTRATE 25 MG PO TABS: 25.0000 mg | ORAL_TABLET | Freq: Two times a day (BID) | ORAL | 3 refills | Status: DC

## 2021-01-09 MED ORDER — ATORVASTATIN CALCIUM 10 MG PO TABS: 10.0000 mg | ORAL_TABLET | Freq: Every day | ORAL | 3 refills | Status: DC

## 2021-01-09 NOTE — Patient Instructions (Signed)
It was very nice to see you today!  We will check blood work today.  I will refill your medications today.  Please continue to work on diet and exercise.  You are due for your pneumonia vaccines.  We can discuss this at your next office visit.  I will see you back in 3 months.  Please come back to see me sooner if needed.  Take care, Dr Jimmey Ralph  PLEASE NOTE:  If you had any lab tests please let us know if you have not heard back within a few days. You may see your results on mychart before we have a chance to review them but we will give you a call once they are reviewed by [place]. If we ordered any referrals today, please let us know if you have not heard from their office within the next week.   Please try these tips to maintain a healthy lifestyle:   Eat at least 3 REAL meals and 1-2 snacks per day.  Aim for no more than 5 hours between eating.  If you eat breakfast, please do so within one hour of getting up.    Each meal should contain half fruits/vegetables, one quarter protein, and one quarter carbs (no bigger than a computer mouse)   Cut down on sweet beverages. This includes juice, soda, and sweet tea.     Drink at least 1 glass of water with each meal and aim for at least 8 glasses per day   Exercise at least 150 minutes every week.

## 2021-01-09 NOTE — Assessment & Plan Note (Signed)
She is on Lipitor 10 mg daily.  We will refill this today.  She is tolerating well.  Check levels today.

## 2021-01-09 NOTE — Progress Notes (Signed)
Lynn [name] is a 80 y.o. female who presents today for an office visit.  She is a new patient. History provided by patient and patient's daughter who is acting as an Engineer, technical sales as well.   Assessment/Plan:  Chronic Problems Addressed Today: Osteoarthritis Managed by orthopedics.  Occasionally gets steroid injections which works well.  Takes Tylenol and uses Voltaren gel.  Symptoms are currently managed well.  Prediabetes Not currently on any medications.  We will check A1c today.  She is doing a good job with avoiding carbs and sweets.  Activity is limited due to osteoarthritis.  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  Has seen urology in the past.  Family does not want to start prescription medications at this point.  Encouraged patient to keep mind active with daily reading, puzzles, and social interaction.  Hypertension Slightly above goal today.  We will refill her medications.  She is on HCTZ 25 mg daily, losartan 50 mg twice daily and metoprolol tartrate 25 mg twice daily.  She is tolerating these well.  Check labs today.  Hyperlipidemia She is on Lipitor 10 mg daily.  We will refill this today.  She is tolerating well.  Check levels today.  Enlarged thyroid gland Check TSH.  She was told by a physician overseas that she should have her thyroid checked yearly.  Preventative health care Will be getting mammogram done soon.  She is up-to-date on COVID-vaccine.  No longer needs for screening due to age.  Recommended pneumonia vaccine-family will look into this.  Follow-up 3 months.    Subjective:  HPI:  See A/P for status of chronic medical conditions.  She is doing well today.  No acute complaints.  Needs refills would like to have blood work done.  Will be getting mammogram done in a couple of weeks.  PMH:  The following were reviewed and entered/updated in epic: Past Medical History:  Diagnosis Date  . Arthritis   .  Bradycardia 03/01/2017  . Edema 03/01/2017  . 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 Hyperlipidemia 03/01/2017  . Hypertension   . Memory loss   . Mild cognitive disorder 06/14/2018   Patient Active Problem List   Diagnosis Date Noted  . Osteoarthritis 01/09/2021  . 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 Prediabetes 11/19/2019  . Hypertension 08/03/2019  . 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 Hyperlipidemia 03/01/2017   No past surgical history on file.  Family History  Problem Relation Age of Onset  . Diverticulitis Mother   . Other Father        unsure  . Hypertension Sister     Medications- reviewed and updated Current Outpatient Medications  Medication Sig Dispense Refill  . amLODipine (NORVASC) 2.5 MG tablet Take 1 tablet (2.5 mg total) by mouth daily. 90 tablet 1  . aspirin EC 81 MG tablet Take 81 mg by mouth every other day. Swallow whole.    . calcium carbonate (OS-CAL - DOSED IN MG OF ELEMENTAL CALCIUM) 1250 (500 Ca) MG tablet Take 1 tablet by mouth 3 (three) times a week.    . diclofenac Sodium (VOLTAREN) 1 % GEL Apply 4 g topically 4 (four) times daily as needed. 500 g 2  . ibuprofen (ADVIL,MOTRIN) 200 MG tablet Take 200 mg by mouth every 6 (six) hours as needed.    . Multiple Vitamin (MULTIVITAMIN) tablet Take 1 tablet by mouth 2 (two) times a week.    . Pyridoxine HCl (VITAMIN B-6 PO)  Take by mouth.    . Thiamine HCl (VITAMIN B-1 PO) Take by mouth.    [name] atorvastatin (LIPITOR) 10 MG tablet Take 1 tablet (10 mg total) by mouth daily at 6 PM. 90 tablet 3  . hydrochlorothiazide (HYDRODIURIL) 25 MG tablet Take 1 tablet (25 mg total) by mouth daily. 90 tablet 3  . losartan (COZAAR) 50 MG tablet Take 1 tablet (50 mg total) by mouth 2 (two) times daily. 180 tablet 3  . metoprolol tartrate (LOPRESSOR) 25 MG tablet Take 1 tablet (25 mg total) by mouth 2 (two) times daily. 180 tablet 3   No current facility-administered medications for this visit.     Allergies-reviewed and updated No Known Allergies  Social History   Socioeconomic History  . Marital status: Widowed    Spouse name: Not on file  . Number of children: 2  . Years of education: some college  . Highest education level: Not on file  Occupational History  . Occupation: Retired  Tobacco Use  . Smoking status: Never Smoker  . Smokeless tobacco: Never Used  Vaping Use  . Vaping Use: Never used  Substance and Sexual Activity  . Alcohol use: No  . Drug use: No  . Sexual activity: Not Currently  Other Topics Concern  . 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; clerical work for office   Social Determinants of Corporate investment banker Strain: Not on Ship broker Insecurity: Not on file  Transportation Needs: Not on file  Physical Activity: Not on file  Stress: Not on file  Social Connections: Not on file        Objective:  Physical Exam: BP (!) 152/81   Pulse (!) 59   Temp 98.3 F (36.8 C)   Ht 5\' 1"  (1.549 m)   Wt 118 lb 3.2 oz (53.6 kg)   SpO2 98%   BMI 22.33 kg/m   Gen: No acute distress, resting comfortably CV: Regular rate and rhythm with no murmurs appreciated Pulm: Normal work of breathing, clear to auscultation bilaterally with no crackles, wheezes, or rhonchi Neuro: Grossly normal, moves all extremities Psych: Normal affect and thought content  Time Spent: 68 minutes of total time was spent on the date of the encounter performing the following actions: chart review prior to seeing the patient including visits with her previous PCP and visits with recent specialists, obtaining history, performing a medically necessary exam, counseling on the treatment plan, placing orders, and documenting in our EHR.        . Katina Degree,  MD 01/09/2021 10:58 AM

## 2021-01-09 NOTE — Assessment & Plan Note (Signed)
Check TSH.  She was told by a physician overseas that she should have her thyroid checked yearly.

## 2021-01-09 NOTE — Assessment & Plan Note (Signed)
Slightly above goal today.  We will refill her medications.  She is on HCTZ 25 mg daily, losartan 50 mg twice daily and metoprolol tartrate 25 mg twice daily.  She is tolerating these well.  Check labs today.

## 2021-01-09 NOTE — Assessment & Plan Note (Signed)
Symptoms are stable.  Has seen urology in the past.  Family does not want to start prescription medications at this point.  Encouraged patient to keep mind active with daily reading, puzzles, and social interaction.

## 2021-01-09 NOTE — Assessment & Plan Note (Signed)
Managed by orthopedics.  Occasionally gets steroid injections which works well.  Takes Tylenol and uses Voltaren gel.  Symptoms are currently managed well.

## 2021-01-09 NOTE — Assessment & Plan Note (Signed)
Not currently on any medications.  We will check A1c today.  She is doing a good job with avoiding carbs and sweets.  Activity is limited due to osteoarthritis.

## 2021-01-10 NOTE — Progress Notes (Signed)
Please inform patient of the following:  Her A1c is slightly elevated compared to last time at 6.2 but everything else is normal.  Do not need to make any changes to medications at this time.  We can recheck when she comes back in 3 months.

## 2021-01-11 ENCOUNTER — Telehealth: Payer: Self-pay

## 2021-01-11 NOTE — Telephone Encounter (Signed)
Lm for pt tcb. 

## 2021-01-11 NOTE — Telephone Encounter (Signed)
Patient is calling back regarding lab results  

## 2021-01-31 ENCOUNTER — Ambulatory Visit: Payer: Medicare Other

## 2021-01-31 ENCOUNTER — Ambulatory Visit
Admission: RE | Admit: 2021-01-31 | Discharge: 2021-01-31 | Disposition: A | Discharge status: Home / Self Care | Payer: Medicare Other | Source: Ambulatory Visit | Attending: Family Medicine | Admitting: Family Medicine

## 2021-01-31 ENCOUNTER — Other Ambulatory Visit: Payer: Self-pay

## 2021-01-31 DIAGNOSIS — Z0000 Encounter for general adult medical examination without abnormal findings: Secondary | ICD-10-CM

## 2021-02-01 ENCOUNTER — Ambulatory Visit (INDEPENDENT_AMBULATORY_CARE_PROVIDER_SITE_OTHER): Payer: Medicare Other | Admitting: Family Medicine

## 2021-02-01 DIAGNOSIS — M25561 Pain in right knee: Secondary | ICD-10-CM

## 2021-02-01 MED ORDER — CELECOXIB 100 MG PO CAPS: 100.0000 mg | ORAL_CAPSULE | Freq: Two times a day (BID) | ORAL | 3 refills | Status: DC | PRN

## 2021-02-01 NOTE — Patient Instructions (Signed)
   For knees, try these:  - Glucosamine Sulfate 1,000 mg twice daily  - Turmeric  500 mg twice daily

## 2021-02-01 NOTE — Progress Notes (Signed)
   Office Visit Note   Patient: Lynn [name]           Date of Birth: 09-20-1941           MRN: [id number] Visit Date: 02/01/2021 Requested by: Ardith Dark, MD [address] [place],  [place] [address] PCP: Ardith Dark, MD  Subjective: Chief Complaint  Patient presents with  . Right Knee - Pain    Posterior right knee pain. Difficulty going up stairs. Patient has not noticed any swelling. Pain started about 2 weeks ago.    HPI: She is here with right knee pain.  Her daughter translates for her today.  Symptoms started about 2 weeks ago, no injury.  Pain especially when trying to walk upstairs.  Pain is mostly on the posterior aspect.  She is still struggling with left knee pain but it is not as bad as the right knee right now.  Last year we gave her anti-inflammatories which did not really help.  She does not seem to respond much to cortisone injections according to her daughter.               ROS:   All other systems were reviewed and are negative.  Objective: Vital Signs: There were no vitals taken for this visit.  Physical Exam:  General:  Alert and oriented, in no acute distress. Pulm:  Breathing unlabored. Psy:  Normal mood, congruent affect. Skin: No erythema Right knee: Trace effusion with no warmth.  No popliteal cyst.  Good range of motion.  Tender on the medial joint line, pain but no palpable click with McMurray's.  Imaging: X-rays from last year were viewed on computer showing mild patellofemoral spurring.   Assessment & Plan: 1.  Right knee pain, possibly due to osteoarthritis. -We will try Celebrex, glucosamine and turmeric.  Hinged knee brace for support.  Consider getting approval for gel injections if symptoms persist.     Procedures: No procedures performed        PMFS History: Patient Active Problem List   Diagnosis Date Noted  . Osteoarthritis 01/09/2021  . 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  Prediabetes 11/19/2019  . Hypertension 08/03/2019  . 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 Hyperlipidemia 03/01/2017   Past Medical History:  Diagnosis Date  . Arthritis   . Bradycardia 03/01/2017  . Edema 03/01/2017  . 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 Hyperlipidemia 03/01/2017  . Hypertension   . Memory loss   . Mild cognitive disorder 06/14/2018    Family History  Problem Relation Age of Onset  . Diverticulitis Mother   . Other Father        unsure  . Hypertension Sister     No past surgical history on file. Social History   Occupational History  . Occupation: Retired  Tobacco Use  . Smoking status: Never Smoker  . Smokeless tobacco: Never Used  Vaping Use  . Vaping Use: Never used  Substance and Sexual Activity  . Alcohol use: No  . Drug use: No  . Sexual activity: Not Currently

## 2021-02-06 LAB — HM MAMMOGRAPHY

## 2021-02-08 ENCOUNTER — Encounter: Payer: Self-pay | Admitting: Family Medicine

## 2021-03-05 ENCOUNTER — Telehealth: Payer: Self-pay | Admitting: Family Medicine

## 2021-03-05 NOTE — Telephone Encounter (Signed)
Patient  daughter called advised patient is not any better. Charissa Bash asked if patient can get the gel injection. Charissa Bash asked for a call back as soon as possible. [phone]

## 2021-03-05 NOTE — Telephone Encounter (Signed)
Please advise 

## 2021-03-06 ENCOUNTER — Telehealth: Payer: Self-pay | Admitting: Family Medicine

## 2021-03-06 DIAGNOSIS — M1711 Unilateral primary osteoarthritis, right knee: Secondary | ICD-10-CM

## 2021-03-06 NOTE — Telephone Encounter (Signed)
I called the patient's daughter and advised her this has been ordered and the process of getting insurance approval.   April, I am sending this to you so that you know who to call with information regarding the injection/appointment (daughter, Charissa Bash, speaks [place]).

## 2021-03-06 NOTE — Telephone Encounter (Signed)
Noted  

## 2021-03-06 NOTE — Telephone Encounter (Signed)
Requesting approval for right knee gel injections for OA. 

## 2021-03-07 ENCOUNTER — Ambulatory Visit (INDEPENDENT_AMBULATORY_CARE_PROVIDER_SITE_OTHER): Payer: Medicare Other | Admitting: Family Medicine

## 2021-03-07 ENCOUNTER — Other Ambulatory Visit: Payer: Self-pay

## 2021-03-07 ENCOUNTER — Encounter: Payer: Self-pay | Admitting: Family Medicine

## 2021-03-07 VITALS — BP 153/70 | HR 59 | Temp 98.1°F | Ht 61.0 in | Wt 119.4 lb

## 2021-03-07 DIAGNOSIS — F0390 Unspecified dementia without behavioral disturbance: Secondary | ICD-10-CM

## 2021-03-07 DIAGNOSIS — I10 Essential (primary) hypertension: Secondary | ICD-10-CM | POA: Diagnosis not present

## 2021-03-07 DIAGNOSIS — F03B0 Unspecified dementia, moderate, without behavioral disturbance, psychotic disturbance, mood disturbance, and anxiety: Secondary | ICD-10-CM

## 2021-03-07 DIAGNOSIS — M1990 Unspecified osteoarthritis, unspecified site: Secondary | ICD-10-CM | POA: Diagnosis not present

## 2021-03-07 DIAGNOSIS — R630 Anorexia: Secondary | ICD-10-CM | POA: Insufficient documentation

## 2021-03-07 MED ORDER — CAPTOPRIL 25 MG PO TABS: 25.0000 mg | ORAL_TABLET | Freq: Every day | ORAL | 5 refills | Status: DC | PRN

## 2021-03-07 MED ORDER — MIRTAZAPINE 7.5 MG PO TABS: 7.5000 mg | ORAL_TABLET | Freq: Every day | ORAL | 5 refills | Status: DC

## 2021-03-07 NOTE — Assessment & Plan Note (Signed)
Slightly above goal.  We requested refill on captopril to use as needed.  Discussed that this was not a good idea to take in conjunction with losartan however stated that they will use it once a month or so and it works very well.  We will give refill today though instructed patient she should not pick at the same time as her losartan.  We will continue her other medications including Norvasc 2.5 mg daily, HCTZ 25 mg daily, and metoprolol 25 mg twice daily.

## 2021-03-07 NOTE — Patient Instructions (Signed)
It was very nice to see you today!  Please try the Remeron to help with appetite.  You can try taking a probiotic and fiber supplement as well.  Let us know if the appetite and digestion continues to be an issue.  I will send in captopril.  Please only use this as needed.  It is not a good idea for you to be on this and losartan at the same time.  I will complete the requested form for in-home assistance.  I will see back in 3 months.  Please come back to see me sooner if needed.  Take care, Dr Jimmey Ralph  PLEASE NOTE:  If you had any lab tests please let us know if you have not heard back within a few days. You may see your results on mychart before we have a chance to review them but we will give you a call once they are reviewed by [place]. If we ordered any referrals today, please let us know if you have not heard from their office within the next week.   Please try these tips to maintain a healthy lifestyle:  Eat at least 3 REAL meals and 1-2 snacks per day.  Aim for no more than 5 hours between eating.  If you eat breakfast, please do so within one hour of getting up.   Each meal should contain half fruits/vegetables, one quarter protein, and one quarter carbs (no bigger than a computer mouse)  Cut down on sweet beverages. This includes juice, soda, and sweet tea.   Drink at least 1 glass of water with each meal and aim for at least 8 glasses per day  Exercise at least 150 minutes every week.

## 2021-03-07 NOTE — Telephone Encounter (Signed)
Noted  

## 2021-03-07 NOTE — Assessment & Plan Note (Signed)
Possibly could be part of dementia.  She describes what sounds like mild dysphagia but has no regurgitation or apparent weight loss.  We discussed referral to GI however will defer for now.  Recommended probiotic and fiber supplementation.  We will start Remeron to help with her appetite.  Follow-up in 3 months.  If has progression of dysphagia symptoms will rediscuss GI referral.

## 2021-03-07 NOTE — Assessment & Plan Note (Signed)
Overall stable.  Discussed importance of regular mental activity.  Discussed that over-the-counter vitamins do not have much scientific evidence at current time.  Patient's daughter is concerned about leaving patient at home during the day.  We will complete a form for her to have home health assistant.

## 2021-03-07 NOTE — Assessment & Plan Note (Signed)
Following with orthopedics.  Will be getting hyaluronic acid injections soon.

## 2021-03-07 NOTE — Progress Notes (Signed)
Lynn [name] is a 80 y.o. female who presents today for an office visit.  Assessment/Plan:  New/Acute Problems: Cold intolerance No red flags.  Recent TSH was normal.  Reassured patient.  Discussed conservative measures  Leg edema Likely due to venous insufficiency.  Amlodipine also could be contributing.  Symptoms not problematic.  Discussed conservative management strategies including leg elevation and salt avoidance.  Chronic Problems Addressed Today: Decreased appetite Possibly could be part of dementia.  She describes what sounds like mild dysphagia but has no regurgitation or apparent weight loss.  We discussed referral to GI however will defer for now.  Recommended probiotic and fiber supplementation.  We will start Remeron to help with her appetite.  Follow-up in 3 months.  If has progression of dysphagia symptoms will rediscuss GI referral.  Osteoarthritis Following with orthopedics.  Will be getting hyaluronic acid injections soon.  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  Discussed importance of regular mental activity.  Discussed that over-the-counter vitamins do not have much scientific evidence at current time.  Patient's daughter is concerned about leaving patient at home during the day.  We will complete a form for her to have home health assistant.  Hypertension Slightly above goal.  We requested refill on captopril to use as needed.  Discussed that this was not a good idea to take in conjunction with losartan however stated that they will use it once a month or so and it works very well.  We will give refill today though instructed patient she should not pick at the same time as her losartan.  We will continue her other medications including Norvasc 2.5 mg daily, HCTZ 25 mg daily, and metoprolol 25 mg twice daily.     Subjective:  HPI:  She has no appetite and this happens every other 2 weeks or daily. Her daughter tries  to encourage her to eat but she does not budge. She sometimes feels that her food is heavy-usually she eats breakfast and lunch but not dinner. She does not even eat her favorite foods because she feels the same way. Denies any pain in her abdomen or chest region. She feels that the food is stuck in her throat at times. Her bowel is normal  and she denies having any constipation, diarrhea, blood in stool, or regurgitation.   Her daughter also has some concerns about her blood pressure. She had one episode of high blood pressure-she took Captopril as needed and it resolved her dizziness and headache. She also takes losartan 50 mg. She recently developed some knee pain. Additionally, she often feel cold even when the temperature reads 90 degrees. Otherwise, she is tolerating and compliant with all of her medications.       Objective:  Physical Exam: BP (!) 153/70   Pulse (!) 59   Temp 98.1 F (36.7 C) (Temporal)   Ht 5\' 1"  (1.549 m)   Wt 119 lb 6.4 oz (54.2 kg)   SpO2 99%   BMI 22.56 kg/m   Gen: No acute distress, resting comfortably MSK: Radial pulses 2+.  Cap refill intact distally. Neuro: Grossly normal, moves all extremities Psych: Normal affect and thought content      I,Alexis Bryant,acting as a scribe for , MD.,have documented all relevant documentation on the behalf of Lynn Doe, MD,as directed by  Lynn Doe, MD while in the presence of Lynn Doe, MD.   I, Lynn Doe, MD, have reviewed all documentation for this visit. The  documentation on 03/07/21 for the exam, diagnosis, procedures, and orders are all accurate and complete.  Time Spent: 45 minutes of total time was spent on the date of the encounter performing the following actions: chart review prior to seeing the patient, obtaining history, performing a medically necessary exam, counseling on the treatment plan, completing her requested form for home health aide, placing orders, and documenting in our  EHR.    Katina Degree. Jimmey Ralph, MD 03/07/2021 10:39 AM

## 2021-03-12 ENCOUNTER — Telehealth: Payer: Self-pay | Admitting: Family Medicine

## 2021-03-12 ENCOUNTER — Telehealth: Payer: Self-pay

## 2021-03-12 NOTE — Telephone Encounter (Signed)
Called and left a VM for patient's daughter to return my call concerning gel injection.

## 2021-03-12 NOTE — Telephone Encounter (Signed)
VOB has been submitted for Monovisc, right knee. Pending BV. 

## 2021-03-12 NOTE — Telephone Encounter (Signed)
Patient called asked if the gel injection has been approved?  The number to contact  patient is [phone]

## 2021-03-13 NOTE — Telephone Encounter (Signed)
Pt called stating that they were approved, and wanted to make an appt

## 2021-03-13 NOTE — Telephone Encounter (Signed)
Talked with patient and advised that process could take up to about 2-3 weeks.  Currently awaiting benefits from insurance, once received, patient will get a call to schedule.

## 2021-03-15 ENCOUNTER — Telehealth: Payer: Self-pay

## 2021-03-15 NOTE — Telephone Encounter (Signed)
Approved for Monovisc, right knee. Buy & Bill Patient will be responsible for 20% OOP. No Co-pay No PA required  Appt. 03/20/2021 with Dr. Prince Rome

## 2021-03-19 ENCOUNTER — Ambulatory Visit: Payer: Medicare Other | Admitting: Family Medicine

## 2021-03-20 ENCOUNTER — Other Ambulatory Visit: Payer: Self-pay | Admitting: Orthopaedic Surgery

## 2021-03-20 ENCOUNTER — Other Ambulatory Visit: Payer: Self-pay

## 2021-03-20 ENCOUNTER — Encounter: Payer: Self-pay | Admitting: Family Medicine

## 2021-03-20 ENCOUNTER — Ambulatory Visit (INDEPENDENT_AMBULATORY_CARE_PROVIDER_SITE_OTHER): Payer: Medicare Other | Admitting: Family Medicine

## 2021-03-20 VITALS — Ht 61.0 in | Wt 119.0 lb

## 2021-03-20 DIAGNOSIS — M545 Low back pain, unspecified: Secondary | ICD-10-CM

## 2021-03-20 DIAGNOSIS — M1711 Unilateral primary osteoarthritis, right knee: Secondary | ICD-10-CM

## 2021-03-20 MED ORDER — DICLOFENAC SODIUM 1 % EX GEL: 4.0000 g | Freq: Four times a day (QID) | CUTANEOUS | 3 refills | Status: DC | PRN

## 2021-03-20 MED ORDER — BACLOFEN 10 MG PO TABS: 5.0000 mg | ORAL_TABLET | Freq: Three times a day (TID) | ORAL | 3 refills | Status: DC | PRN

## 2021-03-20 NOTE — Progress Notes (Signed)
   Office Visit Note   Patient: Lynn [name]           Date of Birth: [birth year]           MRN: [id number] Visit Date: 03/20/2021 Requested by: Ardith Dark, MD [address] [place],  [place] [address] PCP: Ardith Dark, MD  Subjective: Chief Complaint  Patient presents with   Right Knee - Pain    Patient presents for right knee Monovisc injection.     HPI: She is here for planned right knee Monovisc injection.  Incidentally, her right lower back has been hurting for about a week, no injury.  No radicular symptoms.                ROS:   All other systems were reviewed and are negative.  Objective: Vital Signs: Ht 5\' 1"  (1.549 m)   Wt 119 lb (54 kg)   BMI 22.48 kg/m   Physical Exam:  General:  Alert and oriented, in no acute distress. Pulm:  Breathing unlabored. Psy:  Normal mood, congruent affect. Skin: No rash on her back Low back: She is tender to palpation along the right quadratus lumborum.  This seems to reproduce her pain. Right knee: 1+ effusion with no warmth.  Imaging: No results found.  Assessment & Plan: Right knee osteoarthritis - Monovisc injection today.  2.  Lumbar strain -Baclofen as needed.  Home stretches given.    Procedures: Right knee injection: After sterile prep with Betadine, injected 3 cc 1% lidocaine without epinephrine and Monovisc from superolateral approach, a flash of clear yellow synovial fluid was obtained prior to injection.       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 No pertinent surgical history. Social History   Occupational History   Occupation: Retired  Tobacco Use   Smoking status: Never   Smokeless tobacco: Never  Vaping Use   Vaping Use: Never used  Substance and Sexual Activity   Alcohol use: No   Drug use: No   Sexual activity: Not Currently

## 2021-03-28 ENCOUNTER — Other Ambulatory Visit: Payer: Self-pay | Admitting: Family Medicine

## 2021-04-12 ENCOUNTER — Ambulatory Visit: Payer: Medicare Other | Admitting: Family Medicine

## 2021-04-17 ENCOUNTER — Telehealth: Payer: Self-pay

## 2021-04-17 NOTE — Telephone Encounter (Signed)
..  Type of form received:  Parking Placard application  Additional comments: Patient has not completed her part / first time application/ daughter states patient has a hard time walk and that Dr. Jimmey Ralph is aware of this.   Received by:  Tameaka Eichhorn   Form should be Faxed to:  NA  Form should be mailed to:  NA  Is patient requesting call for pickup:  Call daughter for pick up   Form placed:  In parkers Leisure centre manager.  Provider will determine charge.  Individual made aware of 3-5 business day turn around (Y/N)?

## 2021-04-18 NOTE — Telephone Encounter (Signed)
Noted  

## 2021-04-22 NOTE — Telephone Encounter (Signed)
Form ready to be pick up Patient Daughter notified [phone] Form placed at front office  Copy send to scan

## 2021-05-28 ENCOUNTER — Ambulatory Visit: Payer: Self-pay

## 2021-05-28 ENCOUNTER — Ambulatory Visit (INDEPENDENT_AMBULATORY_CARE_PROVIDER_SITE_OTHER): Payer: Medicare Other | Admitting: Orthopaedic Surgery

## 2021-05-28 ENCOUNTER — Encounter: Payer: Self-pay | Admitting: Orthopaedic Surgery

## 2021-05-28 ENCOUNTER — Other Ambulatory Visit: Payer: Self-pay

## 2021-05-28 ENCOUNTER — Ambulatory Visit (INDEPENDENT_AMBULATORY_CARE_PROVIDER_SITE_OTHER): Payer: Medicare Other

## 2021-05-28 VITALS — Ht 61.0 in | Wt 119.0 lb

## 2021-05-28 DIAGNOSIS — M1712 Unilateral primary osteoarthritis, left knee: Secondary | ICD-10-CM

## 2021-05-28 DIAGNOSIS — M1711 Unilateral primary osteoarthritis, right knee: Secondary | ICD-10-CM | POA: Diagnosis not present

## 2021-05-28 DIAGNOSIS — M25561 Pain in right knee: Secondary | ICD-10-CM

## 2021-05-28 DIAGNOSIS — M545 Low back pain, unspecified: Secondary | ICD-10-CM

## 2021-05-28 DIAGNOSIS — G8929 Other chronic pain: Secondary | ICD-10-CM

## 2021-05-28 DIAGNOSIS — M25562 Pain in left knee: Secondary | ICD-10-CM | POA: Diagnosis not present

## 2021-05-28 DIAGNOSIS — M170 Bilateral primary osteoarthritis of knee: Secondary | ICD-10-CM

## 2021-05-28 MED ORDER — METHYLPREDNISOLONE ACETATE 40 MG/ML IJ SUSP
80.0000 mg | INTRAMUSCULAR | Status: AC | PRN
  Administered 2021-05-28: 80 mg via INTRA_ARTICULAR

## 2021-05-28 MED ORDER — BUPIVACAINE HCL 0.25 % IJ SOLN
2.0000 mL | INTRAMUSCULAR | Status: AC | PRN
  Administered 2021-05-28: 2 mL via INTRA_ARTICULAR

## 2021-05-28 MED ORDER — LIDOCAINE HCL 1 % IJ SOLN
2.0000 mL | INTRAMUSCULAR | Status: AC | PRN
  Administered 2021-05-28: 2 mL

## 2021-05-28 NOTE — Addendum Note (Signed)
Addended by: Wendi Maya on: 05/28/2021 03:56 PM   Modules accepted: Orders

## 2021-05-28 NOTE — Progress Notes (Signed)
Office Visit Note   Patient: Lynn [name]           Date of Birth: November 27, 1940           MRN: [id number] Visit Date: 05/28/2021              Requested by: Ardith Dark, MD [address] [place],  [place] [address] PCP: Ardith Dark, MD   Assessment & Plan: Visit Diagnoses:  1. Chronic pain of both knees   2. Bilateral primary osteoarthritis of knee     Plan: 80 year old female accompanied by her daughter who does the interpretation.  Has bilateral knee osteoarthritis by film worse on the left than the right but more symptomatic on the right.  Long discussion regarding her diagnosis and treatment options.  She may continue with the Celebrex and check with her family physician if she needs more.  Will inject the knee with cortisone.  Family aware that this is a "Band-Aid approach" and that definitive procedure would be knee replacement.  Do not think that that is necessary at this point. With further history there is considerable pain in the lumbar spine when she stands or walks for any distance and experiences pain in her buttock and both of her legs.  She certainly could have spinal stenosis which could be contributing to her knee pain.  We will order an MRI scan  Follow-Up Instructions: Return if symptoms worsen or fail to improve.   Orders:  Orders Placed This Encounter  Procedures   Large Joint Inj: R knee   XR KNEE 3 VIEW LEFT   XR KNEE 3 VIEW RIGHT   No orders of the defined types were placed in this encounter.     Procedures: Large Joint Inj: R knee on 05/28/2021 3:47 PM Indications: pain and diagnostic evaluation Details: 25 G 1.5 in needle, anteromedial approach  Arthrogram: No  Medications: 2 mL lidocaine 1 %; 80 mg methylPREDNISolone acetate 40 MG/ML; 2 mL bupivacaine 0.25 % Procedure, treatment alternatives, risks and benefits explained, specific risks discussed. Consent was given by the patient. Immediately prior to procedure a time out was called  to verify the correct patient, procedure, equipment, support staff and site/side marked as required. Patient was prepped and draped in the usual sterile fashion.      Clinical Data: No additional findings.   Subjective: Chief Complaint  Patient presents with   Right Knee - Pain   Left Knee - Pain  Patient presents today for chronic right knee pain. She has been a patient of Dr.Hilts and received a Monovisc injection in June. She said that the injection did not help. She has noticed that her left knee makes a "noise" when walking. She takes Celebrex, but states that it only helps for a few hours.   HPI  Review of Systems   Objective: Vital Signs: Ht 5\' 1"  (1.549 m)   Wt 119 lb (54 kg)   BMI 22.48 kg/m   Physical Exam Constitutional:      Appearance: She is well-developed.  Eyes:     Pupils: Pupils are equal, round, and reactive to light.  Pulmonary:     Effort: Pulmonary effort is normal.  Skin:    General: Skin is warm and dry.  Neurological:     Mental Status: She is alert and oriented to person, place, and time.  Psychiatric:        Behavior: Behavior normal.    Ortho Exam daughter acts as the interpreter.  Family from [place].  Might have very small effusion right knee.  The knee was not hot warm or red.  Mostly medial joint pain.  Some patella crepitation but minimal pain with compression.  No instability.  Full extension about 95 to 100 degrees of flexion.  No popliteal pain or mass.  No calf pain.  Painless range of motion both hips.  Specialty Comments:  No specialty comments available.  Imaging: XR KNEE 3 VIEW LEFT  Result Date: 05/28/2021 For nowFilms of the left knee are obtained in several projections standing.  There is more arthritis in the left than the right knee area of the medial compartment and irregularity of the distal femoral joint surface.  There are small peripheral osteophytes and subchondral sclerosis.  There was some mild degenerative changes in  both the patellofemoral and lateral compartments.  Films are consistent with moderate osteoarthritis predominantly in the medial compartment  XR KNEE 3 VIEW RIGHT  Result Date: 05/28/2021 Films of the right knee were obtained in several projections and compared to the films performed in May 2021.  There may be an osteochondral lesion of the medial femoral condyle and little bit more narrowing of the joint space compared to the old films.  Slight varus.  There is some subchondral sclerosis on both sides of the medial compartment but no osteophytes.  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 No pertinent surgical history. Social History   Occupational History   Occupation: Retired  Tobacco Use   Smoking status: Never   Smokeless tobacco: Never  Vaping Use   Vaping Use: Never used  Substance and Sexual Activity   Alcohol use: No   Drug use: No   Sexual activity: Not Currently

## 2021-05-31 ENCOUNTER — Telehealth: Payer: Self-pay

## 2021-05-31 NOTE — Telephone Encounter (Signed)
Patient's daughter called in stating that they received a message about a cancellation of Lynn [name]'s appt on 9/15. Advised Dr.Parker was going to be out and we needed to reschedule. Daughter then states that she has forms that need to be filled out by next week, she said that she dropped the forms off a few months ago and wasn't paying attention that the information was wrong.   Explained to daughter that we can schedule a follow up appt and then have her drop the forms off today with a note that specifies what corrections need to be made. Daughter then explained that she only has a copy of the old ones and doesn't have a blank one. Stated that she will bring a copy of the forms so we know what she is talking about.   Patient is scheduled for October 25th for an appointment, would we be able to fill out forms?

## 2021-06-03 NOTE — Telephone Encounter (Signed)
See below. Ok to fill out forms prior to 10/25 visit?

## 2021-06-03 NOTE — Telephone Encounter (Signed)
I will have to look at the forms first to see if we can fill them out.  Katina Degree. Jimmey Ralph, MD 06/03/2021 10:44 AM

## 2021-06-04 NOTE — Telephone Encounter (Signed)
See below

## 2021-06-06 ENCOUNTER — Ambulatory Visit: Payer: Medicare Other | Admitting: Family Medicine

## 2021-07-16 ENCOUNTER — Ambulatory Visit: Payer: Medicare Other | Admitting: Family Medicine

## 2021-08-19 ENCOUNTER — Other Ambulatory Visit: Payer: Medicare Other

## 2021-08-20 ENCOUNTER — Ambulatory Visit: Payer: Medicare Other | Admitting: Orthopaedic Surgery

## 2021-09-02 ENCOUNTER — Ambulatory Visit: Payer: Medicare Other | Admitting: Family Medicine

## 2021-09-11 ENCOUNTER — Other Ambulatory Visit: Payer: Self-pay | Admitting: Family Medicine

## 2021-09-26 ENCOUNTER — Other Ambulatory Visit: Payer: Self-pay | Admitting: Family Medicine

## 2021-10-23 ENCOUNTER — Encounter: Payer: Self-pay | Admitting: Family Medicine

## 2021-10-23 ENCOUNTER — Other Ambulatory Visit: Payer: Self-pay

## 2021-10-23 ENCOUNTER — Ambulatory Visit (INDEPENDENT_AMBULATORY_CARE_PROVIDER_SITE_OTHER): Payer: Medicare Other | Admitting: Family Medicine

## 2021-10-23 VITALS — BP 124/78 | HR 54 | Temp 98.0°F | Ht 61.0 in | Wt 122.0 lb

## 2021-10-23 DIAGNOSIS — R7303 Prediabetes: Secondary | ICD-10-CM

## 2021-10-23 DIAGNOSIS — I10 Essential (primary) hypertension: Secondary | ICD-10-CM

## 2021-10-23 MED ORDER — AMOXICILLIN 875 MG PO TABS: 875.0000 mg | ORAL_TABLET | Freq: Two times a day (BID) | ORAL | 0 refills | Status: DC

## 2021-10-23 MED ORDER — AZELASTINE HCL 0.1 % NA SOLN: 2.0000 | Freq: Two times a day (BID) | NASAL | 12 refills | Status: DC

## 2021-10-23 NOTE — Progress Notes (Signed)
° °  Lynn [name] is a 81 y.o. female who presents today for an office visit.  Assessment/Plan:  New/Acute Problems: Sinusitis No red flags.  She has evidence of otitis media as well.  We will start amoxicillin.  Start Astelin.  Encouraged hydration.  He can use over-the-counter meds as needed.  Chronic Problems Addressed Today: Hypertension Blood pressure is at goal on Norvasc 2.5 mg daily, HCTZ 25 mg daily metoprolol tartrate 25 mg twice daily.  Prediabetes Last A1c at goal.  Recheck next blood draw.  Will avoid prednisone.      Subjective:  HPI:  Patient here with cough and congestion. Started about 10 days ago. Tried OTC medications without significant improvement. Has had more coughing at night. Has some irritation in her throat for the last couple of days. Still some congestion. Some rhinorrhea. No fevers or chills.   See A/P for status of chronic conditions.       Objective:  Physical Exam: BP 124/78 (BP Location: Right Arm)    Pulse (!) 54    Temp 98 F (36.7 C) (Temporal)    Ht 5\' 1"  (1.549 m)    Wt 122 lb (55.3 kg)    SpO2 96%    BMI 23.05 kg/m   Gen: No acute distress, resting comfortably HNT: Left TM erythematous.  Right TM clear.  OP erythematous.  Nasal mucosa erythematous and boggy bilaterally. CV: Regular rate and rhythm with no murmurs appreciated Pulm: Normal work of breathing, clear to auscultation bilaterally with no crackles, wheezes, or rhonchi Neuro: Grossly normal, moves all extremities Psych: Normal affect and thought content      Lynn [name] M. Jerline Pain, MD 10/23/2021 11:18 AM

## 2021-10-23 NOTE — Assessment & Plan Note (Addendum)
Last A1c at goal.  Recheck next blood draw.  Will avoid prednisone.

## 2021-10-23 NOTE — Patient Instructions (Signed)
It was very nice to see you today!  You have an ear infection and probably sinus infection.  Please start the amoxicillin and nasal spray.  Let us know if not improving.  Take care, Dr Jimmey Ralph  PLEASE NOTE:  If you had any lab tests please let us know if you have not heard back within a few days. You may see your results on mychart before we have a chance to review them but we will give you a call once they are reviewed by [place]. If we ordered any referrals today, please let us know if you have not heard from their office within the next week.   Please try these tips to maintain a healthy lifestyle:  Eat at least 3 REAL meals and 1-2 snacks per day.  Aim for no more than 5 hours between eating.  If you eat breakfast, please do so within one hour of getting up.   Each meal should contain half fruits/vegetables, one quarter protein, and one quarter carbs (no bigger than a computer mouse)  Cut down on sweet beverages. This includes juice, soda, and sweet tea.   Drink at least 1 glass of water with each meal and aim for at least 8 glasses per day  Exercise at least 150 minutes every week.

## 2021-10-23 NOTE — Assessment & Plan Note (Signed)
Blood pressure is at goal on Norvasc 2.5 mg daily, HCTZ 25 mg daily metoprolol tartrate 25 mg twice daily.

## 2021-10-24 ENCOUNTER — Telehealth: Payer: Self-pay

## 2021-10-24 ENCOUNTER — Other Ambulatory Visit: Payer: Self-pay | Admitting: *Deleted

## 2021-10-24 MED ORDER — AZITHROMYCIN 250 MG PO TABS: ORAL_TABLET | ORAL | 0 refills | Status: AC

## 2021-10-24 NOTE — Telephone Encounter (Signed)
Ok to send in zpack or they can break the amoxicilin in half.   Algis Greenhouse. Jerline Pain, MD 10/24/2021 10:17 AM

## 2021-10-24 NOTE — Telephone Encounter (Signed)
States patient was prescribed amoxicillin yesterday.   States pills are too big and needs a smaller pill.  Would like to know if a z pak to be sent to [facility] on [address].

## 2021-10-24 NOTE — Telephone Encounter (Signed)
Spoke with patient daughter, stated she is unable to swallow amoxicillin an preferred Zpack  Zpack send to pharmacy

## 2021-10-24 NOTE — Telephone Encounter (Signed)
Please advise 

## 2021-10-25 ENCOUNTER — Ambulatory Visit: Payer: Medicare Other | Admitting: Family Medicine

## 2021-12-05 ENCOUNTER — Other Ambulatory Visit: Payer: Self-pay

## 2021-12-05 ENCOUNTER — Telehealth: Payer: Self-pay | Admitting: Orthopaedic Surgery

## 2021-12-05 ENCOUNTER — Ambulatory Visit (INDEPENDENT_AMBULATORY_CARE_PROVIDER_SITE_OTHER): Payer: Medicare Other

## 2021-12-05 ENCOUNTER — Encounter: Payer: Self-pay | Admitting: Orthopaedic Surgery

## 2021-12-05 ENCOUNTER — Ambulatory Visit (INDEPENDENT_AMBULATORY_CARE_PROVIDER_SITE_OTHER): Payer: Medicare Other | Admitting: Orthopaedic Surgery

## 2021-12-05 DIAGNOSIS — M542 Cervicalgia: Secondary | ICD-10-CM

## 2021-12-05 NOTE — Telephone Encounter (Signed)
Patient's daughter called. Says mom needs a refill on her muscle relaxer. Her call back number is [phone] ?

## 2021-12-05 NOTE — Progress Notes (Signed)
? ?Office Visit Note ?  ?Patient: Lynn [name]           ?Date of Birth: 1940-10-24           ?MRN: [id number] ?Visit Date: 12/05/2021 ?             ?Requested by: Vivi Barrack, MD ?[address] ?[place],  [place] [address] ?PCP: Vivi Barrack, MD ? ? ?Assessment & Plan: ?Visit Diagnoses:  ?1. Neck pain   ? ? ?Plan: 81 year old woman with a 4-day history of neck pain.  Denies any injuries denies any previous history of surgery.  She denies any paresthesias in her hands.  Does have some radiation of pain into her shoulders.  Her daughter accompanies and interprets for her.  She said she has been taking muscle relaxants as well as ibuprofen.  Pain is somewhat reduced to a 5 out of 10.  Mother denies any loss of strength in her hands.  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  We will provide her a cervical collar today which she can wear for comfort.  We would recommend physical therapy and modalities.  Her daughter would like to look into this first but will contact us if she like a referral sent.  Follow-up if no improvement or advancement of symptoms she will continue low-dose Advil and muscle relaxant as needed ? ?Follow-Up Instructions: No follow-ups on file.  ? ?Orders:  ?Orders Placed This Encounter  ?Procedures  ? XR Cervical Spine 2 or 3 views  ? ?No orders of the defined types were placed in this encounter. ? ? ? ? Procedures: ?No procedures performed ? ? ?Clinical Data: ?No additional findings. ? ? ?Subjective: ?Chief Complaint  ?Patient presents with  ? Neck - Pain  ?Patient presents today for neck pain. She said that a few nights ago she developed severe neck pain. No injury. She has pain that radiates into both shoulders. She has tried massage, muscle relaxers, and tylenol. There has been some improvement, but still in a lot of pain. She cannot rotate her head. No numbness or tingling. No pain down either arm. ? ?HPI ? ?Review of Systems  ?All other systems reviewed  and are negative. ? ? ?Objective: ?Vital Signs: There were no vitals taken for this visit. ? ?Physical Exam ?Constitutional:   ?   Appearance: Normal appearance.  ?Pulmonary:  ?   Effort: Pulmonary effort is normal.  ?Neurological:  ?   General: No focal deficit present.  ?   Mental Status: She is alert.  ? ? ?Ortho Exam ?Examination of her cervical spine she has no step-off but is she is diffusely tender to palpation radiates into the paravertebral muscles on both sides of her neck.  She does have 5 out of 5 strength in her hands.  No paresthesias in her hands or arms.  She cannot extend her neck to look up at the ceiling even slightly without it causing pain.  Turning side to side also exacerbates her symptoms as well as flexing her neck.  Neurologically she is intact ?Specialty Comments:  ?No specialty comments available. ? ?Imaging: ?No results found. ? ? ?PMFS History: ?Patient Active Problem List  ? Diagnosis Date Noted  ? Bilateral primary osteoarthritis of knee 05/28/2021  ? Decreased appetite 03/07/2021  ? Osteoarthritis 01/09/2021  ? Moderate dementia without behavioral disturbance 11/19/2019  ? Prediabetes 11/19/2019  ? Hypertension 08/03/2019  ? 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 Hyperlipidemia 03/01/2017  ? ?  Past Medical History:  ?Diagnosis Date  ? Arthritis   ? 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 Edema 03/01/2017  ? 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 Hyperlipidemia 03/01/2017  ? Hypertension   ? Memory loss   ? Mild cognitive disorder 06/14/2018  ?  ?Family History  ?Problem Relation Age of Onset  ? Diverticulitis Mother   ? Other Father   ?     unsure  ? Hypertension Sister   ?  ?No past surgical history on file. ?Social History  ? ?Occupational History  ? Occupation: Retired  ?Tobacco Use  ? Smoking status: Never  ? Smokeless tobacco: Never  ?Vaping Use  ? Vaping Use: Never used  ?Substance and Sexual Activity  ? Alcohol use: No  ? Drug use: No  ? Sexual activity: Not Currently  ? ? ? ? ? ? ?

## 2021-12-06 ENCOUNTER — Other Ambulatory Visit: Payer: Self-pay | Admitting: Physician Assistant

## 2021-12-06 MED ORDER — BACLOFEN 10 MG PO TABS: 5.0000 mg | ORAL_TABLET | Freq: Three times a day (TID) | ORAL | 3 refills | Status: AC | PRN

## 2021-12-06 NOTE — Telephone Encounter (Signed)
Called into pharmacy

## 2021-12-13 ENCOUNTER — Ambulatory Visit (INDEPENDENT_AMBULATORY_CARE_PROVIDER_SITE_OTHER): Payer: Medicare Other | Admitting: Family Medicine

## 2021-12-13 ENCOUNTER — Encounter: Payer: Self-pay | Admitting: Family Medicine

## 2021-12-13 VITALS — BP 173/84 | HR 60 | Temp 98.6°F | Ht 61.0 in | Wt 121.4 lb

## 2021-12-13 DIAGNOSIS — I10 Essential (primary) hypertension: Secondary | ICD-10-CM | POA: Diagnosis not present

## 2021-12-13 DIAGNOSIS — M1990 Unspecified osteoarthritis, unspecified site: Secondary | ICD-10-CM

## 2021-12-13 DIAGNOSIS — R7303 Prediabetes: Secondary | ICD-10-CM

## 2021-12-13 DIAGNOSIS — Z13220 Encounter for screening for lipoid disorders: Secondary | ICD-10-CM

## 2021-12-13 LAB — LIPID PANEL
Cholesterol: 133 mg/dL (ref 0–200)
HDL: 47.3 mg/dL (ref >39.00)
LDL Cholesterol: 73 mg/dL (ref 0–99)
NonHDL: 85.93
Total CHOL/HDL Ratio: 3
Triglycerides: 67 mg/dL (ref 0.0–149.0)
VLDL: 13.4 mg/dL (ref 0.0–40.0)

## 2021-12-13 LAB — CBC
HCT: 35.2 % — ABNORMAL LOW (ref 36.0–46.0)
Hemoglobin: 11.9 g/dL — ABNORMAL LOW (ref 12.0–15.0)
MCHC: 33.7 g/dL (ref 30.0–36.0)
MCV: 88.5 fl (ref 78.0–100.0)
Platelets: 297 10*3/uL (ref 150.0–400.0)
RBC: 3.98 Mil/uL (ref 3.87–5.11)
RDW: 13.2 % (ref 11.5–15.5)
WBC: 5.5 10*3/uL (ref 4.0–10.5)

## 2021-12-13 LAB — COMPREHENSIVE METABOLIC PANEL
ALT: 13 U/L (ref 0–35)
AST: 22 U/L (ref 0–37)
Albumin: 3.9 g/dL (ref 3.5–5.2)
Alkaline Phosphatase: 59 U/L (ref 39–117)
BUN: 16 mg/dL (ref 6–23)
CO2: 33 mEq/L — ABNORMAL HIGH (ref 19–32)
Calcium: 9.3 mg/dL (ref 8.4–10.5)
Chloride: 101 mEq/L (ref 96–112)
Creatinine, Ser: 0.85 mg/dL (ref 0.40–1.20)
GFR: 64.41 mL/min (ref >60.00)
Glucose, Bld: 94 mg/dL (ref 70–99)
Potassium: 4.8 mEq/L (ref 3.5–5.1)
Sodium: 139 mEq/L (ref 135–145)
Total Bilirubin: 0.5 mg/dL (ref 0.2–1.2)
Total Protein: 6.7 g/dL (ref 6.0–8.3)

## 2021-12-13 LAB — HEMOGLOBIN A1C: Hgb A1c MFr Bld: 6.3 % (ref 4.6–6.5)

## 2021-12-13 NOTE — Assessment & Plan Note (Signed)
Follows with orthopedics though is having some worsening neck pain.  They would like to see physical therapy.  We will place referral today. ?

## 2021-12-13 NOTE — Assessment & Plan Note (Signed)
Check A1c. 

## 2021-12-13 NOTE — Assessment & Plan Note (Signed)
Above goal today though typically has been well controlled.  She has not taken any blood pressure medications today.  She is currently on HCTZ 25 mg daily and Metroprolol tartrate 25 mg twice daily, and losartan 50 mg twice daily.  She uses Norvasc 2.5 mg daily as needed.  They will continue to monitor at home and let me know if persistently elevated.  Would likely increase dose of metoprolol tartrate if needed.  ?

## 2021-12-13 NOTE — Progress Notes (Signed)
? ?  Lynn [name] is a 81 y.o. female who presents today for an office visit. ? ?Assessment/Plan:  ?New/Acute Problems: ?Leg Edema ?No red flags.  Possibly secondary to venous insufficiency that could be a side effect of her amlodipine.  We discussed conservative measures including frequent elevation and salt avoidance.  We will check labs today. ? ?Chronic Problems Addressed Today: ?Hypertension ?Above goal today though typically has been well controlled.  She has not taken any blood pressure medications today.  She is currently on HCTZ 25 mg daily and Metroprolol tartrate 25 mg twice daily, and losartan 50 mg twice daily.  She uses Norvasc 2.5 mg daily as needed.  They will continue to monitor at home and let me know if persistently elevated.  Would likely increase dose of metoprolol tartrate if needed.  ? ?Prediabetes ?Check A1c.  ? ?Osteoarthritis ?Follows with orthopedics though is having some worsening neck pain.  They would like to see physical therapy.  We will place referral today. ? ? ?  ?Subjective:  ?HPI: ? ?Patient here with swelling in her legs. Started suddenly about 4 days ago. Located on both legs. No reported chest pain or shortness of breath.  No recent increase salt intake.  She has been taking her amlodipine more frequently due to elevated blood pressure readings.No orthopnea. ? ?She has had some issues with neck pain. Orthopedics recommended baclofen and heating pads. Pain is about the same.  ? ?   ?  ?Objective:  ?Physical Exam: ?BP (!) 173/84 (BP Location: Right Arm)   Pulse 60   Temp 98.6 ?F (37 ?C) (Temporal)   Ht 5\' 1"  (1.549 m)   Wt 121 lb 6.4 oz (55.1 kg)   SpO2 98%   BMI 22.94 kg/m?   ?Gen: No acute distress, resting comfortably ?CV: Regular rate and rhythm with no murmurs appreciated ?Pulm: Normal work of breathing, clear to auscultation bilaterally with no crackles, wheezes, or rhonchi ?MSK: Trace nonpitting edema to bilateral ankles. ?Neuro: Grossly normal, moves  all extremities ?Psych: Normal affect and thought content ? ?   ? ?Algis Greenhouse. Jerline Pain, MD ?12/13/2021 10:50 AM  ?

## 2021-12-13 NOTE — Patient Instructions (Signed)
It was very nice to see you today! ? ?We will check blood work today.  Please keep your legs elevated and avoid salt. ? ?I will refer her to see physical therapy for her neck.  Keep an eye on the blood pressure at home and let me know if it is persistently 150/90 or higher. ? ?Take care, ?Dr Jimmey Ralph ? ?PLEASE NOTE: ? ?If you had any lab tests please let us know if you have not heard back within a few days. You may see your results on mychart before we have a chance to review them but we will give you a call once they are reviewed by [place]. If we ordered any referrals today, please let us know if you have not heard from their office within the next week.  ? ?Please try these tips to maintain a healthy lifestyle: ? ?Eat at least 3 REAL meals and 1-2 snacks per day.  Aim for no more than 5 hours between eating.  If you eat breakfast, please do so within one hour of getting up.  ? ?Each meal should contain half fruits/vegetables, one quarter protein, and one quarter carbs (no bigger than a computer mouse) ? ?Cut down on sweet beverages. This includes juice, soda, and sweet tea.  ? ?Drink at least 1 glass of water with each meal and aim for at least 8 glasses per day ? ?Exercise at least 150 minutes every week.   ?

## 2021-12-17 LAB — TSH: TSH: 3.25 u[IU]/mL (ref 0.35–5.50)

## 2021-12-19 NOTE — Progress Notes (Signed)
Please inform patient of the following: ? ?Blood work is all stable. Would like for them to let us know if her blood pressure is still running up or if she is still having swelling and we can adjust her medications. ? ?Katina Degree. Jimmey Ralph, MD ?12/19/2021 2:46 PM  ?

## 2021-12-21 ENCOUNTER — Other Ambulatory Visit: Payer: Self-pay | Admitting: Family Medicine

## 2021-12-21 DIAGNOSIS — I10 Essential (primary) hypertension: Secondary | ICD-10-CM

## 2021-12-23 ENCOUNTER — Ambulatory Visit: Payer: Medicare Other | Admitting: Physical Therapy

## 2022-01-08 ENCOUNTER — Other Ambulatory Visit: Payer: Self-pay | Admitting: Family Medicine

## 2022-01-08 DIAGNOSIS — E785 Hyperlipidemia, unspecified: Secondary | ICD-10-CM

## 2022-02-27 ENCOUNTER — Telehealth: Payer: Self-pay | Admitting: Family Medicine

## 2022-02-27 NOTE — Telephone Encounter (Signed)
Patients daughter called stated she lost her DME bath tub chair rx- stated she would like another copy of it if possible - Daughter stated she can come in and pick up form if needed.

## 2022-03-03 NOTE — Telephone Encounter (Signed)
Left message to return call to our office at their convenience.  

## 2022-03-04 NOTE — Telephone Encounter (Signed)
Daughter notified DME faxed to  [facility] # [phone].

## 2022-03-04 NOTE — Telephone Encounter (Signed)
LVM DME printed and placed in front office ready to be pick up  If needed to be faxed please let us know

## 2022-03-04 NOTE — Telephone Encounter (Signed)
DME stool chair faxed to [facility] [phone]

## 2022-03-04 NOTE — Telephone Encounter (Signed)
[phone] fax for documents to daughter, as appropriate  [employer] DME information requested by daughter.   Please put daughter's number instead of pt's.

## 2022-03-07 ENCOUNTER — Ambulatory Visit: Payer: Medicare Other | Admitting: Physician Assistant

## 2022-03-10 ENCOUNTER — Encounter: Payer: Self-pay | Admitting: Family Medicine

## 2022-03-10 ENCOUNTER — Ambulatory Visit (INDEPENDENT_AMBULATORY_CARE_PROVIDER_SITE_OTHER): Payer: Medicare Other | Admitting: Family Medicine

## 2022-03-10 VITALS — BP 130/66 | HR 61 | Temp 98.5°F | Ht 61.0 in | Wt 122.2 lb

## 2022-03-10 DIAGNOSIS — H7292 Unspecified perforation of tympanic membrane, left ear: Secondary | ICD-10-CM

## 2022-03-10 DIAGNOSIS — H65195 Other acute nonsuppurative otitis media, recurrent, left ear: Secondary | ICD-10-CM | POA: Diagnosis not present

## 2022-03-10 MED ORDER — CEFDINIR 300 MG PO CAPS: 300.0000 mg | ORAL_CAPSULE | Freq: Two times a day (BID) | ORAL | 0 refills | Status: DC

## 2022-03-10 NOTE — Patient Instructions (Signed)
It was very nice to see you today!  Referring to ENT doctor    PLEASE NOTE:  If you had any lab tests please let us know if you have not heard back within a few days. You may see your results on MyChart before we have a chance to review them but we will give you a call once they are reviewed by [place]. If we ordered any referrals today, please let us know if you have not heard from their office within the next week.   Please try these tips to maintain a healthy lifestyle:  Eat most of your calories during the day when you are active. Eliminate processed foods including packaged sweets (pies, cakes, cookies), reduce intake of potatoes, white bread, white pasta, and white rice. Look for whole grain options, oat flour or almond flour.  Each meal should contain half fruits/vegetables, one quarter protein, and one quarter carbs (no bigger than a computer mouse).  Cut down on sweet beverages. This includes juice, soda, and sweet tea. Also watch fruit intake, though this is a healthier sweet option, it still contains natural sugar! Limit to 3 servings daily.  Drink at least 1 glass of water with each meal and aim for at least 8 glasses per day  Exercise at least 150 minutes every week.

## 2022-03-10 NOTE — Progress Notes (Signed)
Subjective:     Patient ID: Lynn [name], female    DOB: [birth year], 81 y.o.   MRN: [id number]  Chief Complaint  Patient presents with   Ear Drainage    Left ear discharge that started 7 days ago Head feels heavy    HPI-here w/dau L ear d/c for 7 days.  Problems for yrs.  Pillow wet in am. But this am, pillow not as wet.  Ear feels "heavy"  and whole head feels "heavy". No runny nose. No f/c.   Some dizziness last wk, better today. HOH L ear long time.   There are no preventive care reminders to display for this patient.  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 No pertinent surgical history.  Outpatient Medications Prior to Visit  Medication Sig Dispense Refill   amLODipine (NORVASC) 2.5 MG tablet Take 1 tablet (2.5 mg total) by mouth daily. 90 tablet 1   aspirin EC 81 MG tablet Take 81 mg by mouth every other day. Swallow whole.     atorvastatin (LIPITOR) 10 MG tablet TAKE 1 TABLET (10 MG TOTAL) BY MOUTH DAILY AT 6 PM. 90 tablet 3   azelastine (ASTELIN) 0.1 % nasal spray Place 2 sprays into both nostrils 2 (two) times daily. 30 mL 12   baclofen (LIORESAL) 10 MG tablet Take 0.5-1 tablets (5-10 mg total) by mouth 3 (three) times daily as needed for muscle spasms. 30 each 3   calcium carbonate (OS-CAL - DOSED IN MG OF ELEMENTAL CALCIUM) 1250 (500 Ca) MG tablet Take 1 tablet by mouth 3 (three) times a week.     captopril (CAPOTEN) 25 MG tablet Take 25 mg by mouth daily as needed.     diclofenac Sodium (VOLTAREN) 1 % GEL Apply 4 g topically 4 (four) times daily as needed. 500 g 3   hydrochlorothiazide (HYDRODIURIL) 25 MG tablet TAKE 1 TABLET (25 MG TOTAL) BY MOUTH DAILY. 90 tablet 3   ibuprofen (ADVIL,MOTRIN) 200 MG tablet Take 200 mg by mouth every 6 (six) hours  as needed.     losartan (COZAAR) 50 MG tablet TAKE 1 TABLET BY MOUTH TWICE A DAY 180 tablet 3   metoprolol tartrate (LOPRESSOR) 25 MG tablet TAKE 1 TABLET BY MOUTH TWICE A DAY 180 tablet 3   mirtazapine (REMERON) 7.5 MG tablet TAKE 1 TABLET BY MOUTH EVERYDAY AT BEDTIME 90 tablet 1   Multiple Vitamin (MULTIVITAMIN) tablet Take 1 tablet by mouth 2 (two) times a week.     Pyridoxine HCl (VITAMIN B-6 PO) Take by mouth.     Thiamine HCl (VITAMIN B-1 PO) Take by mouth.     No facility-administered medications prior to visit.    No Known Allergies ROS neg/noncontributory except as noted HPI/below      Objective:     BP 130/66   Pulse 61   Temp 98.5 F (36.9 C) (Temporal)   Ht 5\' 1"  (1.549 m)   Wt 122 lb 4 oz (55.5 kg)   SpO2 97%   BMI 23.10 kg/m  Wt Readings from Last 3 Encounters:  03/10/22 122 lb 4 oz (55.5 kg)  12/13/21 121 lb 6.4 oz (55.1 kg)  10/23/21 122 lb (55.3 kg)    Physical Exam   Gen: WDWN NAD  HEENT: NCAT, conjunctiva not injected, sclera nonicteric R tm sl pink and some clear fluid behind TM.  LTM-large hole and yellow fluid.  No TTP mastoid NECK:  supple, no thyromegaly, no nodes, no carotid bruits CARDIAC: RRR, S1S2+, no murmur. LUNGS: CTAB. No wheezes EXT:  no edema MSK: no gross abnormalities.  NEURO: A&O x3.  CN II-XII intact.  PSYCH: normal mood. Good eye contact     Assessment & Plan:   Problem List Items Addressed This Visit   None Visit Diagnoses     Other recurrent acute nonsuppurative otitis media of left ear    -  Primary   Relevant Medications   cefdinir (OMNICEF) 300 MG capsule   Other Relevant Orders   Ambulatory referral to ENT   Perforation of left tympanic membrane       Relevant Orders   Ambulatory referral to ENT      Chronic perf L tm.  Occ drains, now for 7 days.  Will tx omnicef 300mg  bid x 5 days.  Refer ENT.  Worse, new symptoms, etc, f/u.   Meds ordered this encounter  Medications   cefdinir (OMNICEF) 300 MG capsule     Sig: Take 1 capsule (300 mg total) by mouth 2 (two) times daily.    Dispense:  10 capsule    Refill:  0    , MD

## 2022-03-11 ENCOUNTER — Telehealth: Payer: Self-pay

## 2022-03-11 NOTE — Telephone Encounter (Signed)
Mailed referral to [facility] ENT. Dr. Jearld Fenton does not see patients that are referred. Only trauma patients who were seen by him in the ED.

## 2022-03-18 ENCOUNTER — Other Ambulatory Visit: Payer: Self-pay | Admitting: Family Medicine

## 2022-04-30 ENCOUNTER — Telehealth: Payer: Self-pay | Admitting: Family Medicine

## 2022-04-30 NOTE — Telephone Encounter (Signed)
Copied from CRM [id number]. Topic: Medicare AWV >> Apr 30, 2022  2:10 PM Payton Doughty wrote: Reason for CRM: Called patient to schedule Annual Wellness Visit.  Please schedule with Nurse Health Advisor Lanier Ensign, RN at [facility]. This appt can be telephone or office visit. Please call [phone] ask for [facility]

## 2022-05-14 ENCOUNTER — Ambulatory Visit: Payer: Medicare Other | Admitting: Orthopaedic Surgery

## 2022-05-21 ENCOUNTER — Encounter: Payer: Self-pay | Admitting: Orthopaedic Surgery

## 2022-05-21 ENCOUNTER — Ambulatory Visit: Payer: Self-pay

## 2022-05-21 ENCOUNTER — Other Ambulatory Visit: Payer: Self-pay | Admitting: Physician Assistant

## 2022-05-21 ENCOUNTER — Ambulatory Visit (INDEPENDENT_AMBULATORY_CARE_PROVIDER_SITE_OTHER): Payer: Medicare Other | Admitting: Orthopaedic Surgery

## 2022-05-21 DIAGNOSIS — M545 Low back pain, unspecified: Secondary | ICD-10-CM | POA: Diagnosis not present

## 2022-05-21 DIAGNOSIS — G8929 Other chronic pain: Secondary | ICD-10-CM

## 2022-05-21 DIAGNOSIS — M5440 Lumbago with sciatica, unspecified side: Secondary | ICD-10-CM | POA: Diagnosis not present

## 2022-05-21 MED ORDER — DICLOFENAC SODIUM 1 % EX GEL: 4.0000 g | Freq: Four times a day (QID) | CUTANEOUS | 3 refills | Status: DC | PRN

## 2022-05-21 NOTE — Progress Notes (Signed)
Restful  Office Visit Note   Patient: Lynn [name]           Date of Birth: [birth year]           MRN: [id number] Visit Date: 05/21/2022              Requested by: Ardith Dark, MD [address] [place],  [place] [address] PCP: Ardith Dark, MD   Assessment & Plan: Visit Diagnoses:  1. Chronic low back pain, unspecified back pain laterality, unspecified whether sciatica present   2. Acute bilateral low back pain with sciatica, sciatica laterality unspecified     Plan: Patient is a pleasant 81 year old woman who is accompanied by her daughter who interprets for her today.  She has a long history of lower back pain.  She denies any new injury or any changes in her pain except that it is progressed further down her legs from her thighs down to her ankles.  Denies any loss of bowel or bladder control she had an MRI done in 2021 by Dr. Magnus Ivan.  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  At that time she was offered referral to Dr. Alvester Morin but she had shoulder issues going on and knee issues and admits that she really did not do anything for her back.  She did take baclofen but it made her dizzy.  She does wear a lumbar support and would like a new one today.  She also uses Voltaren gel topically which helps a little bit would like a new prescription for this.  She thinks she would also like to be referred to Dr. Alvester Morin for epidural versus facet injections.  We have given her this referral today  Follow-Up Instructions: Return We will reapply a new lumbar support and consult Dr. Alvester Morin for injections.   Orders:  Orders Placed This Encounter  Procedures   XR Lumbar Spine 2-3 Views   Ambulatory referral to Physical Medicine Rehab   No orders of the defined types were placed in this encounter.     Procedures: No procedures  performed   Clinical Data: No additional findings.   Subjective: Chief Complaint  Patient presents with   Lower Back - Pain  Patient presents today for lower back pain. She said that she has had lower back pain for many years. It got better, but returned again a few months ago. Her pain radiates into both legs. No numbness or tingling in either leg. She takes Ibuprofen for pain relief. She was given baclofen, but states that it causes dizziness and does not help with her pain. She has had a prior MRI in 2021.   HPI  Review of Systems  All other systems reviewed and are negative.    Objective: Vital Signs: There were no vitals taken for this visit.  Physical Exam Constitutional:      Appearance: Normal appearance.  Pulmonary:     Effort: Pulmonary effort is normal.  Neurological:     General: No focal deficit present.     Mental Status: She is alert.     Ortho Exam Examination of her lower back she has no palpable deformity or step-offs.  She has pain equally bilaterally in the lower back and buttock.  Radiates down the thigh to the top of her ankles.  No paresthesias or sensation changes in her feet.  She has  good strength with dorsiflexion plantarflexion of her ankles extension and flexion of her knees flexion of her hips.  Negative straight leg raise bilaterally.  Specialty Comments:  No specialty comments available.  Imaging: No results found.   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 No pertinent surgical history. Social History   Occupational History   Occupation: Retired  Tobacco Use   Smoking status: Never   Smokeless tobacco: Never  Vaping Use   Vaping Use: Never used  Substance and Sexual Activity   Alcohol use: No   Drug use: No   Sexual activity: Not Currently

## 2022-05-22 ENCOUNTER — Ambulatory Visit (INDEPENDENT_AMBULATORY_CARE_PROVIDER_SITE_OTHER): Payer: Medicare Other | Admitting: Family Medicine

## 2022-05-22 VITALS — BP 149/80 | HR 53 | Temp 98.4°F | Ht 61.0 in | Wt 123.2 lb

## 2022-05-22 DIAGNOSIS — R600 Localized edema: Secondary | ICD-10-CM | POA: Insufficient documentation

## 2022-05-22 DIAGNOSIS — Z23 Encounter for immunization: Secondary | ICD-10-CM

## 2022-05-22 DIAGNOSIS — I10 Essential (primary) hypertension: Secondary | ICD-10-CM | POA: Diagnosis not present

## 2022-05-22 NOTE — Progress Notes (Signed)
   Azuri Rosalind Guido is a 81 y.o. female who presents today for an office visit.  Assessment/Plan:  Chronic Problems Addressed Today: Leg edema No red flags.  Likely secondary to venous insufficiency.  Recently was on vacation which include sitting in a car for few hours and eating out at restaurants.  It is reassuring that symptoms have improved the last couple days.  We discussed conservative measures including leg elevation and salt avoidance.  They also have compression stockings to use on hand if needed.  Reassured patient.  She does not have any other red flag signs or symptoms concerning for CHF.  Would consider trial of Lasix if symptoms persist however we will try conservative management as above first.  Hypertension At goal per JNC 8 on HCTZ 25 mg daily, losartan 50 mg twice daily, and metoprolol tartrate 25 mg twice daily.  If blood pressure continues to be elevated would consider addition of spironolactone to help treat above leg edema as well.  Prevnar 20 given today.    Subjective:  HPI:  See A/p for status of chronic conditions.  She is here with her daughter today who helps with interpretation.   Her main concern today is leg swelling.  She has had this on and off for several years but seems to have worsened several days ago.  They were recently on vacation at the beach and she was in the car for about 3 hours.  She is not sure if this is contributing.  Since being home the last couple of days symptoms seem to be improved.  No reported chest pain or shortness of breath.  No medication changes.  No recent illnesses.       Objective:  Physical Exam: BP (!) 149/80   Pulse (!) 53   Temp 98.4 F (36.9 C) (Temporal)   Ht 5\' 1"  (1.549 m)   Wt 123 lb 3.2 oz (55.9 kg)   SpO2 96%   BMI 23.28 kg/m   Gen: No acute distress, resting comfortably CV: Regular rate and rhythm with no murmurs appreciated Pulm: Normal work of breathing, clear to auscultation bilaterally  with no crackles, wheezes, or rhonchi MSK: Trace edema to midshin bilaterally Neuro: Grossly normal, moves all extremities Psych: Normal affect and thought content      Lucillie Kiesel M. , MD 05/22/2022 12:35 PM

## 2022-05-22 NOTE — Assessment & Plan Note (Signed)
At goal per JNC 8 on HCTZ 25 mg daily, losartan 50 mg twice daily, and metoprolol tartrate 25 mg twice daily.  If blood pressure continues to be elevated would consider addition of spironolactone to help treat above leg edema as well.

## 2022-05-22 NOTE — Assessment & Plan Note (Addendum)
No red flags.  Likely secondary to venous insufficiency.  Recently was on vacation which include sitting in a car for few hours and eating out at restaurants.  It is reassuring that symptoms have improved the last couple days.  We discussed conservative measures including leg elevation and salt avoidance.  They also have compression stockings to use on hand if needed.  Reassured patient.  She does not have any other red flag signs or symptoms concerning for CHF.  Would consider trial of Lasix if symptoms persist however we will try conservative management as above first.

## 2022-05-22 NOTE — Patient Instructions (Addendum)
It was very nice to see you today!  Please keep your legs elevated and avoid salt.  You can also use compression stockings.  Please let me know if the swelling is not improving and we can start a medication.  We will give your pneumonia shot today.  Please keep an eye on your blood pressure and let me know if it is persistently elevated.  Please come back in 3 to 6 months.  Come back sooner if needed.  Take care, Dr Jimmey Ralph  PLEASE NOTE:  If you had any lab tests please let us know if you have not heard back within a few days. You may see your results on mychart before we have a chance to review them but we will give you a call once they are reviewed by [place]. If we ordered any referrals today, please let us know if you have not heard from their office within the next week.   Please try these tips to maintain a healthy lifestyle:  Eat at least 3 REAL meals and 1-2 snacks per day.  Aim for no more than 5 hours between eating.  If you eat breakfast, please do so within one hour of getting up.   Each meal should contain half fruits/vegetables, one quarter protein, and one quarter carbs (no bigger than a computer mouse)  Cut down on sweet beverages. This includes juice, soda, and sweet tea.   Drink at least 1 glass of water with each meal and aim for at least 8 glasses per day  Exercise at least 150 minutes every week.

## 2022-05-27 ENCOUNTER — Other Ambulatory Visit: Payer: Self-pay | Admitting: Orthopaedic Surgery

## 2022-05-27 DIAGNOSIS — G8929 Other chronic pain: Secondary | ICD-10-CM

## 2022-06-02 ENCOUNTER — Telehealth: Payer: Medicare Other | Admitting: Family Medicine

## 2022-06-02 ENCOUNTER — Telehealth (INDEPENDENT_AMBULATORY_CARE_PROVIDER_SITE_OTHER): Payer: Medicare Other | Admitting: Family Medicine

## 2022-06-02 ENCOUNTER — Encounter: Payer: Self-pay | Admitting: Family Medicine

## 2022-06-02 VITALS — BP 143/64 | Temp 102.1°F | Ht 61.0 in

## 2022-06-02 DIAGNOSIS — U071 COVID-19: Secondary | ICD-10-CM | POA: Diagnosis not present

## 2022-06-02 MED ORDER — NIRMATRELVIR/RITONAVIR (PAXLOVID)TABLET: 3.0000 | ORAL_TABLET | Freq: Two times a day (BID) | ORAL | 0 refills | Status: AC

## 2022-06-02 NOTE — Progress Notes (Signed)
Virtual Visit via Video Note  I connected with Ziana Shafiei Roudbari on 06/02/22 at 5:35 PM by a video enabled telemedicine application and verified that I am speaking with the correct person using two identifiers.  Patient location: home, with daughter Elvin So.  My location: office - [place].    I discussed the limitations, risks, security and privacy concerns of performing an evaluation and management service by telephone and the availability of in person appointments. I also discussed with the patient that there may be a patient responsible charge related to this service. The patient expressed understanding and agreed to proceed, consent obtained  Chief complaint:  Chief Complaint  Patient presents with   Covid Positive    Pt daughter states she has no energy cannot walk or talk, took temp and it was 99.5, pt daughter states it started 24 hours ago, fever is 100.3    History of Present Illness: Ramsha Asal Teas is a 81 y.o. female  Covid 61 infection: Initial history with dtr, then with dtr translating on video  Went shopping with dtr 2 days ago.  Initial symptoms started yesterday, fatigue, head felt foggy, tired. Trouble sleeping last night, chills. Temp 99-100 last night. Temp up to 102-103 today. No difficulty speaking, but not feeling like walking or talking.  Drinking some milk and honey, soup, tea with cookie today. Able to eat banana last night. Drinking fluids today. Urinating normally. No vomiting.  Treatment tylenol.  Positive covid test this morning Current symptoms - chills, no energy. No chest pain, but having some shortness of breath with walking.  Min cough. No confusion. Previous COVID vaccinations below including September 2022. eGFR 64.41 on 12/13/2021 She is on statin medication, Lipitor 10 mg daily - last dose last night. Not taking amlodipine,   Problem  reviewed, history of moderate dementia, hyperlipidemia, prediabetes, hypertension.    Immunization History  Administered Date(s) Administered   Influenza,inj,Quad PF,6+ Mos 05/12/2018, 07/01/2019, 07/19/2020   Influenza-Unspecified 06/21/2021   PFIZER(Purple Top)SARS-COV-2 Vaccination 11/05/2019, 11/30/2019, 09/07/2020, 06/05/2021   PNEUMOCOCCAL CONJUGATE-20 05/22/2022     Patient Active Problem List   Diagnosis Date Noted   Leg edema 05/22/2022   Low back pain 05/21/2022   Bilateral primary osteoarthritis of knee 05/28/2021   Decreased appetite 03/07/2021   Osteoarthritis 01/09/2021   Moderate dementia without behavioral disturbance ([place]) 11/19/2019   Prediabetes 11/19/2019   Hypertension 08/03/2019   Enlarged thyroid gland 03/01/2017   Hyperlipidemia 03/01/2017   Past Medical History:  Diagnosis Date   Arthritis    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 No Known Allergies Prior to Admission medications   Medication Sig Start Date End Date Taking? Authorizing Provider  amLODipine (NORVASC) 2.5 MG tablet Take 1 tablet (2.5 mg total) by mouth daily. 11/18/19  Yes Fulp, Cammie, MD  aspirin EC 81 MG tablet Take 81 mg by mouth every other day. Swallow whole.   Yes [provider]  atorvastatin (LIPITOR) 10 MG tablet TAKE 1 TABLET (10 MG TOTAL) BY MOUTH DAILY AT 6 PM. 01/08/22  Yes Vivi Barrack, MD  azelastine (ASTELIN) 0.1 % nasal spray Place 2 sprays into both nostrils 2 (two) times daily. 10/23/21  Yes Vivi Barrack, MD  baclofen (LIORESAL) 10 MG tablet Take 0.5-1 tablets (5-10 mg total) by mouth 3 (three) times  daily as needed for muscle spasms. 12/06/21  Yes Persons, Bevely Palmer, PA  calcium carbonate (OS-CAL - DOSED IN MG OF ELEMENTAL CALCIUM) 1250 (500 Ca) MG tablet Take 1 tablet by mouth 3 (three) times a week.   Yes [provider]  diclofenac Sodium (VOLTAREN) 1 % GEL  Apply 4 g topically 4 (four) times daily as needed. 05/21/22  Yes Persons, Bevely Palmer, PA  losartan (COZAAR) 50 MG tablet TAKE 1 TABLET BY MOUTH TWICE A DAY 12/23/21  Yes Vivi Barrack, MD  metoprolol tartrate (LOPRESSOR) 25 MG tablet TAKE 1 TABLET BY MOUTH TWICE A DAY 12/23/21  Yes Vivi Barrack, MD  mirtazapine (REMERON) 7.5 MG tablet TAKE 1 TABLET BY MOUTH EVERYDAY AT BEDTIME 03/18/22  Yes Vivi Barrack, MD  Multiple Vitamin (MULTIVITAMIN) tablet Take 1 tablet by mouth 2 (two) times a week.   Yes [provider]  Pyridoxine HCl (VITAMIN B-6 PO) Take by mouth.   Yes [provider]  Thiamine HCl (VITAMIN B-1 PO) Take by mouth.   Yes [provider]  captopril (CAPOTEN) 25 MG tablet TAKE 1 TABLET BY MOUTH EVERY DAY AS NEEDED Patient not taking: Reported on 05/22/2022 03/18/22   Vivi Barrack, MD  cefdinir (OMNICEF) 300 MG capsule Take 1 capsule (300 mg total) by mouth 2 (two) times daily. Patient not taking: Reported on 06/02/2022 03/10/22   Tawnya Crook, MD  hydrochlorothiazide (HYDRODIURIL) 25 MG tablet TAKE 1 TABLET (25 MG TOTAL) BY MOUTH DAILY. 01/08/22 04/08/22  Vivi Barrack, MD  ibuprofen (ADVIL,MOTRIN) 200 MG tablet Take 200 mg by mouth every 6 (six) hours as needed. Patient not taking: Reported on 06/02/2022    [provider]   Social History   Socioeconomic History   Marital status: Widowed    Spouse name: Not on file   Number of children: 2   Years of education: some college   Highest education level: Not on file  Occupational History   Occupation: Retired  Tobacco Use   Smoking status: Never   Smokeless tobacco: Never  Vaping Use   Vaping Use: Never used  Substance and Sexual Activity   Alcohol use: No   Drug use: No   Sexual activity: Not Currently  Other Topics Concern   Not on file  Social History Narrative   Pt lives with her daughter and her daughter's family (husband and 2 children) in 2 story home   Has 2 adult children    Some college education - however credits did not transfer to [place]   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  Did not appear to have respiratory distress and speaking in full sentences.  Responses translated through daughter.  All questions were answered from patient and daughter with understanding of plan expressed.   Assessment and Plan: COVID-19 virus infection Onset yesterday, positive testing today. Typical symptoms with chills, body aches, fatigue.  Some slight dyspnea only with ambulation/activity, not at rest.  No chest pain, no confusion and is drinking fluids, normal urine output.  No red flags on initial history or exam over video.  Initial home treatment discussed with ER precautions given. Treatment discussed including Tylenol for body aches, fever, Mucinex for cough.  Fluids and other symptomatic care. Antiviral treatment discussed including potential risks versus benefits, side effects, and potential risk of rebound COVID as well as management if that were to occur.  Chose to start antiviral.  Full dose given based on last GFR.  Stop statin for 1 week.  Instructions by MyChart.  Follow Up Instructions: As needed with ER precautions discussed.   I discussed the assessment and treatment plan with the patient. The patient was provided an opportunity to ask questions and all were answered. The patient agreed with the plan and demonstrated an understanding of the instructions.   The patient  was advised to call back or seek an in-person evaluation if the symptoms worsen or if the condition fails to improve as anticipated.   Wendie Agreste, MD

## 2022-06-02 NOTE — Patient Instructions (Addendum)
Tylenol for body aches or fever every 4 to 6 hours.  Mucinex for cough.  Drink plenty of fluids - water is best.   Start paxlovid antiviral medicine tonight.  As we discussed there are some potential side effects with that medication including stomach upset or diarrhea and if those are significant may need to stop that medicine.  Let us know if that occurs. Do not take atorvastatin for 1 week.  If any chest pains, confusion, difficulty drinking fluids, or shortness of breath, or other acute worsening, I do recommend evaluation through the emergency room  I hope you feel better soon!  this information is provided from the [facility]. There is a link provided below for more information if needed.   Everyone who has presumed or confirmed COVID-19 should stay home and isolate from other people for at least 5 full days (day 0 is the first day of symptoms or the date of the day of the positive viral test for asymptomatic persons). You can end isolation after 5 full days if you are fever-free for 24 hours without the use of fever-reducing medication and your other symptoms have improved (Loss of taste and smell may persist for weeks or months after recovery and need not delay the end of isolation). You should continue to wear a well-fitting mask around others at home and in public for 5 additional days (day 6 through day 10) after the end of your 5-day isolation period. If you are unable to wear a mask when around others, you should continue to isolate for a full 10 days. Avoid people who have weakened immune systems or are more likely to get very sick from COVID-19, and nursing homes and other high-risk settings, until after at least 10 days.  If you continue to have fever or your other symptoms have not improved after 5 days of isolation, you should wait to end your isolation until you are fever-free for 24 hours without the use of fever-reducing medication and your other symptoms have improved. Continue to wear a  well-fitting mask through day 10.   [email or web address]

## 2022-06-03 ENCOUNTER — Telehealth: Payer: Self-pay | Admitting: Family Medicine

## 2022-06-03 ENCOUNTER — Telehealth: Payer: Self-pay | Admitting: Physical Medicine and Rehabilitation

## 2022-06-03 NOTE — Telephone Encounter (Signed)
Pt's daughter called to reschedule pt's appt. Please call pt's daughter Charissa Bash at [phone]

## 2022-06-03 NOTE — Telephone Encounter (Signed)
Called back and clarified.

## 2022-06-03 NOTE — Telephone Encounter (Signed)
I called rescheduled

## 2022-06-03 NOTE — Telephone Encounter (Signed)
Caller name: Emeline Darling  On DPR? :yes/no: Yes  Call back number: [phone]  Provider they see:  [name] (Dr.Greene prescribe medication)   Reason for call:  Daughter called stating she need more clarification on directions for this  medication (nirmatrelvir/ritonavir EUA 20 x 150 mg &10 x 100 mg tabs) for her mom.

## 2022-06-05 ENCOUNTER — Ambulatory Visit: Payer: Medicare Other | Admitting: Physical Medicine and Rehabilitation

## 2022-06-16 ENCOUNTER — Telehealth: Payer: Self-pay | Admitting: Physical Medicine and Rehabilitation

## 2022-06-16 ENCOUNTER — Encounter: Payer: Self-pay | Admitting: Physical Medicine and Rehabilitation

## 2022-06-16 ENCOUNTER — Ambulatory Visit (INDEPENDENT_AMBULATORY_CARE_PROVIDER_SITE_OTHER): Payer: Medicare Other | Admitting: Physical Medicine and Rehabilitation

## 2022-06-16 VITALS — BP 172/81 | HR 161 | Wt 123.0 lb

## 2022-06-16 DIAGNOSIS — G8929 Other chronic pain: Secondary | ICD-10-CM

## 2022-06-16 DIAGNOSIS — M4726 Other spondylosis with radiculopathy, lumbar region: Secondary | ICD-10-CM

## 2022-06-16 DIAGNOSIS — M5442 Lumbago with sciatica, left side: Secondary | ICD-10-CM

## 2022-06-16 DIAGNOSIS — M5116 Intervertebral disc disorders with radiculopathy, lumbar region: Secondary | ICD-10-CM | POA: Diagnosis not present

## 2022-06-16 DIAGNOSIS — M5416 Radiculopathy, lumbar region: Secondary | ICD-10-CM

## 2022-06-16 DIAGNOSIS — M5441 Lumbago with sciatica, right side: Secondary | ICD-10-CM

## 2022-06-16 NOTE — Telephone Encounter (Signed)
Patient's daughter Elvin So called asked if she cam bring her mother in early around 1:45 pm or 2:00 pm?  The number to contact Elvin So is [phone]

## 2022-06-16 NOTE — Telephone Encounter (Signed)
Appt rescheduled

## 2022-06-16 NOTE — Progress Notes (Unsigned)
Lynn [name] - 81 y.o. female MRN [id number]  Date of birth: [birth year]  Office Visit Note: Visit Date: 06/16/2022 PCP: Vivi Barrack, MD Referred by: Vivi Barrack, MD  Subjective: Chief Complaint  Patient presents with   Lower Back - Pain   HPI: Lynn [name] is a 81 y.o. female who comes in today per the request of Dr. Joni Fears for evaluation of bilateral lower back pain radiating down to legs. Pain ongoing for several years, pain increased 3 months ago. Daughter is interpreting for patient during our visit today. States pain is exacerbated by movement and activity. She describes pain as sore, tingling and aching, currently rates as 7 out of 10. Some relief of pain with home exercise regimen, rest and use of medications. States she is unable to take Ibuprofen as this medication makes her dizzy. She has taken Gabapentin and Celebrex in the past with some relief of pain. Lumbar MRI imaging from 2021 exhibits multilevel disc degeneration, most severe at L4-L5 where there is moderate to severe bilateral foraminal stenosis. There is a small central disc protrusion at L5-S1. No high grade spinal canal stenosis noted. Patient has no history of lumbar injections/surgery. Patient denies focal weakness, numbness and tingling. Patient denies recent trauma or falls.   Incidentally, patient also reports chronic bilateral knee pain. Under the care of Dr. Durward Fortes for bilateral primary osteoarthritis of knees, more severe on left per imaging. Received right knee injection on 05/28/21, significant and sustained relief of pain with this procedure until recently. She is wearing right knee brace today. Daughter states patients knee pain is becoming progressively worse, she is now having difficulty walking up and down stairs.    Review of Systems  Musculoskeletal:  Positive for back pain and joint pain.  Neurological:  Positive for tingling and sensory change. Negative for  focal weakness and weakness.  All other systems reviewed and are negative.  Otherwise per HPI.  Assessment & Plan: Visit Diagnoses:    ICD-10-CM   1. Chronic bilateral low back pain with bilateral sciatica  M54.42 Ambulatory referral to Physical Medicine Rehab   M54.41    G89.29     2. Lumbar radiculopathy  M54.16 Ambulatory referral to Physical Medicine Rehab    3. Intervertebral disc disorders with radiculopathy, lumbar region  M51.16 Ambulatory referral to Physical Medicine Rehab    4. Other spondylosis with radiculopathy, lumbar region  M47.26 Ambulatory referral to Physical Medicine Rehab       Plan: Findings:  1.  Chronic, worsening and severe bilateral lower back pain radiating to both legs.  Patient continues to have severe pain despite good conservative therapy such as home exercise regimen, rest and use of medications.  Patient's clinical presentation and exam are complex, her pain pattern does not fit with a specific dermatome, however there is moderate to severe bilateral foraminal stenosis at the level of L4-L5. Next step is to perform diagnostic and hopefully therapeutic right L5-S1 interlaminar epidural steroid injection under fluoroscopic guidance. She is not currently taking anticoagulants. If good relief with injection we can repeat this procedure infrequently as needed. I did discuss lumbar epidural steroid injection procedure in detail today. I did offer patient pre-procedure Valium, however she declined at this time. Patient instructed to take Tylenol 500 mg TID as needed. No red flag symptoms noted upon exam today.   2. Chronic, worsening and severe bilateral knee pain. If pain persists we recommend follow up with Dr. Durward Fortes for possible injections  and to discuss other treatment options. Unsure if patient would be surgical candidate due to advanced age.     Meds & Orders: No orders of the defined types were placed in this encounter.   Orders Placed This Encounter   Procedures   Ambulatory referral to Physical Medicine Rehab    Follow-up: Return for Right L5-S1 interlaminar epidural steroid injection.   Procedures: No procedures performed      Clinical History: EXAM: MRI LUMBAR SPINE WITHOUT CONTRAST   TECHNIQUE: Multiplanar, multisequence MR imaging of the lumbar spine was performed. No intravenous contrast was administered.   COMPARISON:  Lumbar spine radiographs 01/27/2020   FINDINGS: Segmentation: The lowest fully formed intervertebral disc space is designated L5-S1. L5 is mildly transitional.   Alignment:  Trace retrolisthesis of L1 on L2 and L2 on L3.   Vertebrae: No fracture or suspicious osseous lesion. Scattered small Schmorl's nodes. Degenerative endplate changes throughout the lumbar spine including mild edema.   Conus medullaris and cauda equina: Conus extends to the L1 level. Conus and cauda equina appear normal.   Paraspinal and other soft tissues: Unremarkable.   Disc levels:   Disc desiccation throughout the lumbar spine. Severe disc space narrowing at L1-2 and L4-5 and mild narrowing at L2-3 and L3-4.   T11-12: Minimal disc bulging without stenosis.   T12-L1: Mild disc bulging without stenosis.   L1-2: Left eccentric disc bulging results in mild left neural foraminal stenosis without spinal stenosis.   L2-3: Disc bulging and a small superimposed left foraminal disc protrusion result in mild bilateral lateral recess stenosis and mild left neural foraminal stenosis without significant spinal stenosis.   L3-4: Disc bulging slightly eccentric to the right results in borderline lateral recess stenosis and mild right neural foraminal stenosis without spinal stenosis.   L4-5: Disc bulging, endplate spurring, severe disc space height loss, and mild facet and ligamentum flavum hypertrophy result in moderate to severe bilateral neural foraminal stenosis with potential bilateral L4 nerve root impingement. No  spinal stenosis.   L5-S1: Right eccentric disc bulging and mild right facet hypertrophy result in mild-to-moderate right neural foraminal stenosis. There is a small central disc protrusion without spinal stenosis.   IMPRESSION: 1. 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 2. Mild-to-moderate right neural foraminal stenosis at L5-S1. 3. No significant spinal stenosis.     Electronically Signed   By: Logan Bores M.D.   On: 07/08/2020 15:20   She reports that she has never smoked. She has never used smokeless tobacco.  Recent Labs    12/13/21 1103  HGBA1C 6.3    Objective:  VS:  HT:    WT:123 lb (55.8 kg)  BMI:23.25    BP: (!) 172/81  HR: (!) 161bpm  TEMP: ( )  RESP:  Physical Exam Vitals and nursing note reviewed.  HENT:     Head: Normocephalic and atraumatic.     Right Ear: External ear normal.     Left Ear: External ear normal.     Nose: Nose normal.     Mouth/Throat:     Mouth: Mucous membranes are moist.  Eyes:     Extraocular Movements: Extraocular movements intact.  Cardiovascular:     Rate and Rhythm: Normal rate.     Pulses: Normal pulses.  Pulmonary:     Effort: Pulmonary effort is normal.  Abdominal:     General: Abdomen is flat. There is no distension.  Musculoskeletal:  General: Tenderness present.     Cervical back: Normal range of motion.     Comments: Pt is slow to rises from seated position to standing. Good lumbar range of motion. Strong distal strength without clonus, no pain upon palpation of greater trochanters. Sensation intact bilaterally. Paraesthesias noted to bilateral legs. Walks independently, gait steady.   Skin:    General: Skin is warm and dry.     Capillary Refill: Capillary refill takes less than 2 seconds.  Neurological:     General: No focal deficit present.     Mental Status: She is alert and oriented to person, place, and time.   Psychiatric:        Mood and Affect: Mood normal.        Behavior: Behavior normal.     Ortho Exam  Imaging: No results found.  Past Medical/Family/Surgical/Social History: Medications & Allergies reviewed per EMR, new medications updated. 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 No pertinent surgical history. Social History   Occupational History   Occupation: Retired  Tobacco Use   Smoking status: Never   Smokeless tobacco: Never  Vaping Use   Vaping Use: Never used  Substance and Sexual Activity   Alcohol use: No   Drug use: No   Sexual activity: Not Currently

## 2022-07-02 ENCOUNTER — Ambulatory Visit: Payer: Self-pay

## 2022-07-02 ENCOUNTER — Ambulatory Visit (INDEPENDENT_AMBULATORY_CARE_PROVIDER_SITE_OTHER): Payer: Medicare Other | Admitting: Physical Medicine and Rehabilitation

## 2022-07-02 VITALS — BP 173/78 | HR 60

## 2022-07-02 DIAGNOSIS — M5416 Radiculopathy, lumbar region: Secondary | ICD-10-CM | POA: Diagnosis not present

## 2022-07-02 MED ORDER — METHYLPREDNISOLONE ACETATE 80 MG/ML IJ SUSP
40.0000 mg | Freq: Once | INTRAMUSCULAR | Status: AC
  Administered 2022-07-02: 40 mg

## 2022-07-02 NOTE — Progress Notes (Signed)
Numeric Pain Rating Scale and Functional Assessment Average Pain 5   In the last MONTH (on 0-10 scale) has pain interfered with the following?  1. General activity like being  able to carry out your everyday physical activities such as walking, climbing stairs, carrying groceries, or moving a chair?  Rating(7)   +Driver, -BT- Baby Aspirin, -Dye Allergies.   More pain in mornings and evenings. Pain radiates from bilateral lower back to bilateral legs to the knees. Takes Gabapentin, Ibuprofen and Tylenol for pain

## 2022-07-02 NOTE — Patient Instructions (Signed)

## 2022-07-03 ENCOUNTER — Encounter: Payer: Medicare Other | Admitting: Physical Medicine and Rehabilitation

## 2022-07-14 NOTE — Procedures (Signed)
Lumbar Epidural Steroid Injection - Interlaminar Approach with Fluoroscopic Guidance  Patient: Lynn [name]      Date of Birth: Sep 14, 1941 MRN: [id number] PCP: Vivi Barrack, MD      Visit Date: 07/02/2022   Universal Protocol:     Consent Given By: the patient  Position: PRONE  Additional Comments: Vital signs were monitored before and after the procedure. Patient was prepped and draped in the usual sterile fashion. The correct patient, procedure, and site was verified.   Injection Procedure Details:   Procedure diagnoses: Lumbar radiculopathy [M54.16]   Meds Administered:  Meds ordered this encounter  Medications   methylPREDNISolone acetate (DEPO-MEDROL) injection 40 mg     Laterality: Right  Location/Site:  L5-S1  Needle: 3.5 in., 20 ga. Tuohy  Needle Placement: Paramedian epidural  Findings:   -Comments: Excellent flow of contrast into the epidural space.  Procedure Details: Using a paramedian approach from the side mentioned above, the region overlying the inferior lamina was localized under fluoroscopic visualization and the soft tissues overlying this structure were infiltrated with 4 ml. of 1% Lidocaine without Epinephrine. The Tuohy needle was inserted into the epidural space using a paramedian approach.   The epidural space was localized using loss of resistance along with counter oblique bi-planar fluoroscopic views.  After negative aspirate for air, blood, and CSF, a 2 ml. volume of Isovue-250 was injected into the epidural space and the flow of contrast was observed. Radiographs were obtained for documentation purposes.    The injectate was administered into the level noted above.   Additional Comments:  The patient tolerated the procedure well Dressing: 2 x 2 sterile gauze and Band-Aid    Post-procedure details: Patient was observed during the procedure. Post-procedure instructions were reviewed.  Patient left the clinic in stable  condition.

## 2022-07-14 NOTE — Progress Notes (Signed)
Lynn [name] - 81 y.o. female MRN [id number]  Date of birth: [birth year]  Office Visit Note: Visit Date: 07/02/2022 PCP: Vivi Barrack, MD Referred by: Lorine Bears, NP  Subjective: Chief Complaint  Patient presents with   Lower Back - Pain   HPI:  Lynn [name] is a 81 y.o. female who comes in today at the request of Barnet Pall, FNP for planned Right L5-S1 Lumbar Interlaminar epidural steroid injection with fluoroscopic guidance.  The patient has failed conservative care including home exercise, medications, time and activity modification.  This injection will be diagnostic and hopefully therapeutic.  Please see requesting physician notes for further details and justification.   ROS Otherwise per HPI.  Assessment & Plan: Visit Diagnoses:    ICD-10-CM   1. Lumbar radiculopathy  M54.16 XR C-ARM NO REPORT    Epidural Steroid injection    methylPREDNISolone acetate (DEPO-MEDROL) injection 40 mg      Plan: No additional findings.   Meds & Orders:  Meds ordered this encounter  Medications   methylPREDNISolone acetate (DEPO-MEDROL) injection 40 mg    Orders Placed This Encounter  Procedures   XR C-ARM NO REPORT   Epidural Steroid injection    Follow-up: Return for visit to requesting provider as needed.   Procedures: No procedures performed  Lumbar Epidural Steroid Injection - Interlaminar Approach with Fluoroscopic Guidance  Patient: Lynn [name]      Date of Birth: March 20, 1941 MRN: [id number] PCP: Vivi Barrack, MD      Visit Date: 07/02/2022   Universal Protocol:     Consent Given By: the patient  Position: PRONE  Additional Comments: Vital signs were monitored before and after the procedure. Patient was prepped and draped in the usual sterile fashion. The correct patient, procedure, and site was verified.   Injection Procedure Details:   Procedure diagnoses: Lumbar radiculopathy [M54.16]   Meds  Administered:  Meds ordered this encounter  Medications   methylPREDNISolone acetate (DEPO-MEDROL) injection 40 mg     Laterality: Right  Location/Site:  L5-S1  Needle: 3.5 in., 20 ga. Tuohy  Needle Placement: Paramedian epidural  Findings:   -Comments: Excellent flow of contrast into the epidural space.  Procedure Details: Using a paramedian approach from the side mentioned above, the region overlying the inferior lamina was localized under fluoroscopic visualization and the soft tissues overlying this structure were infiltrated with 4 ml. of 1% Lidocaine without Epinephrine. The Tuohy needle was inserted into the epidural space using a paramedian approach.   The epidural space was localized using loss of resistance along with counter oblique bi-planar fluoroscopic views.  After negative aspirate for air, blood, and CSF, a 2 ml. volume of Isovue-250 was injected into the epidural space and the flow of contrast was observed. Radiographs were obtained for documentation purposes.    The injectate was administered into the level noted above.   Additional Comments:  The patient tolerated the procedure well Dressing: 2 x 2 sterile gauze and Band-Aid    Post-procedure details: Patient was observed during the procedure. Post-procedure instructions were reviewed.  Patient left the clinic in stable condition.   Clinical History: EXAM: MRI LUMBAR SPINE WITHOUT CONTRAST   TECHNIQUE: Multiplanar, multisequence MR imaging of the lumbar spine was performed. No intravenous contrast was administered.   COMPARISON:  Lumbar spine radiographs 01/27/2020   FINDINGS: Segmentation: The lowest fully formed intervertebral disc space is designated L5-S1. L5 is mildly transitional.   Alignment:  Trace retrolisthesis of L1  on L2 and L2 on L3.   Vertebrae: No fracture or suspicious osseous lesion. Scattered small Schmorl's nodes. Degenerative endplate changes throughout the lumbar spine  including mild edema.   Conus medullaris and cauda equina: Conus extends to the L1 level. Conus and cauda equina appear normal.   Paraspinal and other soft tissues: Unremarkable.   Disc levels:   Disc desiccation throughout the lumbar spine. Severe disc space narrowing at L1-2 and L4-5 and mild narrowing at L2-3 and L3-4.   T11-12: Minimal disc bulging without stenosis.   T12-L1: Mild disc bulging without stenosis.   L1-2: Left eccentric disc bulging results in mild left neural foraminal stenosis without spinal stenosis.   L2-3: Disc bulging and a small superimposed left foraminal disc protrusion result in mild bilateral lateral recess stenosis and mild left neural foraminal stenosis without significant spinal stenosis.   L3-4: Disc bulging slightly eccentric to the right results in borderline lateral recess stenosis and mild right neural foraminal stenosis without spinal stenosis.   L4-5: Disc bulging, endplate spurring, severe disc space height loss, and mild facet and ligamentum flavum hypertrophy result in moderate to severe bilateral neural foraminal stenosis with potential bilateral L4 nerve root impingement. No spinal stenosis.   L5-S1: Right eccentric disc bulging and mild right facet hypertrophy result in mild-to-moderate right neural foraminal stenosis. There is a small central disc protrusion without spinal stenosis.   IMPRESSION: 1. 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 2. Mild-to-moderate right neural foraminal stenosis at L5-S1. 3. No significant spinal stenosis.     Electronically Signed   By: Sebastian Ache M.D.   On: 07/08/2020 15:20     Objective:  VS:  HT:    WT:   BMI:     BP:(!) 173/78  HR:60bpm  TEMP: ( )  RESP:  Physical Exam Vitals and nursing note reviewed.  Constitutional:      General: She is not in acute distress.    Appearance: Normal appearance.  She is not ill-appearing.  HENT:     Head: Normocephalic and atraumatic.     Right Ear: External ear normal.     Left Ear: External ear normal.  Eyes:     Extraocular Movements: Extraocular movements intact.  Cardiovascular:     Rate and Rhythm: Normal rate.     Pulses: Normal pulses.  Pulmonary:     Effort: Pulmonary effort is normal. No respiratory distress.  Abdominal:     General: There is no distension.     Palpations: Abdomen is soft.  Musculoskeletal:        General: Tenderness present.     Cervical back: Neck supple.     Right lower leg: No edema.     Left lower leg: No edema.     Comments: Patient has good distal strength with no pain over the greater trochanters.  No clonus or focal weakness.  Skin:    Findings: No erythema, lesion or rash.  Neurological:     General: No focal deficit present.     Mental Status: She is alert and oriented to person, place, and time.     Sensory: No sensory deficit.     Motor: No weakness or abnormal muscle tone.     Coordination: Coordination normal.  Psychiatric:        Mood and Affect: Mood normal.        Behavior: Behavior normal.      Imaging: No  results found.

## 2022-07-18 ENCOUNTER — Encounter: Payer: Self-pay | Admitting: Family Medicine

## 2022-07-18 ENCOUNTER — Ambulatory Visit (INDEPENDENT_AMBULATORY_CARE_PROVIDER_SITE_OTHER): Payer: Medicare Other | Admitting: Family Medicine

## 2022-07-18 VITALS — BP 130/60 | HR 54 | Temp 97.1°F | Ht 61.0 in | Wt 121.8 lb

## 2022-07-18 DIAGNOSIS — F03B0 Unspecified dementia, moderate, without behavioral disturbance, psychotic disturbance, mood disturbance, and anxiety: Secondary | ICD-10-CM

## 2022-07-18 DIAGNOSIS — E559 Vitamin D deficiency, unspecified: Secondary | ICD-10-CM

## 2022-07-18 DIAGNOSIS — I10 Essential (primary) hypertension: Secondary | ICD-10-CM

## 2022-07-18 DIAGNOSIS — R7303 Prediabetes: Secondary | ICD-10-CM | POA: Diagnosis not present

## 2022-07-18 DIAGNOSIS — F419 Anxiety disorder, unspecified: Secondary | ICD-10-CM

## 2022-07-18 DIAGNOSIS — E785 Hyperlipidemia, unspecified: Secondary | ICD-10-CM | POA: Diagnosis not present

## 2022-07-18 DIAGNOSIS — R5383 Other fatigue: Secondary | ICD-10-CM

## 2022-07-18 LAB — URINALYSIS, ROUTINE W REFLEX MICROSCOPIC
Bilirubin Urine: NEGATIVE
Hgb urine dipstick: NEGATIVE
Ketones, ur: NEGATIVE
Nitrite: NEGATIVE
Specific Gravity, Urine: 1.01 (ref 1.000–1.030)
Total Protein, Urine: NEGATIVE
Urine Glucose: NEGATIVE
Urobilinogen, UA: 0.2 (ref 0.0–1.0)
pH: 6.5 (ref 5.0–8.0)

## 2022-07-18 LAB — VITAMIN D 25 HYDROXY (VIT D DEFICIENCY, FRACTURES): VITD: 31.12 ng/mL (ref 30.00–100.00)

## 2022-07-18 LAB — CBC
HCT: 36.5 % (ref 36.0–46.0)
Hemoglobin: 12.1 g/dL (ref 12.0–15.0)
MCHC: 33.2 g/dL (ref 30.0–36.0)
MCV: 89.4 fl (ref 78.0–100.0)
Platelets: 294 10*3/uL (ref 150.0–400.0)
RBC: 4.08 Mil/uL (ref 3.87–5.11)
RDW: 14.1 % (ref 11.5–15.5)
WBC: 8.2 10*3/uL (ref 4.0–10.5)

## 2022-07-18 LAB — COMPREHENSIVE METABOLIC PANEL
ALT: 13 U/L (ref 0–35)
AST: 20 U/L (ref 0–37)
Albumin: 3.9 g/dL (ref 3.5–5.2)
Alkaline Phosphatase: 55 U/L (ref 39–117)
BUN: 16 mg/dL (ref 6–23)
CO2: 30 mEq/L (ref 19–32)
Calcium: 8.8 mg/dL (ref 8.4–10.5)
Chloride: 95 mEq/L — ABNORMAL LOW (ref 96–112)
Creatinine, Ser: 0.73 mg/dL (ref 0.40–1.20)
GFR: 76.99 mL/min (ref >60.00)
Glucose, Bld: 85 mg/dL (ref 70–99)
Potassium: 4.2 mEq/L (ref 3.5–5.1)
Sodium: 130 mEq/L — ABNORMAL LOW (ref 135–145)
Total Bilirubin: 0.6 mg/dL (ref 0.2–1.2)
Total Protein: 6.3 g/dL (ref 6.0–8.3)

## 2022-07-18 LAB — HEMOGLOBIN A1C: Hgb A1c MFr Bld: 6.3 % (ref 4.6–6.5)

## 2022-07-18 LAB — LIPID PANEL
Cholesterol: 143 mg/dL (ref 0–200)
HDL: 60.6 mg/dL (ref >39.00)
LDL Cholesterol: 66 mg/dL (ref 0–99)
NonHDL: 82.42
Total CHOL/HDL Ratio: 2
Triglycerides: 82 mg/dL (ref 0.0–149.0)
VLDL: 16.4 mg/dL (ref 0.0–40.0)

## 2022-07-18 LAB — VITAMIN B12: Vitamin B-12: 446 pg/mL (ref 211–911)

## 2022-07-18 LAB — TSH: TSH: 1.32 u[IU]/mL (ref 0.35–5.50)

## 2022-07-18 MED ORDER — HYDROXYZINE HCL 10 MG PO TABS: 10.0000 mg | ORAL_TABLET | Freq: Three times a day (TID) | ORAL | 0 refills | Status: DC | PRN

## 2022-07-18 NOTE — Patient Instructions (Signed)
It was very nice to see you today!  We will check blood work today.  We will also check a urine sample.  Please use the hydroxyzine as needed for anxiety.  Take care, Dr Jerline Pain  PLEASE NOTE:  If you had any lab tests please let us know if you have not heard back within a few days. You may see your results on mychart before we have a chance to review them but we will give you a call once they are reviewed by [place]. If we ordered any referrals today, please let us know if you have not heard from their office within the next week.   Please try these tips to maintain a healthy lifestyle:  Eat at least 3 REAL meals and 1-2 snacks per day.  Aim for no more than 5 hours between eating.  If you eat breakfast, please do so within one hour of getting up.   Each meal should contain half fruits/vegetables, one quarter protein, and one quarter carbs (no bigger than a computer mouse)  Cut down on sweet beverages. This includes juice, soda, and sweet tea.   Drink at least 1 glass of water with each meal and aim for at least 8 glasses per day  Exercise at least 150 minutes every week.

## 2022-07-18 NOTE — Assessment & Plan Note (Signed)
She has had more brain fog and lethargy recently we will be checking labs as above.  May consider referral to neurology if this continues to be an issue.

## 2022-07-18 NOTE — Assessment & Plan Note (Signed)
Check A1c. 

## 2022-07-18 NOTE — Assessment & Plan Note (Signed)
At goal today on losartan 50 mg twice daily and Metroprolol tartrate 25 mg twice daily.

## 2022-07-18 NOTE — Assessment & Plan Note (Signed)
Patient still with occasional anxiety and anxious symptoms.  Per her daughter this has been a longstanding issue.  She has been prescribed medications in the past however she is not sure if they are available in this country.  Daughter would like to have something to use on hand as needed.  We will send in low-dose hydroxyzine.  They can follow-up with me in a few weeks via MyChart.

## 2022-07-18 NOTE — Assessment & Plan Note (Signed)
Check lipids.  She is on Lipitor 10 mg daily. 

## 2022-07-18 NOTE — Progress Notes (Signed)
   Lynn [name] is a 81 y.o. female who presents today for an office visit.  Assessment/Plan:  New/Acute Problems: Other Fatigue Unclear etiology.  No localizing symptoms.  History is fairly vague and mostly given by the daughter.  Patient does have underlying moderate dementia.  We will check labs and urine sample today to look for any possible causes.  Encouraged good hydration.  It is possible that this could be progression of her underlying dementia however we will look for reversible causes as above.  Chronic Problems Addressed Today: Prediabetes Check A1c  Hypertension At goal today on losartan 50 mg twice daily and Metroprolol tartrate 25 mg twice daily.  Hyperlipidemia Check lipids.  She is on Lipitor 10 mg daily.  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  May consider referral to neurology if this continues to be an issue.  Anxiety Patient still with occasional anxiety and anxious symptoms.  Per her daughter this has been a longstanding issue.  She has been prescribed medications in the past however she is not sure if they are available in this country.  Daughter would like to have something to use on hand as needed.  We will send in low-dose hydroxyzine.  They can follow-up with me in a few weeks via MyChart.     Subjective:  HPI:  Patient here with fatigue and brain fog.  History provided by patient's daughter.  States that she does not seem right.  Her symptoms started a week ago. Seems to be getting worse the last couple of days.  No increased frequency or urgency. No fevers or chills. No cough no runny nose. Daughter has noticed dozing off more. No confusion. Daughter has been trying to make sure she is getting plenty of fluid.  No recent illnesses.  No known sick contacts.  She did take a dose of Remeron yesterday which she thinks may have caused some dizziness.   She had a steroid injection done about a week ago is not sure if this is contributing.       Objective:  Physical Exam: BP 130/60   Pulse (!) 54   Temp (!) 97.1 F (36.2 C) (Temporal)   Ht 5\' 1"  (1.549 m)   Wt 121 lb 12.8 oz (55.2 kg)   SpO2 99%   BMI 23.01 kg/m   Wt Readings from Last 3 Encounters:  07/18/22 121 lb 12.8 oz (55.2 kg)  06/16/22 123 lb (55.8 kg)  05/22/22 123 lb 3.2 oz (55.9 kg)    Gen: No acute distress, resting comfortably HEENT: Left TM with chronic perforation otherwise clear.  Right TM clear. CV: Regular rate and rhythm with no murmurs appreciated Pulm: Normal work of breathing, clear to auscultation bilaterally with no crackles, wheezes, or rhonchi Neuro: Grossly normal, moves all extremities.  Strength 5 out of 5 in upper and lower extremities.  No focal deficits Psych: Normal affect and thought content      General Wearing M. Jerline Pain, MD 07/18/2022 12:45 PM

## 2022-07-20 LAB — URINE CULTURE
MICRO NUMBER:: 14111149
Result:: NO GROWTH
SPECIMEN QUALITY:: ADEQUATE

## 2022-07-21 NOTE — Progress Notes (Signed)
Please inform patient of the following:  Her sodium level is low.  This could explain her symptoms that she has been having.  All of her labs are stable. Her low sodium is probably due to medication side effect.  Recommend she stop taking the losartan and come back in 1-2 weeks to recheck BMET.  They should monitor her blood pressure at home and let us know how her blood pressures are looking over the next couple of weeks.

## 2022-07-23 NOTE — Progress Notes (Signed)
Please have them schedule a follow up appointment to discuss.  Low sodium is more dangerous than the potential that her BP may go up, especially in the short term. Recommend they hold on the losartan per my last note.

## 2022-07-28 ENCOUNTER — Ambulatory Visit (INDEPENDENT_AMBULATORY_CARE_PROVIDER_SITE_OTHER): Payer: Medicare Other | Admitting: Family Medicine

## 2022-07-28 ENCOUNTER — Encounter: Payer: Self-pay | Admitting: Family Medicine

## 2022-07-28 VITALS — BP 127/84 | HR 57 | Temp 97.7°F | Ht 61.0 in | Wt 122.8 lb

## 2022-07-28 DIAGNOSIS — E871 Hypo-osmolality and hyponatremia: Secondary | ICD-10-CM

## 2022-07-28 DIAGNOSIS — R7303 Prediabetes: Secondary | ICD-10-CM

## 2022-07-28 DIAGNOSIS — Z23 Encounter for immunization: Secondary | ICD-10-CM

## 2022-07-28 DIAGNOSIS — I10 Essential (primary) hypertension: Secondary | ICD-10-CM | POA: Diagnosis not present

## 2022-07-28 LAB — BASIC METABOLIC PANEL
BUN: 17 mg/dL (ref 6–23)
CO2: 31 mEq/L (ref 19–32)
Calcium: 9.5 mg/dL (ref 8.4–10.5)
Chloride: 98 mEq/L (ref 96–112)
Creatinine, Ser: 0.95 mg/dL (ref 0.40–1.20)
GFR: 56.11 mL/min — ABNORMAL LOW (ref >60.00)
Glucose, Bld: 102 mg/dL — ABNORMAL HIGH (ref 70–99)
Potassium: 5.3 mEq/L — ABNORMAL HIGH (ref 3.5–5.1)
Sodium: 135 mEq/L (ref 135–145)

## 2022-07-28 NOTE — Assessment & Plan Note (Signed)
Blood pressure is at goal today on losartan 50 mg twice daily metoprolol titrate 25 mg twice daily.  We will be rechecking her be met as above.  He still has persistent hyponatremia we will stop losartan and increase dose of metoprolol or amlodipine.

## 2022-07-28 NOTE — Progress Notes (Signed)
   Lynn [name] is a 81 y.o. female who presents today for an office visit.  Assessment/Plan:  New/Acute Problems: Hyponatremia Recheck be met today.  If sodium is still low we will need to stop losartan as below.  Chronic Problems Addressed Today: Hypertension Blood pressure is at goal today on losartan 50 mg twice daily metoprolol titrate 25 mg twice daily.  We will be rechecking her be met as above.  He still has persistent hyponatremia we will stop losartan and increase dose of metoprolol or amlodipine.  Prediabetes Last ate see stable 6.3.    Subjective:  HPI:  Patinet here today with her daughter for follow-up.  They were seen here about a week and a half ago with fatigue.  We checked labs which did show hyponatremia.  Remainder of her labs were normal.  She has done well the last several days.  No longer having issues with fatigue.  Daughter was reluctant to stop the losartan due to concerns with blood pressure.  She has not made any medication changes since her last visit.       Objective:  Physical Exam: BP 127/84   Pulse (!) 57   Temp 97.7 F (36.5 C) (Temporal)   Ht _0  (1.549 m)   Wt 122 lb 12.8 oz (55.7 kg)   SpO2 96%   BMI 23.20 kg/m   Gen: No acute distress, resting comfortably CV: Regular rate and rhythm with no murmurs appreciated Pulm: Normal work of breathing, clear to auscultation bilaterally with no crackles, wheezes, or rhonchi Neuro: Grossly normal, moves all extremities Psych: Normal affect and thought content      Nicolo Tomko M. Jerline Pain, MD 07/28/2022 11:57 AM

## 2022-07-28 NOTE — Assessment & Plan Note (Signed)
Last ate see stable 6.3.

## 2022-07-28 NOTE — Patient Instructions (Signed)
It was very nice to see you today!  We will check blood work today.  If her sodium is still low we will need to stop the losartan and increase the dose of amlodipine.  Take care, Dr Jerline Pain  PLEASE NOTE:  If you had any lab tests please let us know if you have not heard back within a few days. You may see your results on mychart before we have a chance to review them but we will give you a call once they are reviewed by [place]. If we ordered any referrals today, please let us know if you have not heard from their office within the next week.   Please try these tips to maintain a healthy lifestyle:  Eat at least 3 REAL meals and 1-2 snacks per day.  Aim for no more than 5 hours between eating.  If you eat breakfast, please do so within one hour of getting up.   Each meal should contain half fruits/vegetables, one quarter protein, and one quarter carbs (no bigger than a computer mouse)  Cut down on sweet beverages. This includes juice, soda, and sweet tea.   Drink at least 1 glass of water with each meal and aim for at least 8 glasses per day  Exercise at least 150 minutes every week.

## 2022-07-30 NOTE — Progress Notes (Signed)
Please inform patient of the following:  Sodium levels are back to normal.  Potassium is little bit high but this is likely due to lab error.  It is okay for her to continue with current treatment plan.  Do not need to make any changes at this time.  Would like for her to let us know if symptoms change.

## 2022-08-08 ENCOUNTER — Telehealth: Payer: Self-pay | Admitting: Family Medicine

## 2022-08-08 NOTE — Telephone Encounter (Signed)
Caller States: -Patient recently had Diarrhea but is not longer experiencing it. -patient feels weak and not well. -Caller requested to speak with on call nurse   No appointments available at [facility] sites.   Caller warm transferred to [facility], patient coordinator with PCP Triage / Team Health Nurse.

## 2022-08-08 NOTE — Telephone Encounter (Signed)
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 She has urinated. Translation No Nurse Assessment Nurse: Omar Person, RN, Cristal Deer Date/Time (Eastern Time): 08/08/2022 2:00:58 PM Confirm and document reason for call. If symptomatic, describe symptoms. ---Caller states that her mother is lethargic and feels tired. Has recent hx of illness but no symptoms remain. Denies other symptoms. Does the patient have any new or worsening symptoms? ---Yes Will a triage be completed? ---Yes Related visit to physician within the last 2 weeks? ---Yes Does the PT have any chronic conditions? (i.e. diabetes, asthma, this includes High risk factors for pregnancy, etc.) ---No Is this a behavioral health or substance abuse call? ---No Guidelines Guideline Title Affirmed Question Affirmed Notes Nurse Date/Time (Eastern Time) Weakness (Generalized) and Fatigue [1] MODERATE weakness (i.e., interferes with work, school, normal activities) AND [2] cause unknown (Exceptions: Weakness from acute minor illness Omar Person, Verdon Cummins 08/08/2022 2:03:17 PM  Guidelines Guideline Title Affirmed Question Affirmed Notes Nurse Date/Time (Eastern Time) or poor fluid intake; weakness is chronic and not worse.) Disp. Time Lamount Cohen Time) Disposition  Final User 08/08/2022 2:08:59 PM See HCP within 4 Hours (or PCP triage) Yes Omar Person, RN, Cristal Deer Final Disposition 08/08/2022 2:08:59 PM See HCP within 4 Hours (or PCP triage) Yes Omar Person, RN, Rayetta Pigg Disagree/Comply Disagree Caller Understands Yes PreDisposition Did not know what to do Care Advice Given Per Guideline * UCC: Some UCCs can manage patients who are stable and have less serious symptoms (e.g., minor illnesses and injuries). The triager must know the [facility] capabilities before sending a patient there. If unsure, call ahead. * IF OFFICE WILL BE CLOSED AND NO PCP (PRIMARY CARE PROVIDER) SECOND-LEVEL TRIAGE: You need to be seen within the next 3 or 4 hours. A nearby Urgent Care Center [facility]) is often a good source of care. Another choice is to go to the ED. Go sooner if you become worse. SEE HCP (OR PCP TRIAGE) WITHIN 4 HOURS: BRING MEDICINES: * Please bring a list of your current medicines when you go to see the doctor. CALL BACK IF: * You become worse CARE ADVICE given per Weakness and Fatigue (Adult) guideline. Referrals REFERRED TO PCP OFFICE

## 2022-08-11 NOTE — Telephone Encounter (Signed)
See note

## 2022-09-01 ENCOUNTER — Telehealth: Payer: Self-pay | Admitting: Family Medicine

## 2022-09-01 NOTE — Telephone Encounter (Signed)
Caller states: -On Saturday @ 2:25am pt's BP increased to 180/100 and pt had a headache - She gave pt emergency BP med, captopril 50 mg in total and some lemon juice - By 5 am BP returned to 140/80 - Currently pt is still feeling foggy and hazy  - Patient has continued regular BP regimen of losartan and metoprolol tartrate since episode on Saturday   Patient and caller have been transferred to triage.

## 2022-09-01 NOTE — Telephone Encounter (Signed)
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 States she gave her medication at that time, states she had to give her another dose but by morning it was back to normal. States she still has headache, dizzy, and foggy. Translation No Nurse Assessment Nurse: Zulliger, RN, Eileen Stanford Date/Time (Eastern Time): 09/01/2022 2:54:50 PM Confirm and document reason for call. If symptomatic, describe symptoms. ---Caller states her mom's BP was high on Saturday night. She gave her BP medication. States her mom is having some fogginess and a headache. BP 140/70 was taken two hours ago. Denies any fever or cough. Does the patient have any new or worsening symptoms? ---Yes Will a triage be completed? ---Yes Related visit to physician within the last 2 weeks? ---No Does the PT have any chronic conditions? (i.e. diabetes, asthma, this includes High risk factors for pregnancy, etc.) ---Yes List chronic conditions. ---HTN Is this a behavioral health or substance abuse call? ---No Guidelines Guideline Title Affirmed Question Affirmed Notes Nurse Date/Time (Eastern Time) Blood Pressure - High [1] Taking BP medications AND [2] feels is having Zulliger, RN, Eileen Stanford 09/01/2022 2:58:31 PM   Guidelines Guideline Title Affirmed Question Affirmed Notes Nurse Date/Time  Lamount Cohen Time) side effects (e.g., impotence, cough, dizzy upon standing) Disp. Time Lamount Cohen Time) Disposition Final User 09/01/2022 3:07:31 PM Go to ED Now Yes Zulliger, RN, Eileen Stanford Final Disposition 09/01/2022 3:07:31 PM Go to ED Now Yes Zulliger, RN, Eileen Stanford Disposition Overriden: SEE PCP WITHIN 3 DAYS Override Reason: Patient's symptoms need a higher level of care Caller Disagree/Comply Disagree Caller Understands Yes PreDisposition InappropriateToAsk Care Advice Given Per Guideline GO TO ED NOW: * You need to be seen in the Emergency Department. CARE ADVICE given per High Blood Pressure (Adult) guideline. Comments User: Aletha Halim, RN Date/Time Lamount Cohen Time): 09/01/2022 3:04:38 PM BP recheck: 150/67 User: Aletha Halim, RN Date/Time Lamount Cohen Time): 09/01/2022 3:10:48 PM Daughter states she wants to speak with the office/make an appointment rather than go to the ED. Called backline to the office and updated them. Referrals GO TO FACILITY REFUSED

## 2022-09-01 NOTE — Telephone Encounter (Signed)
Caller states: -She declines to take pt to ED since she did not want to sit there for 6 hours waiting for patient to be helped   I informed caller that there are currently no openings left today and PCP is out of office and not able to recommend any other options. Please advise.

## 2022-09-01 NOTE — Telephone Encounter (Signed)
I reviewed chart as PCP was out of office and was asked if patient was appropriate to be seen in office.  After review of chart, feel as though patient is okay to schedule with PCP as long as BP is currently around 140/70. If rises again and develops new sx, needs to go to the ER in the interim.

## 2022-09-02 ENCOUNTER — Ambulatory Visit (INDEPENDENT_AMBULATORY_CARE_PROVIDER_SITE_OTHER): Payer: Medicare Other | Admitting: Family Medicine

## 2022-09-02 ENCOUNTER — Encounter: Payer: Self-pay | Admitting: Family Medicine

## 2022-09-02 VITALS — BP 134/81 | HR 58 | Temp 98.0°F | Ht 61.0 in | Wt 123.8 lb

## 2022-09-02 DIAGNOSIS — I10 Essential (primary) hypertension: Secondary | ICD-10-CM

## 2022-09-02 DIAGNOSIS — E049 Nontoxic goiter, unspecified: Secondary | ICD-10-CM | POA: Diagnosis not present

## 2022-09-02 NOTE — Progress Notes (Signed)
Lynn [name] is a 81 y.o. female who presents today for an office visit.  History provided by patient and daughter via [place] interpretor.   Assessment/Plan:  New/Acute Problems: Dizziness No red flags.  Reassuring exam today.  Does not currently have any symptoms.  Do not think her blood pressure is contributing significantly to this, though will be managing as below.  Will be checking labs including CBC, c-Met, and TSH.  She did have a mild transient episode of hyponatremia about 6 weeks ago that caused similar symptoms.  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  If symptoms persist despite management and good control blood pressure readings would consider having her follow back up with the ENT for further management of her dizziness.  Chronic Problems Addressed Today: Hypertension Blood pressure has been labile and somewhat difficult to control though it is at goal today.  Patient's daughter is extremely anxious about her blood pressure readings.  Will continue her current regimen now with metoprolol tartrate 25 mg twice daily, losartan 50 mg twice daily, and amlodipine 2.5 mg daily.  Will check labs today.  Reiterated that she should not be taking captopril while she is on lisinopril.  Given her labile readings and daughter's anxiety around her BP readings will place referral to hypertension specialist per family request.   Enlarged thyroid gland Check TSH.     Subjective:  HPI:  Patient here today with her daughter. Concern for blood pressure readings.   A few of days ago she had a sudden onset of feeling of dizziness and brain fog. Some subjective weakness as well. Daughter was concerned about elevated BP readings and checked her BP but got an error reading. Apparently the daughter gave her a dose of captopril 50m. We previously had informed patient and daughter that she should not be taking this anymore due to her being on the  losartan.  However daughter was concerned about her possibly elevated blood pressure reading and gave her dose of this.  She check blood pressure a couple of hours later found that is was in the 180s.  She did give her another dose of captopril and blood pressure improved to the 140s.  Over the last couple days blood pressure has been at goal.  She has not given any of her doses of captopril.  Patient's daughter does recognize that we have previously told her to not give the captopril however she states that she was very anxious during this episode and did not have any other resources.  She states that she has had bad experiences with the ED in the past and did not want to take her mother there.  Patient has had ongoing headache and some brain fog for the last few days.  Still has occasional dizziness.  Daughter is very concerned about her blood pressure readings.  Patient does not report any sort of chest pain or shortness of breath.  No current dizziness.  No weakness or numbness.  No vision changes.        Objective:  Physical Exam: BP 134/81   Pulse (!) 58   Temp 98 F (36.7 C) (Temporal)   Ht _0  (1.549 m)   Wt 123 lb 12.8 oz (56.2 kg)   SpO2 95%   BMI 23.39 kg/m   Gen: No acute distress, resting comfortably HEENT: Left TM with perforation.  Right TM clear.  No signs of infection. CV: Regular rate and rhythm  with no murmurs appreciated Pulm: Normal work of breathing, clear to auscultation bilaterally with no crackles, wheezes, or rhonchi Neuro: CN2-12 intact.  Moves upper and lower extremities spontaneously.  Sensation to light touch intact throughout.   Psych: Normal affect and thought content      Lynn [name] M. Jerline Pain, MD 09/02/2022 3:36 PM

## 2022-09-02 NOTE — Assessment & Plan Note (Signed)
Check TSH 

## 2022-09-02 NOTE — Patient Instructions (Signed)
It was very nice to see you today!  Will check blood work today.  I will refer you to see cardiologist to discuss your blood pressure management.  Take care, Dr Jimmey Ralph  PLEASE NOTE:  If you had any lab tests please let us know if you have not heard back within a few days. You may see your results on mychart before we have a chance to review them but we will give you a call once they are reviewed by [place]. If we ordered any referrals today, please let us know if you have not heard from their office within the next week.   Please try these tips to maintain a healthy lifestyle:  Eat at least 3 REAL meals and 1-2 snacks per day.  Aim for no more than 5 hours between eating.  If you eat breakfast, please do so within one hour of getting up.   Each meal should contain half fruits/vegetables, one quarter protein, and one quarter carbs (no bigger than a computer mouse)  Cut down on sweet beverages. This includes juice, soda, and sweet tea.   Drink at least 1 glass of water with each meal and aim for at least 8 glasses per day  Exercise at least 150 minutes every week.

## 2022-09-02 NOTE — Assessment & Plan Note (Addendum)
Blood pressure has been labile and somewhat difficult to control though it is at goal today.  Patient's daughter is extremely anxious about her blood pressure readings.  Will continue her current regimen now with metoprolol tartrate 25 mg twice daily, losartan 50 mg twice daily, and amlodipine 2.5 mg daily.  Will check labs today.  Reiterated that she should not be taking captopril while she is on lisinopril.  Given her labile readings and daughter's anxiety around her BP readings will place referral to hypertension specialist per family request. We discussed reasons to return to care and seek emergent care.

## 2022-09-03 LAB — COMPREHENSIVE METABOLIC PANEL
ALT: 16 U/L (ref 0–35)
AST: 23 U/L (ref 0–37)
Albumin: 4.1 g/dL (ref 3.5–5.2)
Alkaline Phosphatase: 71 U/L (ref 39–117)
BUN: 18 mg/dL (ref 6–23)
CO2: 32 mEq/L (ref 19–32)
Calcium: 9.6 mg/dL (ref 8.4–10.5)
Chloride: 97 mEq/L (ref 96–112)
Creatinine, Ser: 0.94 mg/dL (ref 0.40–1.20)
GFR: 56.79 mL/min — ABNORMAL LOW (ref >60.00)
Glucose, Bld: 92 mg/dL (ref 70–99)
Potassium: 4.7 mEq/L (ref 3.5–5.1)
Sodium: 137 mEq/L (ref 135–145)
Total Bilirubin: 0.4 mg/dL (ref 0.2–1.2)
Total Protein: 6.5 g/dL (ref 6.0–8.3)

## 2022-09-03 LAB — CBC
HCT: 36.7 % (ref 36.0–46.0)
Hemoglobin: 12.3 g/dL (ref 12.0–15.0)
MCHC: 33.4 g/dL (ref 30.0–36.0)
MCV: 89.9 fl (ref 78.0–100.0)
Platelets: 261 10*3/uL (ref 150.0–400.0)
RBC: 4.08 Mil/uL (ref 3.87–5.11)
RDW: 13.7 % (ref 11.5–15.5)
WBC: 7.6 10*3/uL (ref 4.0–10.5)

## 2022-09-03 LAB — TSH: TSH: 2.3 u[IU]/mL (ref 0.35–5.50)

## 2022-09-04 NOTE — Progress Notes (Signed)
Please inform patient of the following:  Labs are all normal.  She still having issues with dizziness recommend follow-up with ENT.    Katina Degree. Jimmey Ralph, MD 09/04/2022 12:38 PM

## 2022-09-08 ENCOUNTER — Ambulatory Visit: Payer: Medicare Other | Attending: Physician Assistant | Admitting: Physician Assistant

## 2022-09-08 ENCOUNTER — Encounter: Payer: Self-pay | Admitting: Physician Assistant

## 2022-09-08 VITALS — BP 150/70 | HR 58 | Ht 61.0 in | Wt 123.0 lb

## 2022-09-08 DIAGNOSIS — R001 Bradycardia, unspecified: Secondary | ICD-10-CM

## 2022-09-08 DIAGNOSIS — I10 Essential (primary) hypertension: Secondary | ICD-10-CM

## 2022-09-08 DIAGNOSIS — F419 Anxiety disorder, unspecified: Secondary | ICD-10-CM

## 2022-09-08 MED ORDER — VALSARTAN-HYDROCHLOROTHIAZIDE 160-12.5 MG PO TABS: 1.0000 | ORAL_TABLET | Freq: Two times a day (BID) | ORAL | 3 refills | Status: DC

## 2022-09-08 NOTE — Progress Notes (Signed)
Office Visit    Patient Name: Lynn [name] Date of Encounter: 09/08/2022  PCP:  Ardith Dark, MD    [facility]  Cardiologist:  Donato Schultz, MD  Advanced Practice Provider:  No care team member to display Electrophysiologist:  None    HPI    Lynn [name] is a 81 y.o. female with a past medical history significant for hypertension last see by Dr. Anne Fu in 2020 presents today for overdue follow-up appointment.   In review of prior notes, she has hypertension anxiety and moderate cognitive impairment and continues to have issues with BP usually at night. She was on captopril in the past. She had been compliant with metoprolol and losartan. Symptoms of headache with hypertension. She was in the ED 5 times a year with headache and elevated BP.   Today, she has her daughter with her and is helping with interrupting. She states that Saturday night her mom was in the bathroom and felt foggy, dizzy and her BP was too high to read on the blood pressure cuff. This week she has been much better but still not feeling right. She also had a headache when her blood pressure was too high. Her daughter gave her a captopril on top of her current medications and then another one an hour later. It did eventually bring it down. We discussed a medication change so she would not need to take the captopril. She also tells me she takes the Norvasc as needed as well and does not want to take this everyday.   Reports no shortness of breath nor dyspnea on exertion. Reports no chest pain, pressure, or tightness. No edema, orthopnea, PND. Reports no palpitations.   Past Medical History    Past Medical History:  Diagnosis Date   Arthritis    Bradycardia 03/01/2017   Edema 03/01/2017   Enlarged thyroid gland 03/01/2017   Hyperlipidemia 03/01/2017   Hypertension    Memory loss    Mild cognitive disorder 06/14/2018   No past surgical history on  file.  Allergies  No Known Allergies   EKGs/Labs/Other Studies Reviewed:   The following studies were reviewed today:  Stress test 09/06/20 Nuclear stress EF: 78%. The left ventricular ejection fraction is hyperdynamic (>65%). This is a low risk study. There is no evidence of ischemia and no evidence of previous infarction. The study is normal.  EKG:  EKG is not ordered today.   Recent Labs: 09/02/2022: ALT 16; BUN 18; Creatinine, Ser 0.94; Hemoglobin 12.3; Platelets 261.0; Potassium 4.7; Sodium 137; TSH 2.30  Recent Lipid Panel    Component Value Date/Time   CHOL 143 07/18/2022 1230   CHOL 119 12/02/2019 0922   TRIG 82.0 07/18/2022 1230   HDL 60.60 07/18/2022 1230   HDL 50 12/02/2019 0922   CHOLHDL 2 07/18/2022 1230   VLDL 16.4 07/18/2022 1230   LDLCALC 66 07/18/2022 1230   LDLCALC 53 12/02/2019 0922    Home Medications   Current Meds  Medication Sig   amLODipine (NORVASC) 2.5 MG tablet Take 1 tablet (2.5 mg total) by mouth daily.   aspirin EC 81 MG tablet Take 81 mg by mouth every other day. Swallow whole.   atorvastatin (LIPITOR) 10 MG tablet TAKE 1 TABLET (10 MG TOTAL) BY MOUTH DAILY AT 6 PM.   azelastine (ASTELIN) 0.1 % nasal spray Place 2 sprays into both nostrils 2 (two) times daily.   baclofen (LIORESAL) 10 MG tablet Take 0.5-1 tablets (5-10 mg total)  by mouth 3 (three) times daily as needed for muscle spasms.   calcium carbonate (OS-CAL - DOSED IN MG OF ELEMENTAL CALCIUM) 1250 (500 Ca) MG tablet Take 1 tablet by mouth 3 (three) times a week.   diclofenac Sodium (VOLTAREN) 1 % GEL Apply 4 g topically 4 (four) times daily as needed.   hydrOXYzine (ATARAX) 10 MG tablet Take 1 tablet (10 mg total) by mouth 3 (three) times daily as needed for anxiety.   ibuprofen (ADVIL,MOTRIN) 200 MG tablet Take 200 mg by mouth every 6 (six) hours as needed.   metoprolol tartrate (LOPRESSOR) 25 MG tablet TAKE 1 TABLET BY MOUTH TWICE A DAY   Multiple Vitamin (MULTIVITAMIN) tablet  Take 1 tablet by mouth 2 (two) times a week.   Pyridoxine HCl (VITAMIN B-6 PO) Take by mouth.   Thiamine HCl (VITAMIN B-1 PO) Take by mouth.   valsartan-hydrochlorothiazide (DIOVAN HCT) 160-12.5 MG tablet Take 1 tablet by mouth in the morning and at bedtime.   [DISCONTINUED] captopril (CAPOTEN) 25 MG tablet Take 25 mg by mouth as needed.   [DISCONTINUED] losartan (COZAAR) 50 MG tablet TAKE 1 TABLET BY MOUTH TWICE A DAY     Review of Systems      All other systems reviewed and are otherwise negative except as noted above.  Physical Exam    VS:  BP (!) 150/70 (BP Location: Left Arm, Patient Position: Sitting, Cuff Size: Normal)   Pulse (!) 58   Ht 5\' 1"  (1.549 m)   Wt 123 lb (55.8 kg)   BMI 23.24 kg/m  , BMI Body mass index is 23.24 kg/m.  Wt Readings from Last 3 Encounters:  09/08/22 123 lb (55.8 kg)  09/02/22 123 lb 12.8 oz (56.2 kg)  07/28/22 122 lb 12.8 oz (55.7 kg)     GEN: Well nourished, well developed, in no acute distress. HEENT: normal. Neck: Supple, no JVD, carotid bruits, or masses. Cardiac: RRR, no murmurs, rubs, or gallops. No clubbing, cyanosis, edema.  Radials/PT 2+ and equal bilaterally.  Respiratory:  Respirations regular and unlabored, clear to auscultation bilaterally. GI: Soft, nontender, nondistended. MS: No deformity or atrophy. Skin: Warm and dry, no rash. Neuro:  Strength and sensation are intact. Psych: Normal affect.  Assessment & Plan    Hypertension -renal artery 13/06/23 -change losartan 50mg  BID to valsartan 160mg  BID + HCTZ 12.5mg  -She is encouraged not to take captopril on top of this regimen which her pcp echoed -keep track of BP an hour after morning and evening medications  -BMP and nurse visit in two weeks  Bradycardia -She is in the 60s most of the time. Today pulse is 58 bpm -Asymptomatic -continue low dose BB  Anxiety -She is on PRN Atarax    Nursing BP check 2 weeks Disposition: Follow up 3 months with [place], MD or  APP.  Signed, , PA-C 09/08/2022, 4:48 PM [facility]

## 2022-09-08 NOTE — Addendum Note (Signed)
Addended by: Valrie Hart on: 09/08/2022 04:55 PM   Modules accepted: Orders

## 2022-09-08 NOTE — Patient Instructions (Addendum)
Medication Instructions:  1.Stop losartan 2.Do not take the captopril that you have 3.Start valsartan-hydrochlorothiazide 160-12.5 mg twice a day *If you need a refill on your cardiac medications before your next appointment, please call your pharmacy*   Lab Work: None If you have labs (blood work) drawn today and your tests are completely normal, you will receive your results only by: MyChart Message (if you have MyChart) OR A paper copy in the mail If you have any lab test that is abnormal or we need to change your treatment, we will call you to review the results.   Testing/Procedures: Your physician has requested that you have a renal artery duplex. During this test, an ultrasound is used to evaluate blood flow to the kidneys. Allow one hour for this exam. Do not eat after midnight the day before and avoid carbonated beverages. Take your medications as you usually do.    Follow-Up: At [facility], you and your health needs are our priority.  As part of our continuing mission to provide you with exceptional heart care, we have created designated Provider Care Teams.  These Care Teams include your primary Cardiologist (physician) and Advanced Practice Providers (APPs -  Physician Assistants and Nurse Practitioners) who all work together to provide you with the care you need, when you need it.   Your next appointment:   Schedule a 2 week nurse visit for a blood pressure check  Then  3 month(s)  The format for your next appointment:   In Person  Provider:   Donato Schultz, MD  or Jari Favre, PA-C       Other Instructions Check your blood pressure one hour after taking your morning medications and one hour after taking your evening medications and keep a log to bring with your to your nurse visit.  Important Information About Sugar

## 2022-09-11 ENCOUNTER — Other Ambulatory Visit: Payer: Self-pay | Admitting: Physician Assistant

## 2022-09-11 ENCOUNTER — Telehealth: Payer: Self-pay | Admitting: Physician Assistant

## 2022-09-11 NOTE — Telephone Encounter (Signed)
Pt c/o medication issue:  1. Name of Medication:  valsartan-hydrochlorothiazide (DIOVAN HCT) 160-12.5 MG tablet   2. How are you currently taking this medication (dosage and times per day)? Not currently taking  3. Are you having a reaction (difficulty breathing--STAT)? No  4. What is your medication issue? Patient's daughter is calling stating the pharmacy advised PA is needed before this medication can be picked up. Daughter would like a callback updating her when PA is submitted. Please advise.

## 2022-09-11 NOTE — Telephone Encounter (Signed)
**Note De-Identified  Obfuscation** Lynn [name] (Key: [id number]) PA Case ID #: [id number] Outcome: Approved today Prior Auth;Coverage Start Date:08/12/2022;Coverage End Date:09/11/2023; Authorization Expiration Date: 09/10/2023 Drug Valsartan-hydroCHLOROthiazide 160-12.5MG  tablets ePA cloud logo Form [employer] [facility] Medicare Electronic PA Form [phone] NCPDP)  I have notified [id number] Ginette Otto, [place] - [address] (Ph: [phone]) and the pts daughter of this approval.

## 2022-09-13 ENCOUNTER — Other Ambulatory Visit: Payer: Self-pay | Admitting: Family Medicine

## 2022-09-19 ENCOUNTER — Telehealth: Payer: Self-pay | Admitting: Cardiology

## 2022-09-19 NOTE — Telephone Encounter (Signed)
Patient's daughter is requesting to reschedule 09/23/22 nurse visit. She states she is unable to make it on that day and would like to move it out a few weeks.

## 2022-09-23 ENCOUNTER — Ambulatory Visit: Payer: Medicare Other

## 2022-09-23 ENCOUNTER — Telehealth: Payer: Self-pay

## 2022-09-23 NOTE — Telephone Encounter (Signed)
Daughter calling back regarding not being contacted to reschedule nurse visit. Labs and appt were cancelled due to the appts being for today. Would like to reschedule for sometime next week. Scheduling is unable to reschedule nurse visits, so a callback from nursing staff is needed. Please advise.

## 2022-09-23 NOTE — Telephone Encounter (Signed)
Left message for Pt to call me back regarding, rescheduling her RN visit for BP check.    Will advise Ms. Nicholes Rough, PA-C, and make her aware of Pt's cancellation.

## 2022-09-23 NOTE — Telephone Encounter (Signed)
Varney Daily, RN  to Elgie Collard, [place]      09/23/22 11:56 AM FYI: Pt cancelled RN Visit / BP check scheduled for today.  I called her back, currently working on rescheduling this BP check. Varney Daily, RN      09/23/22 11:55 AM Note Left message for Pt to call me back regarding, rescheduling her RN visit for BP check.     Will advise Ms. Nicholes Rough, PA-C, and make her aware of Pt's cancellation.

## 2022-09-23 NOTE — Telephone Encounter (Signed)
Message left X2 for pt daughter to call me back, to schedule f/u RN Visit / BP check.  Follow up still required.

## 2022-09-24 NOTE — Telephone Encounter (Signed)
Pt is returning call. Requesting return call.  

## 2022-09-24 NOTE — Telephone Encounter (Signed)
Pt should not be taking losartan, valsartan, and captopril as it would be duplicate therapy, she should only be taking 1 of these. She currently has valsartan-HCTZ on her med list. Per PA appt on 09/08/22:  "-change losartan 50mg  BID to valsartan 160mg  BID + HCTZ 12.5mg  -She is encouraged not to take captopril on top of this regimen which her pcp echoed"  Can clarify if she is having issues since starting valsartan. Captopril is also for HTN, not anxiety, I can't see why this was discontinued. Would be reasonable to change back to ACEi if pt tolerated better. She can also discuss at her upcoming appt on 1/10.

## 2022-09-24 NOTE — Telephone Encounter (Addendum)
Returned patient's daughter's call. She does not want patient to have a nurse visit, she wants patient to see a doctor. Dr. Marlou Porch next available is next Wednesday. Scheduled patient at that time. She stated that her mom is unable to tolerate Losartan and Diovan. She wants to know if there is something her mother can take that is compatible with captopril. Patient stated her mother has to have the captopril that it helps with her anxiety. Will send message to Dr. Marlou Porch and our pharm D for advisement.

## 2022-09-24 NOTE — Telephone Encounter (Signed)
Called daughter and told her what the PharmD had advised. Patient's daughter stated patient's BP is high no matter what she is taking. She stated that her head feels heavy. Patient's daughter wants patient to take something that is compatible with captopril. BP 180/80's on either medications. Will send to Provider for advisement.

## 2022-09-26 ENCOUNTER — Ambulatory Visit (HOSPITAL_BASED_OUTPATIENT_CLINIC_OR_DEPARTMENT_OTHER)
Admission: RE | Admit: 2022-09-26 | Discharge: 2022-09-26 | Disposition: A | Discharge status: Home / Self Care | Payer: Medicare Other | Source: Ambulatory Visit | Attending: Physician Assistant | Admitting: Physician Assistant

## 2022-09-26 ENCOUNTER — Emergency Department (HOSPITAL_COMMUNITY): Payer: Medicare Other

## 2022-09-26 ENCOUNTER — Encounter (HOSPITAL_COMMUNITY): Payer: Self-pay

## 2022-09-26 ENCOUNTER — Emergency Department (HOSPITAL_COMMUNITY)
Admission: EM | Admit: 2022-09-26 | Discharge: 2022-09-26 | Disposition: A | Discharge status: Home / Self Care | Payer: Medicare Other | Attending: Emergency Medicine | Admitting: Emergency Medicine

## 2022-09-26 ENCOUNTER — Other Ambulatory Visit: Payer: Self-pay

## 2022-09-26 DIAGNOSIS — I10 Essential (primary) hypertension: Secondary | ICD-10-CM | POA: Insufficient documentation

## 2022-09-26 DIAGNOSIS — R42 Dizziness and giddiness: Secondary | ICD-10-CM | POA: Diagnosis present

## 2022-09-26 DIAGNOSIS — Z79899 Other long term (current) drug therapy: Secondary | ICD-10-CM | POA: Insufficient documentation

## 2022-09-26 DIAGNOSIS — Z7982 Long term (current) use of aspirin: Secondary | ICD-10-CM | POA: Diagnosis not present

## 2022-09-26 LAB — CBC WITH DIFFERENTIAL/PLATELET
Abs Immature Granulocytes: 0.05 10*3/uL (ref 0.00–0.07)
Basophils Absolute: 0.1 10*3/uL (ref 0.0–0.1)
Basophils Relative: 1 %
Eosinophils Absolute: 0.1 10*3/uL (ref 0.0–0.5)
Eosinophils Relative: 1 %
HCT: 38 % (ref 36.0–46.0)
Hemoglobin: 12.7 g/dL (ref 12.0–15.0)
Immature Granulocytes: 1 %
Lymphocytes Relative: 24 %
Lymphs Abs: 2.2 10*3/uL (ref 0.7–4.0)
MCH: 29.5 pg (ref 26.0–34.0)
MCHC: 33.4 g/dL (ref 30.0–36.0)
MCV: 88.2 fL (ref 80.0–100.0)
Monocytes Absolute: 0.5 10*3/uL (ref 0.1–1.0)
Monocytes Relative: 6 %
Neutro Abs: 6.1 10*3/uL (ref 1.7–7.7)
Neutrophils Relative %: 67 %
Platelets: 343 10*3/uL (ref 150–400)
RBC: 4.31 MIL/uL (ref 3.87–5.11)
RDW: 12.1 % (ref 11.5–15.5)
WBC: 9 10*3/uL (ref 4.0–10.5)
nRBC: 0 % (ref 0.0–0.2)

## 2022-09-26 LAB — BASIC METABOLIC PANEL
Anion gap: 9 (ref 5–15)
BUN: 16 mg/dL (ref 8–23)
CO2: 28 mmol/L (ref 22–32)
Calcium: 9 mg/dL (ref 8.9–10.3)
Chloride: 92 mmol/L — ABNORMAL LOW (ref 98–111)
Creatinine, Ser: 0.82 mg/dL (ref 0.44–1.00)
GFR, Estimated: >60 mL/min (ref >60)
Glucose, Bld: 134 mg/dL — ABNORMAL HIGH (ref 70–99)
Potassium: 3.4 mmol/L — ABNORMAL LOW (ref 3.5–5.1)
Sodium: 129 mmol/L — ABNORMAL LOW (ref 135–145)

## 2022-09-26 MED ORDER — SODIUM CHLORIDE 0.9 % IV BOLUS
1000.0000 mL | Freq: Once | INTRAVENOUS | Status: AC
  Administered 2022-09-26: 1000 mL via INTRAVENOUS

## 2022-09-26 MED ORDER — MECLIZINE HCL 25 MG PO TABS: 25.0000 mg | ORAL_TABLET | Freq: Three times a day (TID) | ORAL | 0 refills | Status: AC | PRN

## 2022-09-26 MED ORDER — DEXAMETHASONE 4 MG PO TABS
10.0000 mg | ORAL_TABLET | Freq: Once | ORAL | Status: AC
  Administered 2022-09-26: 10 mg via ORAL
  Filled 2022-09-26: qty 3

## 2022-09-26 NOTE — ED Provider Triage Note (Signed)
Emergency Medicine Provider Triage Evaluation Note  Lynn [name] , a 82 y.o. female  was evaluated in triage.  Pt complains of high blood pressure, near syncopal episode.  Daughter is with patient who is primary historian.  Daughter states that patient was fasting for renal ultrasound earlier today and had an episode where she stood up, looked pale and felt as if she is in a pass out.  Symptoms lasted for a matter of seconds before patient sat down to return to baseline.  Daughter states that she has been trying to manage at home blood pressure medicines and has been changing between valsartan, captopril and amlodipine intermittently as she has noticed blood pressure being elevated.  Seems to be noncompliant with PCP/cardiologist recommendations of antihypertensive medications.  Patient currently complaining of dizziness worsened with positional changes as well as "foggy headedness" of which patient seems to be relating to elevated blood pressure.  Denies weakness/sensory deficits, slurred speech, facial droop, gait abnormalities.  Review of Systems  Positive: See above Negative:   Physical Exam  There were no vitals taken for this visit. Gen:   Awake, no distress   Resp:  Normal effort  MSK:   Moves extremities without difficulty  Other:  Cranial nerves III through XII grossly intact.  Patient ambulated in the room without difficulty.  Medical Decision Making  Medically screening exam initiated at 3:31 PM.  Appropriate orders placed.  Lynn [name] was informed that the remainder of the evaluation will be completed by another provider, this initial triage assessment does not replace that evaluation, and the importance of remaining in the ED until their evaluation is complete.  Recommend interpreter if able and patient is willing   Lynn [name], [place] 09/26/22 1535

## 2022-09-26 NOTE — ED Provider Notes (Signed)
[facility] EMERGENCY DEPARTMENT Provider Note   CSN: [id number] Arrival date & time: 09/26/22  1437     History  No chief complaint on file.   Lynn [name] is a 82 y.o. female.  82 yo F with a chief complaints of feeling lightheaded.  This been going on for couple weeks.  Is felt very unsteady with standing and ambulating.  The family thinks that this is due to her blood pressure medications.  They have been changing her blood pressure medications fairly rapidly based on what her symptoms are.  It sounds like she was on an ACE inhibitor and an ARB simultaneously because they felt like that controlled it better than what the doctor had prescribed.  Is also been taking what sounds like amlodipine off and on.  Seems to be worse when she stands up and tries to ambulate.  Was sick a couple months ago but has had some ongoing congestion.  No ear pain.  No headache but feels a bit uncomfortable.        Home Medications Prior to Admission medications   Medication Sig Start Date End Date Taking? Authorizing Provider  meclizine (ANTIVERT) 25 MG tablet Take 1 tablet (25 mg total) by mouth 3 (three) times daily as needed for dizziness. 09/26/22  Yes Melene Plan, DO  amLODipine (NORVASC) 2.5 MG tablet Take 1 tablet (2.5 mg total) by mouth daily. 11/18/19   Fulp, Cammie, MD  aspirin EC 81 MG tablet Take 81 mg by mouth every other day. Swallow whole.    [provider]  atorvastatin (LIPITOR) 10 MG tablet TAKE 1 TABLET (10 MG TOTAL) BY MOUTH DAILY AT 6 PM. 01/08/22   Ardith Dark, MD  azelastine (ASTELIN) 0.1 % nasal spray Place 2 sprays into both nostrils 2 (two) times daily. 10/23/21   Ardith Dark, MD  baclofen (LIORESAL) 10 MG tablet Take 0.5-1 tablets (5-10 mg total) by mouth 3 (three) times daily as needed for muscle spasms. 12/06/21   Persons, [place], PA  calcium carbonate (OS-CAL - DOSED IN MG OF ELEMENTAL CALCIUM) 1250 (500 Ca) MG tablet Take 1 tablet  by mouth 3 (three) times a week.    [provider]  diclofenac Sodium (VOLTAREN) 1 % GEL Apply 4 g topically 4 (four) times daily as needed. 05/21/22   Persons, [place], PA  hydrOXYzine (ATARAX) 10 MG tablet Take 1 tablet (10 mg total) by mouth 3 (three) times daily as needed for anxiety. 07/18/22   Ardith Dark, MD  ibuprofen (ADVIL,MOTRIN) 200 MG tablet Take 200 mg by mouth every 6 (six) hours as needed.    [provider]  metoprolol tartrate (LOPRESSOR) 25 MG tablet TAKE 1 TABLET BY MOUTH TWICE A DAY 12/23/21   Ardith Dark, MD  Multiple Vitamin (MULTIVITAMIN) tablet Take 1 tablet by mouth 2 (two) times a week.    [provider]  Pyridoxine HCl (VITAMIN B-6 PO) Take by mouth.    [provider]  Thiamine HCl (VITAMIN B-1 PO) Take by mouth.    [provider]  valsartan-hydrochlorothiazide (DIOVAN HCT) 160-12.5 MG tablet Take 1 tablet by mouth in the morning and at bedtime. 09/08/22   Sharlene Dory, PA-C      Allergies    Patient has no known allergies.    Review of Systems   Review of Systems  Physical Exam Updated Vital Signs BP (!) 139/53   Pulse 63   Temp 97.9 F (  36.6 C)   Resp 13   SpO2 98%  Physical Exam Vitals and nursing note reviewed.  Constitutional:      General: She is not in acute distress.    Appearance: She is well-developed. She is not diaphoretic.  HENT:     Head: Normocephalic and atraumatic.  Eyes:     Pupils: Pupils are equal, round, and reactive to light.  Cardiovascular:     Rate and Rhythm: Normal rate and regular rhythm.     Heart sounds: No murmur heard.    No friction rub. No gallop.  Pulmonary:     Effort: Pulmonary effort is normal.     Breath sounds: No wheezing or rales.  Abdominal:     General: There is no distension.     Palpations: Abdomen is soft.     Tenderness: There is no abdominal tenderness.  Musculoskeletal:        General: No tenderness.     Cervical back: Normal range of  motion and neck supple.  Skin:    General: Skin is warm and dry.  Neurological:     Mental Status: She is alert and oriented to person, place, and time.     Cranial Nerves: Cranial nerves 2-12 are intact.     Sensory: Sensation is intact.     Motor: Motor function is intact.     Coordination: Coordination is intact.     Comments: No obvious neurologic deficit.  No truncal ataxia.  Psychiatric:        Behavior: Behavior normal.     ED Results / Procedures / Treatments   Labs (all labs ordered are listed, but only abnormal results are displayed) Labs Reviewed  BASIC METABOLIC PANEL - Abnormal; Notable for the following components:      Result Value   Sodium 129 (*)    Potassium 3.4 (*)    Chloride 92 (*)    Glucose, Bld 134 (*)    All other components within normal limits  CBC WITH DIFFERENTIAL/PLATELET    EKG EKG Interpretation  Date/Time:  Friday September 26 2022 15:43:29 EST Ventricular Rate:  59 PR Interval:  176 QRS Duration: 84 QT Interval:  468 QTC Calculation: 463 R Axis:   78 Text Interpretation: Sinus bradycardia Otherwise normal ECG No significant change since last tracing Confirmed by Deno Etienne [phone]) on 09/26/2022 7:28:00 PM  Radiology MR BRAIN WO CONTRAST  Result Date: 09/26/2022 CLINICAL DATA:  Delirium, high blood pressure, dizziness EXAM: MRI HEAD WITHOUT CONTRAST TECHNIQUE: Multiplanar, multiecho pulse sequences of the brain and surrounding structures were obtained without intravenous contrast. COMPARISON:  No prior MRI, correlation is made with CT head 09/26/2022 FINDINGS: Brain: No restricted diffusion to suggest acute or subacute infarct.No acute hemorrhage, mass, mass effect, or midline shift. No hydrocephalus or extra-axial collection.No hemosiderin deposition to suggest remote hemorrhage.Normal pituitary and craniocervical junction.Mildly advanced central cerebral volume loss for age. T2 hyperintense signal in the periventricular white matter and pons,  likely the sequela of mild-to-moderate chronic small vessel ischemic disease. Vascular: Normal arterial flow voids. Skull and upper cervical spine: Normal marrow signal. Sinuses/Orbits: Mucous retention cyst in the left maxillary sinus. Mild mucosal thickening in the right anterior ethmoid air cells.Status post right lens replacement. Other: None. IMPRESSION: No acute intracranial process. No evidence of acute or subacute infarct. Electronically Signed   By: Merilyn Baba M.D.   On: 09/26/2022 21:41   VAS US RENAL ARTERY DUPLEX  Result Date: 09/26/2022 ABDOMINAL VISCERAL Patient Name:  [employer]  Domenic Polite  Date of Exam:   09/26/2022 Medical Rec #: [id number]                  Accession #:    [id number] Date of Birth: [birth year]                  Patient Gender: F Patient Age:   58 years Exam Location:  [facility] Procedure:      VAS US RENAL ARTERY DUPLEX Referring Phys: [id number] TESSA N CONTE -------------------------------------------------------------------------------- Indications: Essential hypertension. Patient denies any abdominal pain. High Risk Factors: Hypertension, hyperlipidemia, no history of smoking. Comparison Study: NA Performing Technologist: Tyna Jaksch RVT  Examination Guidelines: A complete evaluation includes B-mode imaging, spectral Doppler, color Doppler, and power Doppler as needed of all accessible portions of each vessel. Bilateral testing is considered an integral part of a complete examination. Limited examinations for reoccurring indications may be performed as noted.  Duplex Findings: +--------------------+--------+--------+------+----------------------+ Mesenteric          PSV cm/sEDV cm/sPlaque       Comments        +--------------------+--------+--------+------+----------------------+ Aorta Prox             66      66         2.3 cm AP x 2.4 cm TRV +--------------------+--------+--------+------+----------------------+ Aorta Distal           83      83                                 +--------------------+--------+--------+------+----------------------+ Celiac Artery Origin   67                                        +--------------------+--------+--------+------+----------------------+ SMA Proximal           92      11                                +--------------------+--------+--------+------+----------------------+    +------------------+--------+--------+-------+ Right Renal ArteryPSV cm/sEDV cm/sComment +------------------+--------+--------+-------+ Origin              101      20           +------------------+--------+--------+-------+ Proximal            137      39           +------------------+--------+--------+-------+ Mid                 139      30           +------------------+--------+--------+-------+ Distal              115      24           +------------------+--------+--------+-------+ +-----------------+--------+--------+-------+ Left Renal ArteryPSV cm/sEDV cm/sComment +-----------------+--------+--------+-------+ Origin             102      25           +-----------------+--------+--------+-------+ Proximal           140      27           +-----------------+--------+--------+-------+ Mid                122  20           +-----------------+--------+--------+-------+ Distal             117      23           +-----------------+--------+--------+-------+ +------------+--------+--------+----+-----------+--------+--------+----+ Right KidneyPSV cm/sEDV cm/sRI  Left KidneyPSV cm/sEDV cm/sRI   +------------+--------+--------+----+-----------+--------+--------+----+ Upper Pole  26      6       0.77Upper Pole 26      6       0.77 +------------+--------+--------+----+-----------+--------+--------+----+ Mid         43      8       0.82Mid        42      6       0.85 +------------+--------+--------+----+-----------+--------+--------+----+ Lower Pole  21      5        0.76Lower Pole 27      6       0.78 +------------+--------+--------+----+-----------+--------+--------+----+ Hilar       58      11      0.81Hilar      54      10      0.82 +------------+--------+--------+----+-----------+--------+--------+----+ +------------------+----+------------------+----+ Right Kidney          Left Kidney            +------------------+----+------------------+----+ RAR                   RAR                    +------------------+----+------------------+----+ RAR (manual)      2.1 RAR (manual)      2.12 +------------------+----+------------------+----+ Cortex            .67 Cortex            .63  +------------------+----+------------------+----+ Cortex thickness      Corex thickness        +------------------+----+------------------+----+ Kidney length (cm)9.40Kidney length (cm)9.88 +------------------+----+------------------+----+  Summary: Largest Aortic Diameter: 2.4 cm  Renal:  Right: Normal size right kidney. Abnormal right Resistive Index.        Abnormal cortical thickness of right kidney. No evidence of        right renal artery stenosis. RRV flow present. Left:  Normal size of left kidney. Abnormal left Resistive Index.        Abnormal cortical thickness of the left kidney. No evidence        of left renal artery stenosis. LRV flow present. Mesenteric: Normal Celiac artery and Superior Mesenteric artery findings.  Patent IVC.  *See table(s) above for measurements and observations.  Diagnosing physician: Ida Rogue MD  Electronically signed by Ida Rogue MD on 09/26/2022 at 9:36:25 PM.    Final    CT Head Wo Contrast  Result Date: 09/26/2022 CLINICAL DATA:  Syncope/presyncope EXAM: CT HEAD WITHOUT CONTRAST TECHNIQUE: Contiguous axial images were obtained from the base of the skull through the vertex without intravenous contrast. RADIATION DOSE REDUCTION: This exam was performed according to the departmental dose-optimization program  which includes automated exposure control, adjustment of the mA and/or kV according to patient size and/or use of iterative reconstruction technique. COMPARISON:  None Available. FINDINGS: Brain: No acute infarct or hemorrhage. Lateral ventricles and midline structures are unremarkable. No acute extra-axial fluid collections. No mass effect. Vascular: No hyperdense vessel or unexpected calcification. Skull: Normal. Negative for fracture or focal lesion. Sinuses/Orbits: Evidence of chronic left mastoiditis. Remaining paranasal sinuses are clear. Other:  None. IMPRESSION: 1. No acute intracranial process. 2. Sequela of chronic left mastoiditis. Electronically Signed   By: Sharlet Salina M.D.   On: 09/26/2022 16:50    Procedures Procedures    Medications Ordered in ED Medications  dexamethasone (DECADRON) tablet 10 mg (has no administration in time range)  sodium chloride 0.9 % bolus 1,000 mL (1,000 mLs Intravenous New Bag/Given 09/26/22 1934)    ED Course/ Medical Decision Making/ A&P                           Medical Decision Making Amount and/or Complexity of Data Reviewed Radiology: ordered.  Risk Prescription drug management.   82 yo F with dizziness.  This is hard to get an exact picture.  It sounds like the family was concerned about her blood pressure being quite elevated at home.  With this they feel like she was having head symptoms which sounds like it was lightheadedness.  They have seen a cardiologist as well as her family doctor off and on for this.  She had an episode today after having a renal ultrasound where she ended up feeling like she might pass out and had an episode of emesis.  Since then she has felt a bit better.  Blood pressures here have been unremarkable.  No anemia no leukocytosis.  Her metabolic panel is concerning for dehydration with hyponatremia and hypochloremia.  Will give a bolus of IV fluids.  She has been reportedly very unsteady with ambulation for the past  couple weeks.  Discussed the possibility of a posterior circulation stroke with the family risk and benefits of MRI imaging discussed electing for MRI today.  MRI negative for stroke.  Give dose of decadron for possible labyrinthitis.  Meclizine.  PCP follow up.   9:46 PM:  I have discussed the diagnosis/risks/treatment options with the patient.  Evaluation and diagnostic testing in the emergency department does not suggest an emergent condition requiring admission or immediate intervention beyond what has been performed at this time.  They will follow up with PCP. We also discussed returning to the ED immediately if new or worsening sx occur. We discussed the sx which are most concerning (e.g., sudden worsening pain, fever, inability to tolerate by mouth) that necessitate immediate return. Medications administered to the patient during their visit and any new prescriptions provided to the patient are listed below.  Medications given during this visit Medications  dexamethasone (DECADRON) tablet 10 mg (has no administration in time range)  sodium chloride 0.9 % bolus 1,000 mL (1,000 mLs Intravenous New Bag/Given 09/26/22 1934)     The patient appears reasonably screen and/or stabilized for discharge and I doubt any other medical condition or other [facility] requiring further screening, evaluation, or treatment in the ED at this time prior to discharge.            Final Clinical Impression(s) / ED Diagnoses Final diagnoses:  Lightheadedness    Rx / DC Orders ED Discharge Orders          Ordered    meclizine (ANTIVERT) 25 MG tablet  3 times daily PRN        09/26/22 2144              Melene Plan, DO 09/26/22 2146

## 2022-09-26 NOTE — ED Notes (Signed)
Patient transported to MRI 

## 2022-09-26 NOTE — ED Triage Notes (Signed)
Pt BIB [facility] from home d/t getting very lethargic & pale after her kidney appointment today. Plus dealing with HTN & having Covid this past September. 120/70 for EMS, HR 55, 250 fluid in 20 g Rt AC PIV, CBG 143, 99 Temp, 98& O2 on RA.

## 2022-09-26 NOTE — ED Notes (Signed)
AVS reviewed with pt and son of pt. Son verbalizes understanding of teaching. Belongings with pt upon depart. Ambulatory to POV with son to go home.

## 2022-09-26 NOTE — Discharge Instructions (Signed)
Eat and drink as well as you can for the next couple of days.  Follow up with your family doc in the office.

## 2022-09-29 NOTE — Telephone Encounter (Signed)
Spoke with pt's daughter DPR, to reschedule nurse visit.  Pt's daughter states she called 5 days ago and pt was seen in the ED on 09/26/2022.  Appointment with Dr Marlou Porch has been scheduled for 10/01/2022.  Pt's daughter states they need nothing further at this time.

## 2022-10-01 ENCOUNTER — Encounter: Payer: Self-pay | Admitting: Cardiology

## 2022-10-01 ENCOUNTER — Ambulatory Visit: Payer: Medicare Other | Attending: Cardiology | Admitting: Cardiology

## 2022-10-01 VITALS — BP 140/60 | HR 61 | Ht <= 58 in | Wt 124.0 lb

## 2022-10-01 DIAGNOSIS — I10 Essential (primary) hypertension: Secondary | ICD-10-CM | POA: Insufficient documentation

## 2022-10-01 NOTE — Progress Notes (Signed)
Cardiology Office Note:    Date:  10/01/2022   ID:  [name], [name] 1940-09-23, MRN [id number]  PCP:  Vivi Barrack, MD   [facility] Cardiologist:  Candee Furbish, MD     Referring MD: Vivi Barrack, MD    History of Present Illness:    Lynn [name] is a 82 y.o. female here for the evaluation of lightheadedness at the request of Dr. Jerline Pain.And Dr. Deno Etienne in the emergency department.  She had lightheadedness that is been going on for weeks on and.  Very unsteady with ambulation.  She thought it was secondary to her blood pressure medications.  Blood pressure medications have been changing fairly rapidly based upon her perceived symptoms. MRI was negative for stroke.  Was seen last in our office on 09/08/2022 by Shaaron Adler, PA.  Has hypertension anxiety moderate cognitive impairment with blood pressure elevation usually in the evening previously on captopril.  I believe her daughter would like her to be on captopril.  Renal artery duplex was normal on 09/26/2022.  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  Current Medications: Current Meds  Medication Sig   amLODipine (NORVASC) 2.5 MG tablet Take 1 tablet (2.5 mg total) by mouth daily.   aspirin EC 81 MG tablet Take 81 mg by mouth every other day. Swallow whole.   atorvastatin (LIPITOR) 10 MG tablet TAKE 1 TABLET (10 MG TOTAL) BY MOUTH DAILY AT 6 PM.   azelastine (ASTELIN) 0.1 % nasal spray Place 2 sprays into both nostrils 2 (two) times daily.   baclofen (LIORESAL) 10 MG tablet Take 0.5-1 tablets (5-10 mg total) by mouth 3 (three) times daily as needed for muscle spasms.   calcium carbonate (OS-CAL - DOSED IN MG OF ELEMENTAL CALCIUM) 1250 (500 Ca) MG tablet  Take 1 tablet by mouth 3 (three) times a week.   diclofenac Sodium (VOLTAREN) 1 % GEL Apply 4 g topically 4 (four) times daily as needed.   hydrOXYzine (ATARAX) 10 MG tablet Take 1 tablet (10 mg total) by mouth 3 (three) times daily as needed for anxiety.   ibuprofen (ADVIL,MOTRIN) 200 MG tablet Take 200 mg by mouth every 6 (six) hours as needed.   losartan (COZAAR) 50 MG tablet Take 50 mg by mouth 2 (two) times daily.   meclizine (ANTIVERT) 25 MG tablet Take 1 tablet (25 mg total) by mouth 3 (three) times daily as needed for dizziness.   metoprolol tartrate (LOPRESSOR) 25 MG tablet TAKE 1 TABLET BY MOUTH TWICE A DAY   Multiple Vitamin (MULTIVITAMIN) tablet Take 1 tablet by mouth 2 (two) times a week.   Pyridoxine HCl (VITAMIN B-6 PO) Take by mouth.   Thiamine HCl (VITAMIN B-1 PO) Take by mouth.   valsartan-hydrochlorothiazide (DIOVAN HCT) 160-12.5 MG tablet Take 1 tablet by mouth in the morning and at bedtime.     Allergies:   Patient has no known allergies.   Social History   Socioeconomic History   Marital status: Widowed    Spouse name: Not on file   Number of children: 2   Years of education: some college   Highest education level: Not on file  Occupational History   Occupation: Retired  Tobacco Use  Smoking status: Never   Smokeless tobacco: Never  Vaping Use   Vaping Use: Never used  Substance and Sexual Activity   Alcohol use: No   Drug use: No   Sexual activity: Not Currently  Other Topics Concern   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; clerical work for office   Social Determinants of Corporate investment banker Strain: Not on [employer] Insecurity: Not on file  Transportation Needs: Not on file  Physical Activity: Not on file  Stress: Not on file   Social Connections: Not on file      Recent Labs: 09/02/2022: ALT 16; TSH 2.30 09/26/2022: BUN 16; Creatinine, Ser 0.82; Hemoglobin 12.7; Platelets 343; Potassium 3.4; Sodium 129  Recent Lipid Panel    Component Value Date/Time   CHOL 143 07/18/2022 1230   CHOL 119 12/02/2019 0922   TRIG 82.0 07/18/2022 1230   HDL 60.60 07/18/2022 1230   HDL 50 12/02/2019 0922   CHOLHDL 2 07/18/2022 1230   VLDL 16.4 07/18/2022 1230   LDLCALC 66 07/18/2022 1230   LDLCALC 53 12/02/2019 0922        Physical Exam:    VS:  BP (!) 140/60   Pulse 61   Ht 4\' 9"  (1.448 m)   Wt 124 lb (56.2 kg)   SpO2 94%   BMI 26.83 kg/m     Wt Readings from Last 3 Encounters:  10/01/22 124 lb (56.2 kg)  09/08/22 123 lb (55.8 kg)  09/02/22 123 lb 12.8 oz (56.2 kg)     GEN:  Well nourished, well developed in no acute distress HEENT: Normal NECK: No JVD; No carotid bruits LYMPHATICS: No lymphadenopathy CARDIAC: RRR, no murmurs, rubs, gallops RESPIRATORY:  Clear to auscultation without rales, wheezing or rhonchi  ABDOMEN: Soft, non-tender, non-distended MUSCULOSKELETAL:  No edema; No deformity  SKIN: Warm and dry NEUROLOGIC:  Alert and oriented x 3 PSYCHIATRIC:  Normal affect   ASSESSMENT:    1. Essential hypertension    PLAN:    In order of problems listed above:  Essential hypertension - There does not appear to be a vascular cause, renal artery duplex was normal showing no evidence of renal artery stenosis. -Cardiology workup for hypertension has been unremarkable.  I would recommend continued blood pressure management per Dr. 14/12/23.  I think we are running into a situation where there are too many individuals trying to adjust her blood pressure.  I would like for Dr. Jimmey Ralph, her primary care physician to manage this entity. -Daughter would like her to be on captopril.  This is fine.  Explained how I would not use an ACE inhibitor like captopril with an ARB.  She did not like to use amlodipine  because of swelling.  Please let Jimmey Ralph know if we can be of further assistance.          Medication Adjustments/Labs and Tests Ordered: Current medicines are reviewed at length with the patient today.  Concerns regarding medicines are outlined above.  No orders of the defined types were placed in this encounter.  No orders of the defined types were placed in this encounter.   Patient Instructions  Medication Instructions:  No changes *If you need a refill on your cardiac  medications before your next appointment, please call your pharmacy*   Lab Work: none If you have labs (blood work) drawn today and your tests are completely normal, you will receive your results only by: [place] (if you have MyChart) OR A paper copy in the mail If you have any lab test that is abnormal or we need to change your treatment, we will call you to review the results.   Testing/Procedures: none   Follow-Up: As needed  Important Information About Sugar         Signed, Candee Furbish, MD  10/01/2022 3:19 PM    [place]

## 2022-10-01 NOTE — Patient Instructions (Signed)
Medication Instructions:  No changes *If you need a refill on your cardiac medications before your next appointment, please call your pharmacy*   Lab Work: none If you have labs (blood work) drawn today and your tests are completely normal, you will receive your results only by: MyChart Message (if you have MyChart) OR A paper copy in the mail If you have any lab test that is abnormal or we need to change your treatment, we will call you to review the results.   Testing/Procedures: none   Follow-Up: As needed  Important Information About Sugar       

## 2022-10-01 NOTE — Telephone Encounter (Signed)
Patient seeing Dr Marlou Porch today at 2:20 PM.

## 2022-10-29 ENCOUNTER — Ambulatory Visit (INDEPENDENT_AMBULATORY_CARE_PROVIDER_SITE_OTHER): Payer: Medicare Other | Admitting: Family Medicine

## 2022-10-29 ENCOUNTER — Encounter: Payer: Self-pay | Admitting: Family Medicine

## 2022-10-29 VITALS — BP 171/70 | HR 61 | Temp 98.0°F | Ht <= 58 in | Wt 126.0 lb

## 2022-10-29 DIAGNOSIS — F419 Anxiety disorder, unspecified: Secondary | ICD-10-CM

## 2022-10-29 DIAGNOSIS — I10 Essential (primary) hypertension: Secondary | ICD-10-CM

## 2022-10-29 MED ORDER — LOSARTAN POTASSIUM 50 MG PO TABS: 50.0000 mg | ORAL_TABLET | Freq: Two times a day (BID) | ORAL | 3 refills | Status: DC

## 2022-10-29 MED ORDER — HYDROXYZINE HCL 10 MG PO TABS: 10.0000 mg | ORAL_TABLET | Freq: Three times a day (TID) | ORAL | 3 refills | Status: DC | PRN

## 2022-10-29 NOTE — Assessment & Plan Note (Signed)
Blood pressure continues to be labile.  171/70 today.  Not currently having any symptoms.  She is having intermittent issues with symptomatic lows.  Daughter is very anxious about this and wants to see hypertension specialist.  We did place a referral last month however they have not heard anything back.  Her regular cardiologist deferred managing hypertension.  She would likely benefit from ambulatory blood pressure monitoring to get a better sense of what her readings throughout the day are.  Will place referral to advanced hypertension clinic for further evaluation per request of family.  We will continue current regimen losartan 50 mg twice daily, amlodipine 2.5 mg daily, and metoprolol tartrate 25 mg twice daily.

## 2022-10-29 NOTE — Assessment & Plan Note (Signed)
Still has ongoing issue use with anxiety.  We did prescribe hydroxyzine several months ago which seems to be working well.  We will refill today.  We discussed potential side effects.  Daughter did ask about potentially starting Librium however recommended against this due to side effect profile.  Daughter voiced understanding.

## 2022-10-29 NOTE — Patient Instructions (Signed)
It was very nice to see you today!  I will refer you to see the hypertension specialist.  Please use the hydroxyzine as needed.  I will refill your BP medications today.   Take care, Dr Jerline Pain  PLEASE NOTE:  If you had any lab tests, please let us know if you have not heard back within a few days. You may see your results on mychart before we have a chance to review them but we will give you a call once they are reviewed by [place].   If we ordered any referrals today, please let us know if you have not heard from their office within the next week.   If you had any urgent prescriptions sent in today, please check with the pharmacy within an hour of our visit to make sure the prescription was transmitted appropriately.   Please try these tips to maintain a healthy lifestyle:  Eat at least 3 REAL meals and 1-2 snacks per day.  Aim for no more than 5 hours between eating.  If you eat breakfast, please do so within one hour of getting up.   Each meal should contain half fruits/vegetables, one quarter protein, and one quarter carbs (no bigger than a computer mouse)  Cut down on sweet beverages. This includes juice, soda, and sweet tea.   Drink at least 1 glass of water with each meal and aim for at least 8 glasses per day  Exercise at least 150 minutes every week.

## 2022-10-29 NOTE — Progress Notes (Signed)
   Lynn [name] is a 82 y.o. female who presents today for an office visit.  Assessment/Plan:  Chronic Problems Addressed Today: Hypertension Blood pressure continues to be labile.  171/70 today.  Not currently having any symptoms.  She is having intermittent issues with symptomatic lows.  Daughter is very anxious about this and wants to see hypertension specialist.  We did place a referral last month however they have not heard anything back.  Her regular cardiologist deferred managing hypertension.  She would likely benefit from ambulatory blood pressure monitoring to get a better sense of what her readings throughout the day are.  Will place referral to advanced hypertension clinic for further evaluation per request of family.  We will continue current regimen losartan 50 mg twice daily, amlodipine 2.5 mg daily, and metoprolol tartrate 25 mg twice daily.  Anxiety Still has ongoing issue use with anxiety.  We did prescribe hydroxyzine several months ago which seems to be working well.  We will refill today.  We discussed potential side effects.  Daughter did ask about potentially starting Librium however recommended against this due to side effect profile.  Daughter voiced understanding.     Subjective:  HPI:  See A/p for status of chronic conditions. She is here with her daughter today. Main concern today is blood pressure. We saw her about 2 months ago for this and they were concerned about labile blood pressure readings.  She then followed up with cardiology and they changed her blood pressure medications. She then followed up with cardiology again and they told her they would not be further managing her blood pressure.   She is still having intermittent issues with labile blood pressures. She is currently taking losartan 50mg  twice daily, metoprolol tartrate 25mg  twice daily, and norvasc 2.5mg  once daily. She had an episode of hypotension a couple of weeks ago and EMS was  called. Symptoms improved and she did not go to the ED.   Patient's daughter also is concerned that she is having worsening anxiety. She would like something to have on hand for this. We prescribed hydroxyzine several months for this. They think it worked well.        Objective:  Physical Exam: BP (!) 171/70   Pulse 61   Temp 98 F (36.7 C) (Temporal)   Ht 4\' 9"  (1.448 m)   Wt 126 lb (57.2 kg)   SpO2 99%   BMI 27.27 kg/m   Gen: No acute distress, resting comfortably HEENT: Left TM with a large perforation.  No signs of infection. CV: Regular rate and rhythm with no murmurs appreciated Pulm: Normal work of breathing, clear to auscultation bilaterally with no crackles, wheezes, or rhonchi Neuro: Grossly normal, moves all extremities Psych: Normal affect and thought content      Lynn Canny M. Jerline Pain, MD 10/29/2022 11:05 AM

## 2022-11-16 ENCOUNTER — Other Ambulatory Visit: Payer: Self-pay | Admitting: Family Medicine

## 2022-11-16 DIAGNOSIS — E785 Hyperlipidemia, unspecified: Secondary | ICD-10-CM

## 2022-11-20 ENCOUNTER — Other Ambulatory Visit: Payer: Self-pay | Admitting: Family Medicine

## 2022-11-21 ENCOUNTER — Telehealth: Payer: Self-pay | Admitting: *Deleted

## 2022-11-21 NOTE — Telephone Encounter (Signed)
(  KeyRonnie DerbyLE:[id number] hydrOXYzine HCl '10MG'$  tablets Status: PA RequestCreated: March 1st, 2024 [id number]: March 1st, 2024

## 2022-11-21 NOTE — Telephone Encounter (Signed)
This approval is for 11/07/2022 until further notice Pharmacy notified

## 2022-12-14 ENCOUNTER — Other Ambulatory Visit: Payer: Self-pay | Admitting: Family Medicine

## 2022-12-14 DIAGNOSIS — I10 Essential (primary) hypertension: Secondary | ICD-10-CM

## 2022-12-23 ENCOUNTER — Other Ambulatory Visit: Payer: Self-pay | Admitting: Physical Medicine and Rehabilitation

## 2022-12-23 ENCOUNTER — Telehealth: Payer: Self-pay | Admitting: Physical Medicine and Rehabilitation

## 2022-12-23 NOTE — Telephone Encounter (Signed)
Requesting a refill on the Gabapentin 100 mg. Please advise

## 2022-12-24 ENCOUNTER — Other Ambulatory Visit: Payer: Self-pay

## 2022-12-24 ENCOUNTER — Telehealth: Payer: Self-pay | Admitting: Family Medicine

## 2022-12-24 MED ORDER — LOSARTAN POTASSIUM 100 MG PO TABS: 100.0000 mg | ORAL_TABLET | Freq: Every day | ORAL | 3 refills | Status: DC

## 2022-12-24 NOTE — Telephone Encounter (Signed)
Rx sent 

## 2022-12-24 NOTE — Telephone Encounter (Signed)
Per patients daughter insurance will not cover losartan (COZAAR) 50 MG tablet needs it changed to 100mg  . Pharmacy pt is using is [id number] - [place], [employer]

## 2022-12-24 NOTE — Telephone Encounter (Signed)
OK to send in 100 mg dose? Please advise

## 2022-12-24 NOTE — Telephone Encounter (Signed)
Ok with me. Please place any necessary orders. 

## 2022-12-25 ENCOUNTER — Ambulatory Visit (INDEPENDENT_AMBULATORY_CARE_PROVIDER_SITE_OTHER): Payer: Medicare Other | Admitting: Physical Medicine and Rehabilitation

## 2022-12-25 ENCOUNTER — Encounter: Payer: Self-pay | Admitting: Physical Medicine and Rehabilitation

## 2022-12-25 DIAGNOSIS — M5416 Radiculopathy, lumbar region: Secondary | ICD-10-CM

## 2022-12-25 DIAGNOSIS — M5442 Lumbago with sciatica, left side: Secondary | ICD-10-CM

## 2022-12-25 DIAGNOSIS — M5116 Intervertebral disc disorders with radiculopathy, lumbar region: Secondary | ICD-10-CM

## 2022-12-25 DIAGNOSIS — M5441 Lumbago with sciatica, right side: Secondary | ICD-10-CM | POA: Diagnosis not present

## 2022-12-25 DIAGNOSIS — G8929 Other chronic pain: Secondary | ICD-10-CM

## 2022-12-25 MED ORDER — GABAPENTIN 100 MG PO CAPS: 100.0000 mg | ORAL_CAPSULE | Freq: Three times a day (TID) | ORAL | 0 refills | Status: DC

## 2022-12-25 NOTE — Telephone Encounter (Signed)
Patient is scheduled for follow up on 12/25/22

## 2022-12-25 NOTE — Progress Notes (Signed)
Functional Pain Scale - descriptive words and definitions  Distracting (5)    Aware of pain/able to complete some ADL's but limited by pain/sleep is affected and active distractions are only slightly useful. Moderate range order  Average Pain  varies  Lower back pain that radiates in both legs to the ankle

## 2022-12-25 NOTE — Progress Notes (Signed)
Lynn [name] - 82 y.o. female MRN [id number]  Date of birth: 02-Jul-1941  Office Visit Note: Visit Date: 12/25/2022 PCP: Vivi Barrack, MD Referred by: Vivi Barrack, MD  Subjective: Chief Complaint  Patient presents with   Lower Back - Pain   HPI: Lynn [name] is a 82 y.o. female who comes in today for evaluation of chronic, worsening and severe bilateral lower back pain radiating to buttocks, hips and down anterior thighs to knees. Patient does not speak [place], daughter at bedside to help translate. Patient was previously treated by Dr. Joni Fears from orthopedic standpoint. Pain ongoing for several years. Pain worsens with activity and walking. She describes pain as sore and aching, currently rates as 8 out of 10. Some relief of pain with home exercise regimen, rest and use of medications. Previously taking Gabapentin 100 mg 2-3 times a day with good relief of pain.  Lumbar MRI imaging from 2021 exhibits multilevel disc degeneration, most severe at L4-L5 where there is moderate to severe bilateral foraminal stenosis that could cause bilateral L4 nerve root impingement. There is a small central disc protrusion at L5-S1. Patient underwent right L5-S1 interlaminar epidural steroid injection in our office on 07/02/2022, no relief of pain with this procedure. Daughter states patient was given lumbosacral brace in August of 2023, patient did not use brace and was charged for this. She is requesting that we return the brace today. Patient denies focal weakness, numbness and tingling. No recent trauma or falls.    Review of Systems  Musculoskeletal:  Positive for back pain.  Neurological:  Negative for tingling, sensory change, focal weakness and weakness.  All other systems reviewed and are negative.  Otherwise per HPI.  Assessment & Plan: Visit Diagnoses:    ICD-10-CM   1. Chronic bilateral low back pain with bilateral sciatica  M54.42 Ambulatory referral to  Physical Medicine Rehab   M54.41    G89.29     2. Lumbar radiculopathy  M54.16 Ambulatory referral to Physical Medicine Rehab    3. Intervertebral disc disorders with radiculopathy, lumbar region  M51.16 Ambulatory referral to Physical Medicine Rehab       Plan: Findings:  Chronic, worsening and severe bilateral lower back pain radiating to buttocks, hips and down anterior thighs to knees. Patient continues to have severe pain despite good conservative therapies such as home exercise regimen, rest and use of medications. Patients clinical presentation and exam are consistent with L4 nerve pattern. There is moderate to severe bilateral foraminal stenosis at L4-L5 with possible L4 nerve root impingement. Next step is to perform diagnostic and hopefully therapeutic bilateral L4 transforaminal epidural steroid injection under fluoroscopic guidance. If good relief of pain with injection we can repeat this procedure infrequently as needed. I also discussed medication management with patient today, I will refill Gabapentin for her, we also talked about titrating dosage up slowly. She has been out of Gabapentin for several weeks, will start 100 mg at bedtime, if doing well can increase to twice a day. If her pain persists we did discuss obtaining new lumbar MRI imaging. No red flag symptoms noted upon exam today.     Meds & Orders:  Meds ordered this encounter  Medications   gabapentin (NEURONTIN) 100 MG capsule    Sig: Take 1 capsule (100 mg total) by mouth 3 (three) times daily.    Dispense:  90 capsule    Refill:  0    Orders Placed This Encounter  Procedures  Ambulatory referral to Physical Medicine Rehab    Follow-up: Return for Bilateral L4 transforaminal epidural steroid injection.   Procedures: No procedures performed      Clinical History: EXAM: MRI LUMBAR SPINE WITHOUT CONTRAST   TECHNIQUE: Multiplanar, multisequence MR imaging of the lumbar spine was performed. No intravenous  contrast was administered.   COMPARISON:  Lumbar spine radiographs 01/27/2020   FINDINGS: Segmentation: The lowest fully formed intervertebral disc space is designated L5-S1. L5 is mildly transitional.   Alignment:  Trace retrolisthesis of L1 on L2 and L2 on L3.   Vertebrae: No fracture or suspicious osseous lesion. Scattered small Schmorl's nodes. Degenerative endplate changes throughout the lumbar spine including mild edema.   Conus medullaris and cauda equina: Conus extends to the L1 level. Conus and cauda equina appear normal.   Paraspinal and other soft tissues: Unremarkable.   Disc levels:   Disc desiccation throughout the lumbar spine. Severe disc space narrowing at L1-2 and L4-5 and mild narrowing at L2-3 and L3-4.   T11-12: Minimal disc bulging without stenosis.   T12-L1: Mild disc bulging without stenosis.   L1-2: Left eccentric disc bulging results in mild left neural foraminal stenosis without spinal stenosis.   L2-3: Disc bulging and a small superimposed left foraminal disc protrusion result in mild bilateral lateral recess stenosis and mild left neural foraminal stenosis without significant spinal stenosis.   L3-4: Disc bulging slightly eccentric to the right results in borderline lateral recess stenosis and mild right neural foraminal stenosis without spinal stenosis.   L4-5: Disc bulging, endplate spurring, severe disc space height loss, and mild facet and ligamentum flavum hypertrophy result in moderate to severe bilateral neural foraminal stenosis with potential bilateral L4 nerve root impingement. No spinal stenosis.   L5-S1: Right eccentric disc bulging and mild right facet hypertrophy result in mild-to-moderate right neural foraminal stenosis. There is a small central disc protrusion without spinal stenosis.   IMPRESSION: 1. Widespread lumbar disc degeneration, worst at L4-5 where there is moderate to severe bilateral neural foraminal  stenosis. 2. Mild-to-moderate right neural foraminal stenosis at L5-S1. 3. No significant spinal stenosis.     Electronically Signed   By: Logan Bores M.D.   On: 07/08/2020 15:20   She reports that she has never smoked. She has never used smokeless tobacco.  Recent Labs    07/18/22 1230  HGBA1C 6.3    Objective:  VS:  HT:    WT:   BMI:     BP:   HR: bpm  TEMP: ( )  RESP:  Physical Exam Vitals and nursing note reviewed.  HENT:     Head: Normocephalic and atraumatic.     Right Ear: External ear normal.     Left Ear: External ear normal.     Nose: Nose normal.     Mouth/Throat:     Mouth: Mucous membranes are moist.  Eyes:     Extraocular Movements: Extraocular movements intact.  Cardiovascular:     Rate and Rhythm: Normal rate.     Pulses: Normal pulses.  Pulmonary:     Effort: Pulmonary effort is normal.  Abdominal:     General: Abdomen is flat. There is no distension.  Musculoskeletal:        General: Tenderness present.     Cervical back: Normal range of motion.     Comments: Patient rises from seated position to standing without difficulty. Good lumbar range of motion. No pain noted with facet loading. 5/5 strength noted with bilateral  hip flexion, knee flexion/extension, ankle dorsiflexion/plantarflexion and EHL. No clonus noted bilaterally. No pain upon palpation of greater trochanters. No pain with internal/external rotation of bilateral hips. Sensation intact bilaterally. Dysesthesias noted to bilateral L4 dermatomes. Ambulates without aid, gait steady.     Skin:    General: Skin is warm and dry.     Capillary Refill: Capillary refill takes less than 2 seconds.  Neurological:     General: No focal deficit present.     Mental Status: She is alert and oriented to person, place, and time.  Psychiatric:        Mood and Affect: Mood normal.        Behavior: Behavior normal.     Ortho Exam  Imaging: No results found.  Past Medical/Family/Surgical/Social  History: Medications & Allergies reviewed per EMR, new medications updated. Patient Active Problem List   Diagnosis Date Noted   Anxiety 07/18/2022   Leg edema 05/22/2022   Low back pain 05/21/2022   Bilateral primary osteoarthritis of knee 05/28/2021   Decreased appetite 03/07/2021   Osteoarthritis 01/09/2021   Moderate dementia without behavioral disturbance 11/19/2019   Prediabetes 11/19/2019   Hypertension 08/03/2019   Enlarged thyroid gland 03/01/2017   Hyperlipidemia 03/01/2017   Past Medical History:  Diagnosis Date   Arthritis    Bradycardia 03/01/2017   Edema 03/01/2017   Enlarged thyroid gland 03/01/2017   Hyperlipidemia 03/01/2017   Hypertension    Memory loss    Mild cognitive disorder 06/14/2018   Family History  Problem Relation Age of Onset   Diverticulitis Mother    Other Father        unsure   Hypertension Sister    History reviewed. No pertinent surgical history. Social History   Occupational History   Occupation: Retired  Tobacco Use   Smoking status: Never   Smokeless tobacco: Never  Vaping Use   Vaping Use: Never used  Substance and Sexual Activity   Alcohol use: No   Drug use: No   Sexual activity: Not Currently

## 2023-01-02 ENCOUNTER — Ambulatory Visit: Payer: Medicare Other | Admitting: Cardiology

## 2023-01-08 ENCOUNTER — Other Ambulatory Visit: Payer: Self-pay | Admitting: Family Medicine

## 2023-01-08 ENCOUNTER — Other Ambulatory Visit: Payer: Self-pay

## 2023-01-08 ENCOUNTER — Ambulatory Visit (INDEPENDENT_AMBULATORY_CARE_PROVIDER_SITE_OTHER): Payer: Medicare Other | Admitting: Physical Medicine and Rehabilitation

## 2023-01-08 VITALS — BP 152/70 | HR 63

## 2023-01-08 DIAGNOSIS — M5416 Radiculopathy, lumbar region: Secondary | ICD-10-CM

## 2023-01-08 MED ORDER — METHYLPREDNISOLONE ACETATE 80 MG/ML IJ SUSP
80.0000 mg | Freq: Once | INTRAMUSCULAR | Status: AC
  Administered 2023-01-08: 80 mg

## 2023-01-08 NOTE — Progress Notes (Signed)
Functional Pain Scale - descriptive words and definitions  Distressing (6)    Pain is present/unable to complete most ADLs limited by pain/sleep is difficult and active distraction is only marginal. Moderate range order  Average Pain 7   +Driver, -BT, -Dye Allergies.  Lower back pain on both sides that radiates into hips

## 2023-01-08 NOTE — Patient Instructions (Signed)

## 2023-01-18 NOTE — Progress Notes (Signed)
Lynn [name] - 82 y.o. female MRN [id number]  Date of birth: Jan 02, 1941  Office Visit Note: Visit Date: 01/08/2023 PCP: Ardith Dark, MD Referred by: Ardith Dark, MD  Subjective: Chief Complaint  Patient presents with   Lower Back - Pain   HPI:  Lynn [name] is a 82 y.o. female who comes in today at the request of Ellin Goodie, FNP for planned Bilateral L4-5 Lumbar Transforaminal epidural steroid injection with fluoroscopic guidance.  The patient has failed conservative care including home exercise, medications, time and activity modification.  This injection will be diagnostic and hopefully therapeutic.  Please see requesting physician notes for further details and justification.   ROS Otherwise per HPI.  Assessment & Plan: Visit Diagnoses:    ICD-10-CM   1. Lumbar radiculopathy  M54.16 XR C-ARM NO REPORT    Epidural Steroid injection    methylPREDNISolone acetate (DEPO-MEDROL) injection 80 mg      Plan: No additional findings.   Meds & Orders:  Meds ordered this encounter  Medications   methylPREDNISolone acetate (DEPO-MEDROL) injection 80 mg    Orders Placed This Encounter  Procedures   XR C-ARM NO REPORT   Epidural Steroid injection    Follow-up: Return for visit to requesting provider as needed.   Procedures: No procedures performed  Lumbosacral Transforaminal Epidural Steroid Injection - Sub-Pedicular Approach with Fluoroscopic Guidance  Patient: Lynn [name]      Date of Birth: October 04, 1940 MRN: [id number] PCP: Ardith Dark, MD      Visit Date: 01/08/2023   Universal Protocol:    Date/Time: 01/08/2023  Consent Given By: the patient  Position: PRONE  Additional Comments: Vital signs were monitored before and after the procedure. Patient was prepped and draped in the usual sterile fashion. The correct patient, procedure, and site was verified.   Injection Procedure Details:   Procedure diagnoses:  Lumbar radiculopathy [M54.16]    Meds Administered:  Meds ordered this encounter  Medications   methylPREDNISolone acetate (DEPO-MEDROL) injection 80 mg    Laterality: Bilateral  Location/Site: L4  Needle:5.0 in., 22 ga.  Short bevel or Quincke spinal needle  Needle Placement: Transforaminal  Findings:    -Comments: Excellent flow of contrast along the nerve, nerve root and into the epidural space.  Procedure Details: After squaring off the end-plates to get a true AP view, the C-arm was positioned so that an oblique view of the foramen as noted above was visualized. The target area is just inferior to the "nose of the scotty dog" or sub pedicular. The soft tissues overlying this structure were infiltrated with 2-3 ml. of 1% Lidocaine without Epinephrine.  The spinal needle was inserted toward the target using a "trajectory" view along the fluoroscope beam.  Under AP and lateral visualization, the needle was advanced so it did not puncture dura and was located close the 6 O'Clock position of the pedical in AP tracterory. Biplanar projections were used to confirm position. Aspiration was confirmed to be negative for CSF and/or blood. A 1-2 ml. volume of Isovue-250 was injected and flow of contrast was noted at each level. Radiographs were obtained for documentation purposes.   After attaining the desired flow of contrast documented above, a 0.5 to 1.0 ml test dose of 0.25% Marcaine was injected into each respective transforaminal space.  The patient was observed for 90 seconds post injection.  After no sensory deficits were reported, and normal lower extremity motor function was noted,   the above injectate  was administered so that equal amounts of the injectate were placed at each foramen (level) into the transforaminal epidural space.   Additional Comments:  No complications occurred Dressing: 2 x 2 sterile gauze and Band-Aid    Post-procedure details: Patient was observed during  the procedure. Post-procedure instructions were reviewed.  Patient left the clinic in stable condition.    Clinical History: EXAM: MRI LUMBAR SPINE WITHOUT CONTRAST   TECHNIQUE: Multiplanar, multisequence MR imaging of the lumbar spine was performed. No intravenous contrast was administered.   COMPARISON:  Lumbar spine radiographs 01/27/2020   FINDINGS: Segmentation: The lowest fully formed intervertebral disc space is designated L5-S1. L5 is mildly transitional.   Alignment:  Trace retrolisthesis of L1 on L2 and L2 on L3.   Vertebrae: No fracture or suspicious osseous lesion. Scattered small Schmorl's nodes. Degenerative endplate changes throughout the lumbar spine including mild edema.   Conus medullaris and cauda equina: Conus extends to the L1 level. Conus and cauda equina appear normal.   Paraspinal and other soft tissues: Unremarkable.   Disc levels:   Disc desiccation throughout the lumbar spine. Severe disc space narrowing at L1-2 and L4-5 and mild narrowing at L2-3 and L3-4.   T11-12: Minimal disc bulging without stenosis.   T12-L1: Mild disc bulging without stenosis.   L1-2: Left eccentric disc bulging results in mild left neural foraminal stenosis without spinal stenosis.   L2-3: Disc bulging and a small superimposed left foraminal disc protrusion result in mild bilateral lateral recess stenosis and mild left neural foraminal stenosis without significant spinal stenosis.   L3-4: Disc bulging slightly eccentric to the right results in borderline lateral recess stenosis and mild right neural foraminal stenosis without spinal stenosis.   L4-5: Disc bulging, endplate spurring, severe disc space height loss, and mild facet and ligamentum flavum hypertrophy result in moderate to severe bilateral neural foraminal stenosis with potential bilateral L4 nerve root impingement. No spinal stenosis.   L5-S1: Right eccentric disc bulging and mild right facet  hypertrophy result in mild-to-moderate right neural foraminal stenosis. There is a small central disc protrusion without spinal stenosis.   IMPRESSION: 1. 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 2. Mild-to-moderate right neural foraminal stenosis at L5-S1. 3. No significant spinal stenosis.     Electronically Signed   By: Sebastian Ache M.D.   On: 07/08/2020 15:20     Objective:  VS:  HT:    WT:   BMI:     BP:(!) 152/70  HR:63bpm  TEMP: ( )  RESP:  Physical Exam Vitals and nursing note reviewed.  Constitutional:      General: She is not in acute distress.    Appearance: Normal appearance. She is well-developed. She is not ill-appearing.  HENT:     Head: Normocephalic and atraumatic.     Right Ear: External ear normal.     Left Ear: External ear normal.  Eyes:     Extraocular Movements: Extraocular movements intact.     Conjunctiva/sclera: Conjunctivae normal.     Pupils: Pupils are equal, round, and reactive to light.  Cardiovascular:     Rate and Rhythm: Normal rate.     Pulses: Normal pulses.  Pulmonary:     Effort: Pulmonary effort is normal. No respiratory distress.  Abdominal:     General: There is no distension.     Palpations: Abdomen is soft.  Musculoskeletal:        General: Tenderness  present.     Cervical back: Neck supple.     Right lower leg: No edema.     Left lower leg: No edema.     Comments: Patient has good distal strength with no pain over the greater trochanters.  No clonus or focal weakness.  Skin:    General: Skin is warm and dry.     Findings: No erythema, lesion or rash.  Neurological:     General: No focal deficit present.     Mental Status: She is alert and oriented to person, place, and time.     Sensory: No sensory deficit.     Motor: No weakness or abnormal muscle tone.     Coordination: Coordination normal.     Gait: Gait normal.  Psychiatric:         Mood and Affect: Mood normal.        Behavior: Behavior normal.      Imaging: No results found.

## 2023-01-18 NOTE — Procedures (Signed)
Lumbosacral Transforaminal Epidural Steroid Injection - Sub-Pedicular Approach with Fluoroscopic Guidance  Patient: Lynn [name]      Date of Birth: 1940-09-29 MRN: [id number] PCP: Ardith Dark, MD      Visit Date: 01/08/2023   Universal Protocol:    Date/Time: 01/08/2023  Consent Given By: the patient  Position: PRONE  Additional Comments: Vital signs were monitored before and after the procedure. Patient was prepped and draped in the usual sterile fashion. The correct patient, procedure, and site was verified.   Injection Procedure Details:   Procedure diagnoses: Lumbar radiculopathy [M54.16]    Meds Administered:  Meds ordered this encounter  Medications   methylPREDNISolone acetate (DEPO-MEDROL) injection 80 mg    Laterality: Bilateral  Location/Site: L4  Needle:5.0 in., 22 ga.  Short bevel or Quincke spinal needle  Needle Placement: Transforaminal  Findings:    -Comments: Excellent flow of contrast along the nerve, nerve root and into the epidural space.  Procedure Details: After squaring off the end-plates to get a true AP view, the C-arm was positioned so that an oblique view of the foramen as noted above was visualized. The target area is just inferior to the "nose of the scotty dog" or sub pedicular. The soft tissues overlying this structure were infiltrated with 2-3 ml. of 1% Lidocaine without Epinephrine.  The spinal needle was inserted toward the target using a "trajectory" view along the fluoroscope beam.  Under AP and lateral visualization, the needle was advanced so it did not puncture dura and was located close the 6 O'Clock position of the pedical in AP tracterory. Biplanar projections were used to confirm position. Aspiration was confirmed to be negative for CSF and/or blood. A 1-2 ml. volume of Isovue-250 was injected and flow of contrast was noted at each level. Radiographs were obtained for documentation purposes.   After attaining  the desired flow of contrast documented above, a 0.5 to 1.0 ml test dose of 0.25% Marcaine was injected into each respective transforaminal space.  The patient was observed for 90 seconds post injection.  After no sensory deficits were reported, and normal lower extremity motor function was noted,   the above injectate was administered so that equal amounts of the injectate were placed at each foramen (level) into the transforaminal epidural space.   Additional Comments:  No complications occurred Dressing: 2 x 2 sterile gauze and Band-Aid    Post-procedure details: Patient was observed during the procedure. Post-procedure instructions were reviewed.  Patient left the clinic in stable condition.

## 2023-01-20 ENCOUNTER — Encounter (HOSPITAL_BASED_OUTPATIENT_CLINIC_OR_DEPARTMENT_OTHER): Payer: Self-pay | Admitting: Cardiovascular Disease

## 2023-01-20 ENCOUNTER — Ambulatory Visit (INDEPENDENT_AMBULATORY_CARE_PROVIDER_SITE_OTHER): Payer: Medicare Other | Admitting: Cardiovascular Disease

## 2023-01-20 VITALS — BP 174/69 | HR 59 | Ht <= 58 in | Wt 124.7 lb

## 2023-01-20 DIAGNOSIS — I10 Essential (primary) hypertension: Secondary | ICD-10-CM

## 2023-01-20 DIAGNOSIS — E785 Hyperlipidemia, unspecified: Secondary | ICD-10-CM

## 2023-01-20 MED ORDER — HYDRALAZINE HCL 25 MG PO TABS: ORAL_TABLET | ORAL | 1 refills | Status: DC

## 2023-01-20 MED ORDER — SPIRONOLACTONE 25 MG PO TABS: ORAL_TABLET | ORAL | 3 refills | Status: DC

## 2023-01-20 MED ORDER — LISINOPRIL 40 MG PO TABS: 40.0000 mg | ORAL_TABLET | Freq: Every day | ORAL | 3 refills | Status: DC

## 2023-01-20 NOTE — Patient Instructions (Addendum)
Medication Instructions:  STOP LOSARTAN   START LISINOPRIL 40 MG DAILY STARTING TONIGHT  START SPIRONOLACTONE 25 MG 1/2 TABLET DAILY STARTING TOMORROW   TAKE HYDRALAZINE 25 MG 1 TABLET AS NEEDED FOR BLOOD PRESSURE ABOVE 150  Labwork: RENIN/ALDOSTERONE/BMET/CATACHOLAMINES/ METANEPHRINES IN 1 WEEK. THESE NEED TO BE DONE FIRST THING IN THE MORNING    Testing/Procedures: NONE  Follow-Up: 02/26/2023 3:00 pm with Pharm D   Any Other Special Instructions Will Be Listed Below (If Applicable). MONITOR YOUR BLOOD PRESSURE TWICE A DAY, LOG IN THE BOOK PROVIDED. BRING THE BOOK AND YOUR BLOOD PRESSURE MACHINE TO YOUR FOLLOW UP IN 1 MONTH   If you need a refill on your cardiac medications before your next appointment, please call your pharmacy.

## 2023-01-20 NOTE — Assessment & Plan Note (Addendum)
BP is uncontrolled and mostly elevated.  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  BP increased from 178/82 laying to 205/84 standing.  She has been taking higher doses of her ARB than recommended.  She has been taking losartan 100 mg twice daily.  She also takes captopril on top of that.  We again discussed the importance of not taking both an ACE inhibitor and an ARB.  Given that she seems to prefer the ACE inhibitor, I recommend that we switch to lisinopril 40 mg daily.  We will stop the losartan and she will stop taking captopril.  She is very concerned about having something to take as a rescue.  I will give her 25 mg of hydralazine to take as needed for SBP greater than 150.  She is not taking amlodipine due to swelling.  Will take this off her medication list.  She takes metoprolol which may be helping with anxiety though I doubt it does much for her blood pressure.  We will add spironolactone 12.5 mg daily.   Check renin, aldosterone, catecholamines, metanephrines, and BMP in one week.

## 2023-01-20 NOTE — Progress Notes (Signed)
Advanced Hypertension Clinic Initial Assessment:    Date:  01/20/2023   ID:  Lynn, [name] [birth year], MRN [id number]  PCP:  Ardith Dark, MD  Cardiologist:  Donato Schultz, MD  Nephrologist:  Referring MD: Ardith Dark, MD   CC: Hypertension  History of Present Illness:    Lynn [name] is a 82 y.o. [place] female with a hx of hypertension, pre-diabetes, moderate cognitive impairment, and hyperlipidemia here to establish care in the Advanced Hypertension Clinic.   She is here today with her daughter who serves as her interpreter.  She first saw Dr. Anne Fu for hypertension in 2020.  At that time she was taking metoprolol and losartan.  She had intermittent episodes of higher blood pressures and headaches.  He added HCTZ 12.5 mg daily to her regimen.  He noted that anxiety was likely contributing to her intermittently elevated blood pressures.  HCTZ was subsequently discontinued.   Prior to that she was seen in the ED for lightheadedness.  Family reported that he thought it was due to her blood pressure medications.  Family members were giving her both an ACE inhibitor and an ARB because they felt that it was controlling her blood pressure better than what the doctor had prescribed.  They also intermittently gave her amlodipine.  They noted that she has lightheadedness when she is tries to stand and ambulate.  In the ED her blood pressure was 139/53.  She was hyponatremic to 129 and hypokalemic to 3.4.  She was given IV fluids and discharged home.  MRI was negative for stroke.  She followed up with Dr. Anne Fu the following week.  At that visit she had amlodipine, losartan, valsartan, HCTZ, and metoprolol listed as active.  She followed up with Dr. Jimmey Ralph the following month and was noted to be on losartan 50 mg twice daily, amlodipine 2.5 mg daily, and metoprolol tartrate 25 mg twice daily.  Blood pressures continue to be very labile and her daughter requested  referral to the advanced hypertension clinic.  They noted that she also continues to struggle with anxiety.  Renal artery Dopplers 09/2022 revealed normal flow bilaterally.  The patient's chief complaint is uncontrolled hypertension with blood pressure readings varying throughout the day, with a significant increase in the evening. The patient reports experiencing heaviness in her eyes and head when her blood pressure is elevated. She also describes a lack of energy and weakness.  The patient has a history of switching between losartan and valsartan for blood pressure management, with losartan being more effective. However, her blood pressure remains uncontrolled despite adjustments in medication dosages. The patient has been using captopril as an emergency measure to lower her blood pressure, which provides relief within one to two hours, but she is aware that it is not a long-term solution.  The patient experiences shortness of breath at times, unrelated to her blood pressure levels. She denies any chest pain or pressure. She also reports constant swelling in her legs and feet, which does not improve with elevation. She does not exercise due to back pain. The patient denies any consumption of caffeine, alcohol, or cigarettes and maintains a healthy diet with minimal salt intake.  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 No pertinent surgical history.  Current Medications: Current Meds  Medication Sig   aspirin EC 81 MG tablet Take 81 mg by mouth every other day. Swallow whole.   atorvastatin (LIPITOR) 10 MG tablet TAKE 1 TABLET (10 MG TOTAL) BY MOUTH DAILY AT  6 PM.   azelastine (ASTELIN) 0.1 % nasal spray Place 2 sprays into both nostrils 2 (two) times daily.   baclofen (LIORESAL) 10 MG tablet Take 0.5-1 tablets (5-10 mg total) by mouth 3 (three) times daily as needed for muscle spasms.   calcium carbonate (OS-CAL - DOSED IN MG OF ELEMENTAL CALCIUM) 1250 (500 Ca) MG tablet Take 1 tablet by mouth 3 (three) times a week.   diclofenac Sodium (VOLTAREN) 1 % GEL Apply 4 g topically 4 (four) times daily as needed.   gabapentin (NEURONTIN) 100 MG capsule Take 1 capsule (100 mg total) by mouth 3 (three) times daily.   hydrALAZINE (APRESOLINE) 25 MG tablet Take 1 tablet as needed for blood pressure above 150   hydrOXYzine (ATARAX) 10 MG tablet TAKE 1 TABLET BY MOUTH 3 TIMES DAILY AS NEEDED FOR ANXIETY.   ibuprofen (ADVIL,MOTRIN) 200 MG tablet Take 200 mg by mouth every 6 (six) hours as needed.   lisinopril (ZESTRIL) 40 MG tablet Take 1 tablet (40 mg total) by mouth daily.   meclizine (ANTIVERT) 25 MG tablet Take 1 tablet (25 mg total) by mouth 3 (three) times daily as needed for dizziness.   metoprolol tartrate (LOPRESSOR) 25 MG tablet TAKE 1 TABLET BY MOUTH TWICE A DAY   Multiple Vitamin (MULTIVITAMIN) tablet Take 1 tablet by mouth 2 (two) times a week.   Pyridoxine HCl (VITAMIN B-6 PO) Take by mouth.   spironolactone (ALDACTONE) 25 MG tablet TAKE 1/2 TABLET DAILY   Thiamine HCl (VITAMIN B-1 PO) Take by mouth.   [DISCONTINUED] amLODipine (NORVASC) 2.5 MG tablet Take 1 tablet (2.5 mg total) by mouth daily. (Patient taking differently: Take 2.5 mg by mouth as needed.)   [DISCONTINUED] losartan (COZAAR) 100 MG tablet Take 1 tablet (100 mg total) by mouth daily. (Patient taking differently: Take 100 mg by mouth 2 (two) times daily.)   [DISCONTINUED] losartan (COZAAR) 100 MG tablet Take 100 mg by mouth 2 (two) times daily.     Allergies:   Norvasc [amlodipine]   Social History   Socioeconomic History   Marital status: Widowed    Spouse name: Not on file    Number of children: 2   Years of education: some college   Highest education level: Not on file  Occupational History   Occupation: Retired  Tobacco Use   Smoking status: Never   Smokeless tobacco: Never  Vaping Use   Vaping Use: Never used  Substance and Sexual Activity   Alcohol use: No   Drug use: No   Sexual activity: Not Currently  Other Topics Concern   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; clerical work for office   Social Determinants of Corporate investment banker Strain: Not on file  Food Insecurity: No Food Insecurity (01/20/2023)   Hunger Vital Sign    Worried About Running Out of Food in the Last Year: Never true  Ran Out of Food in the Last Year: Never true  Transportation Needs: No Transportation Needs (01/20/2023)   PRAPARE - Administrator, Civil Service (Medical): No    Lack of Transportation (Non-Medical): No  Physical Activity: Inactive (01/20/2023)   Exercise Vital Sign    Days of Exercise per Week: 0 days    Minutes of Exercise per Session: 0 min  Stress: Not on file  Social Connections: Not on file     Family History: The patient's family history includes Diabetes in her mother; Diverticulitis in her mother; Heart attack in her father and sister; Hypertension in her sister; Other in her father; Stroke in her sister.  ROS:   Please see the history of present illness.     All other systems reviewed and are negative.  EKGs/Labs/Other Studies Reviewed:    EKG:  EKG is not ordered today.   Recent Labs: 09/02/2022: ALT 16; TSH 2.30 09/26/2022: BUN 16; Creatinine, Ser 0.82; Hemoglobin 12.7; Platelets 343; Potassium 3.4; Sodium 129   Recent Lipid Panel    Component Value Date/Time   CHOL 143 07/18/2022 1230   CHOL 119  12/02/2019 0922   TRIG 82.0 07/18/2022 1230   HDL 60.60 07/18/2022 1230   HDL 50 12/02/2019 0922   CHOLHDL 2 07/18/2022 1230   VLDL 16.4 07/18/2022 1230   LDLCALC 66 07/18/2022 1230   LDLCALC 53 12/02/2019 0922    Physical Exam:   VS:  BP (!) 174/69 (BP Location: Right Arm, Patient Position: Sitting, Cuff Size: Normal)   Pulse (!) 59   Ht 4\' 9"  (1.448 m)   Wt 124 lb 11.2 oz (56.6 kg)   SpO2 97%   BMI 26.98 kg/m  , BMI Body mass index is 26.98 kg/m. GENERAL:  Well appearing HEENT: Pupils equal round and reactive, fundi not visualized, oral mucosa unremarkable NECK:  No jugular venous distention, waveform within normal limits, carotid upstroke brisk and symmetric, no bruits, no thyromegaly LUNGS:  Clear to auscultation bilaterally HEART:  RRR.  PMI not displaced or sustained,S1 and S2 within normal limits, no S3, no S4, no clicks, no rubs, I/VI systolic murmur at the LUSB ABD:  Flat, positive bowel sounds normal in frequency in pitch, no bruits, no rebound, no guarding, no midline pulsatile mass, no hepatomegaly, no splenomegaly EXT:  2 plus pulses throughout, no edema, no cyanosis no clubbing SKIN:  No rashes no nodules NEURO:  Cranial nerves II through XII grossly intact, motor grossly intact throughout PSYCH:  Cognitively intact, oriented to person place and time   ASSESSMENT/PLAN:    Hypertension BP is uncontrolled and mostly elevated.  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  BP increased from 178/82 laying to 205/84 standing.  She has been taking higher doses of her ARB than recommended.  She has been taking losartan 100 mg twice daily.  She also takes captopril on top of that.  We again discussed the importance of not taking both an ACE inhibitor and an ARB.  Given that she seems to prefer the ACE inhibitor, I recommend that we switch to lisinopril 40 mg daily.  We will stop the losartan and she will stop taking captopril.  She is very concerned about having something to take  as a rescue.  I will give her 25 mg of hydralazine to take as needed for SBP greater than 150.  She is not taking amlodipine due to swelling.  Will take this off her medication list.  She takes metoprolol which may  be helping with anxiety though I doubt it does much for her blood pressure.  We will add spironolactone 12.5 mg daily.   Check renin, aldosterone, catecholamines, metanephrines, and BMP in one week.   Hyperlipidemia Continue atorvastatin.    Screening for Secondary Hypertension:     01/20/2023    4:13 PM  Causes  Drugs/Herbals Screened     - Comments No tobacco,no EtOH, limits salt  Renovascular HTN N/A  Sleep Apnea Screened     - Comments no snoring  Thyroid Disease Screened    Relevant Labs/Studies:    Latest Ref Rng & Units 09/26/2022    3:40 PM 09/02/2022    2:46 PM 07/28/2022   12:02 PM  Basic Labs  Sodium 135 - 145 mmol/L 129  137  135   Potassium 3.5 - 5.1 mmol/L 3.4  4.7  5.3 No hemolysis seen   Creatinine 0.44 - 1.00 mg/dL 1.61  0.96  0.45        Latest Ref Rng & Units 09/02/2022    2:46 PM 07/18/2022   12:30 PM  Thyroid   TSH 0.35 - 5.50 uIU/mL 2.30  1.32                 09/26/2022   11:02 AM  Renovascular   Renal Artery [place] Completed Yes     Disposition:    FU with MD/PharmD in 1 month    Medication Adjustments/Labs and Tests Ordered: Current medicines are reviewed at length with the patient today.  Concerns regarding medicines are outlined above.  Orders Placed This Encounter  Procedures   Metanephrines, plasma   Catecholamines, fractionated, plasma   Aldosterone + renin activity w/ ratio   Basic metabolic panel   Meds ordered this encounter  Medications   spironolactone (ALDACTONE) 25 MG tablet    Sig: TAKE 1/2 TABLET DAILY    Dispense:  45 tablet    Refill:  3   lisinopril (ZESTRIL) 40 MG tablet    Sig: Take 1 tablet (40 mg total) by mouth daily.    Dispense:  90 tablet    Refill:  3    D/C LOSARTAN   hydrALAZINE (APRESOLINE) 25  MG tablet    Sig: Take 1 tablet as needed for blood pressure above 150    Dispense:  30 tablet    Refill:  1     Signed, Chilton Si, MD  01/20/2023 4:47 PM    [facility]

## 2023-01-20 NOTE — Assessment & Plan Note (Signed)
Continue atorvastatin

## 2023-01-27 ENCOUNTER — Encounter: Payer: Self-pay | Admitting: Family Medicine

## 2023-01-27 ENCOUNTER — Ambulatory Visit (INDEPENDENT_AMBULATORY_CARE_PROVIDER_SITE_OTHER): Payer: Medicare Other | Admitting: Family Medicine

## 2023-01-27 ENCOUNTER — Telehealth: Payer: Self-pay | Admitting: Family Medicine

## 2023-01-27 VITALS — BP 139/79 | HR 58 | Temp 97.5°F | Ht <= 58 in | Wt 121.6 lb

## 2023-01-27 DIAGNOSIS — F419 Anxiety disorder, unspecified: Secondary | ICD-10-CM | POA: Diagnosis not present

## 2023-01-27 DIAGNOSIS — J392 Other diseases of pharynx: Secondary | ICD-10-CM | POA: Diagnosis not present

## 2023-01-27 DIAGNOSIS — I10 Essential (primary) hypertension: Secondary | ICD-10-CM | POA: Diagnosis not present

## 2023-01-27 LAB — POCT RAPID STREP A (OFFICE): Rapid Strep A Screen: POSITIVE — AB

## 2023-01-27 MED ORDER — AMOXICILLIN 500 MG PO CAPS: 500.0000 mg | ORAL_CAPSULE | Freq: Two times a day (BID) | ORAL | 0 refills | Status: DC

## 2023-01-27 MED ORDER — BUSPIRONE HCL 5 MG PO TABS: 5.0000 mg | ORAL_TABLET | Freq: Two times a day (BID) | ORAL | 5 refills | Status: DC

## 2023-01-27 NOTE — Patient Instructions (Addendum)
It was very nice to see you today!  Your strep test was positive. Please start the amoxicillin. Let us know if not improving.   Return if symptoms worsen or fail to improve.   Take care, Dr Jimmey Ralph  PLEASE NOTE:  If you had any lab tests, please let us know if you have not heard back within a few days. You may see your results on mychart before we have a chance to review them but we will give you a call once they are reviewed by [place].   If we ordered any referrals today, please let us know if you have not heard from their office within the next week.   If you had any urgent prescriptions sent in today, please check with the pharmacy within an hour of our visit to make sure the prescription was transmitted appropriately.   Please try these tips to maintain a healthy lifestyle:  Eat at least 3 REAL meals and 1-2 snacks per day.  Aim for no more than 5 hours between eating.  If you eat breakfast, please do so within one hour of getting up.   Each meal should contain half fruits/vegetables, one quarter protein, and one quarter carbs (no bigger than a computer mouse)  Cut down on sweet beverages. This includes juice, soda, and sweet tea.   Drink at least 1 glass of water with each meal and aim for at least 8 glasses per day  Exercise at least 150 minutes every week.

## 2023-01-27 NOTE — Progress Notes (Signed)
   Lynn [name] is a 82 y.o. female who presents today for an office visit.  Assessment/Plan:  New/Acute Problems: Sore Throat Rapid strep positive. Will start amoxicillin 500 mg twice daily for 10 days. Encouraged hydration. She will let us know if not improving. Will refer to ENT if symptoms persist.   Chronic Problems Addressed Today: Hypertension Blood pressure at goal today.  Recently saw hypertension clinic and had medications adjusted.  She is doing well current regimen spironolactone 12.5 mg daily, metoprolol tartrate 25 mg daily, lisinopril 40 mg daily, and hydralazine 25 mg as needed for systolic blood pressure greater than 150.  She has workup pending for secondary causes of hypertension.  Anxiety Still has ongoing issues with anxiety.  She did not feel like hydroxyzine was particularly effective.  Daughters wishing to try alternative medication.  Will try BuSpar 5 mg twice daily.  If this continues to be an issue would consider referral to psychiatry.  Will try to avoid benzos at this point given her advanced age.     Subjective:  HPI:  See Ap for status of chronic conditions. Patient is here today with her daughter.  She has been having an itchy and scratchy throat for the last month or so. Initially thought that is was due to allergies.  Some hoarse voice. Tried taking allergy medication without much of an improvements. Getting worse. No pain. No mucus or sputum production. No heart burn or reflux.        Objective:  Physical Exam: BP 139/79   Pulse (!) 58   Temp (!) 97.5 F (36.4 C) (Temporal)   Ht 4\' 9"  (1.448 m)   Wt 121 lb 9.6 oz (55.2 kg)   SpO2 97%   BMI 26.31 kg/m   Gen: No acute distress, resting comfortably HEENT: Left TM with chronic perforation.  Right TM clear.  OP slightly erythematous. CV: Regular rate and rhythm with no murmurs appreciated Pulm: Normal work of breathing, clear to auscultation bilaterally with no crackles, wheezes, or  rhonchi Neuro: Grossly normal, moves all extremities Psych: Normal affect and thought content      Lina Hitch M. Jimmey Ralph, MD 01/27/2023 11:21 AM

## 2023-01-27 NOTE — Assessment & Plan Note (Signed)
Still has ongoing issues with anxiety.  She did not feel like hydroxyzine was particularly effective.  Daughters wishing to try alternative medication.  Will try BuSpar 5 mg twice daily.  If this continues to be an issue would consider referral to psychiatry.  Will try to avoid benzos at this point given her advanced age.

## 2023-01-27 NOTE — Assessment & Plan Note (Signed)
Blood pressure at goal today.  Recently saw hypertension clinic and had medications adjusted.  She is doing well current regimen spironolactone 12.5 mg daily, metoprolol tartrate 25 mg daily, lisinopril 40 mg daily, and hydralazine 25 mg as needed for systolic blood pressure greater than 150.  She has workup pending for secondary causes of hypertension.

## 2023-01-27 NOTE — Telephone Encounter (Signed)
Pt called and states she think the antibiotic that was RX is too strong for pt because she vomited the meds after an hour of taking it. Please advise.

## 2023-01-29 NOTE — Telephone Encounter (Signed)
She is already on a low dose.   We can send in a zpack instead.  Katina Degree. Jimmey Ralph, MD 01/29/2023 12:52 PM

## 2023-01-29 NOTE — Telephone Encounter (Signed)
Spoke with patient, vomiting and not feeling good over all after taking Rx amoxicillin  Requesting a lower dose if possible or medication changes  Please advise

## 2023-01-29 NOTE — Telephone Encounter (Signed)
Caller is calling for an update. Requests callback ASAP.

## 2023-01-30 ENCOUNTER — Other Ambulatory Visit: Payer: Self-pay | Admitting: *Deleted

## 2023-01-30 MED ORDER — AZITHROMYCIN 250 MG PO TABS: ORAL_TABLET | ORAL | 0 refills | Status: AC

## 2023-01-30 NOTE — Telephone Encounter (Signed)
Patient stated stop taking Rx amoxicillin Ok to send Zpack, Rx send to pharmacy

## 2023-02-03 ENCOUNTER — Telehealth: Payer: Self-pay | Admitting: Cardiology

## 2023-02-03 DIAGNOSIS — E785 Hyperlipidemia, unspecified: Secondary | ICD-10-CM

## 2023-02-03 DIAGNOSIS — R7303 Prediabetes: Secondary | ICD-10-CM

## 2023-02-03 DIAGNOSIS — E875 Hyperkalemia: Secondary | ICD-10-CM

## 2023-02-03 MED ORDER — LOKELMA 10 G PO PACK: 10.0000 g | PACK | Freq: Once | ORAL | 0 refills | Status: AC

## 2023-02-03 NOTE — Telephone Encounter (Signed)
Patient had a OV with Dr. Duke Salvia on 4/30.  Mary from [facility] called reporting stat potassium of 6.4, it was not noted of any hemolysis , there are some other lab results that are pending. Lab results retrieved and sent to [facility] queues. MD was notified of test results.

## 2023-02-03 NOTE — Telephone Encounter (Signed)
Per Dr. Duke Salvia, stop spironolactone, hold lisinopril x2 days and do one packet of Lokelma. Will offer patient sample packet of lokelma. BMP in one week.   Called patient and provided the above information, she will come pick up the lokelma today. Asked if we could add on a Lipid panel and A1c to those labs, verbal ok given by Dr. Duke Salvia. Sample order placed and logged, labs up front with sample.

## 2023-02-03 NOTE — Telephone Encounter (Signed)
Calling with a lab alert

## 2023-02-03 NOTE — Addendum Note (Signed)
Addended by: Marlene Lard on: 02/03/2023 09:27 AM   Modules accepted: Orders

## 2023-02-03 NOTE — Telephone Encounter (Signed)
Printed for review with Dr. Duke Salvia, will call patient with her recommendations.

## 2023-02-05 ENCOUNTER — Encounter (HOSPITAL_BASED_OUTPATIENT_CLINIC_OR_DEPARTMENT_OTHER): Payer: Self-pay

## 2023-02-05 ENCOUNTER — Other Ambulatory Visit: Payer: Self-pay

## 2023-02-05 ENCOUNTER — Inpatient Hospital Stay (HOSPITAL_BASED_OUTPATIENT_CLINIC_OR_DEPARTMENT_OTHER)
Admission: EM | Admit: 2023-02-05 | Discharge: 2023-02-07 | DRG: 641 | Disposition: A | Discharge status: Home / Self Care | Payer: Medicare Other | Attending: Family Medicine | Admitting: Family Medicine

## 2023-02-05 DIAGNOSIS — F419 Anxiety disorder, unspecified: Secondary | ICD-10-CM | POA: Diagnosis present

## 2023-02-05 DIAGNOSIS — R001 Bradycardia, unspecified: Secondary | ICD-10-CM | POA: Diagnosis present

## 2023-02-05 DIAGNOSIS — Z7982 Long term (current) use of aspirin: Secondary | ICD-10-CM

## 2023-02-05 DIAGNOSIS — Z888 Allergy status to other drugs, medicaments and biological substances status: Secondary | ICD-10-CM | POA: Diagnosis not present

## 2023-02-05 DIAGNOSIS — F03B4 Unspecified dementia, moderate, with anxiety: Secondary | ICD-10-CM | POA: Diagnosis present

## 2023-02-05 DIAGNOSIS — E785 Hyperlipidemia, unspecified: Secondary | ICD-10-CM | POA: Diagnosis not present

## 2023-02-05 DIAGNOSIS — E871 Hypo-osmolality and hyponatremia: Principal | ICD-10-CM | POA: Diagnosis present

## 2023-02-05 DIAGNOSIS — R609 Edema, unspecified: Secondary | ICD-10-CM | POA: Diagnosis not present

## 2023-02-05 DIAGNOSIS — E861 Hypovolemia: Secondary | ICD-10-CM | POA: Diagnosis present

## 2023-02-05 DIAGNOSIS — E860 Dehydration: Secondary | ICD-10-CM | POA: Diagnosis present

## 2023-02-05 DIAGNOSIS — Z603 Acculturation difficulty: Secondary | ICD-10-CM | POA: Diagnosis not present

## 2023-02-05 DIAGNOSIS — Z79899 Other long term (current) drug therapy: Secondary | ICD-10-CM

## 2023-02-05 DIAGNOSIS — R531 Weakness: Secondary | ICD-10-CM

## 2023-02-05 DIAGNOSIS — R112 Nausea with vomiting, unspecified: Secondary | ICD-10-CM | POA: Diagnosis not present

## 2023-02-05 DIAGNOSIS — R600 Localized edema: Secondary | ICD-10-CM | POA: Diagnosis present

## 2023-02-05 DIAGNOSIS — I10 Essential (primary) hypertension: Secondary | ICD-10-CM | POA: Diagnosis not present

## 2023-02-05 DIAGNOSIS — F03B0 Unspecified dementia, moderate, without behavioral disturbance, psychotic disturbance, mood disturbance, and anxiety: Secondary | ICD-10-CM | POA: Diagnosis present

## 2023-02-05 DIAGNOSIS — R110 Nausea: Secondary | ICD-10-CM

## 2023-02-05 LAB — BASIC METABOLIC PANEL
Anion gap: 8 (ref 5–15)
BUN: 12 mg/dL (ref 8–23)
CO2: 23 mmol/L (ref 22–32)
Calcium: 8.1 mg/dL — ABNORMAL LOW (ref 8.9–10.3)
Chloride: 92 mmol/L — ABNORMAL LOW (ref 98–111)
Creatinine, Ser: 0.64 mg/dL (ref 0.44–1.00)
GFR, Estimated: >60 mL/min (ref >60)
Glucose, Bld: 112 mg/dL — ABNORMAL HIGH (ref 70–99)
Potassium: 3.7 mmol/L (ref 3.5–5.1)
Sodium: 123 mmol/L — ABNORMAL LOW (ref 135–145)

## 2023-02-05 LAB — COMPREHENSIVE METABOLIC PANEL
ALT: 18 U/L (ref 0–44)
AST: 23 U/L (ref 15–41)
Albumin: 4 g/dL (ref 3.5–5.0)
Alkaline Phosphatase: 59 U/L (ref 38–126)
Anion gap: 7 (ref 5–15)
BUN: 14 mg/dL (ref 8–23)
CO2: 29 mmol/L (ref 22–32)
Calcium: 8.8 mg/dL — ABNORMAL LOW (ref 8.9–10.3)
Chloride: 85 mmol/L — ABNORMAL LOW (ref 98–111)
Creatinine, Ser: 0.81 mg/dL (ref 0.44–1.00)
GFR, Estimated: >60 mL/min (ref >60)
Glucose, Bld: 141 mg/dL — ABNORMAL HIGH (ref 70–99)
Potassium: 3.7 mmol/L (ref 3.5–5.1)
Sodium: 121 mmol/L — ABNORMAL LOW (ref 135–145)
Total Bilirubin: 0.5 mg/dL (ref 0.3–1.2)
Total Protein: 6.8 g/dL (ref 6.5–8.1)

## 2023-02-05 LAB — CBC
HCT: 35.2 % — ABNORMAL LOW (ref 36.0–46.0)
Hemoglobin: 12.3 g/dL (ref 12.0–15.0)
MCH: 30 pg (ref 26.0–34.0)
MCHC: 34.9 g/dL (ref 30.0–36.0)
MCV: 85.9 fL (ref 80.0–100.0)
Platelets: 295 10*3/uL (ref 150–400)
RBC: 4.1 MIL/uL (ref 3.87–5.11)
RDW: 12.1 % (ref 11.5–15.5)
WBC: 7.1 10*3/uL (ref 4.0–10.5)
nRBC: 0 % (ref 0.0–0.2)

## 2023-02-05 LAB — URINALYSIS, ROUTINE W REFLEX MICROSCOPIC
Bilirubin Urine: NEGATIVE
Glucose, UA: NEGATIVE mg/dL
Hgb urine dipstick: NEGATIVE
Ketones, ur: NEGATIVE mg/dL
Leukocytes,Ua: NEGATIVE
Nitrite: NEGATIVE
Protein, ur: NEGATIVE mg/dL
Specific Gravity, Urine: 1.005 (ref 1.005–1.030)
pH: 7 (ref 5.0–8.0)

## 2023-02-05 LAB — TSH: TSH: 2.632 u[IU]/mL (ref 0.350–4.500)

## 2023-02-05 LAB — TROPONIN I (HIGH SENSITIVITY): Troponin I (High Sensitivity): 4 ng/L (ref <18)

## 2023-02-05 LAB — SODIUM, URINE, RANDOM: Sodium, Ur: 51 mmol/L

## 2023-02-05 MED ORDER — GABAPENTIN 100 MG PO CAPS
100.0000 mg | ORAL_CAPSULE | Freq: Three times a day (TID) | ORAL | Status: DC
  Administered 2023-02-05 – 2023-02-07 (×5): 100 mg via ORAL
  Filled 2023-02-05 (×5): qty 1

## 2023-02-05 MED ORDER — BUSPIRONE HCL 5 MG PO TABS
5.0000 mg | ORAL_TABLET | Freq: Two times a day (BID) | ORAL | Status: DC
  Administered 2023-02-05 – 2023-02-07 (×4): 5 mg via ORAL
  Filled 2023-02-05 (×4): qty 1

## 2023-02-05 MED ORDER — ATORVASTATIN CALCIUM 10 MG PO TABS
10.0000 mg | ORAL_TABLET | Freq: Every day | ORAL | Status: DC
  Administered 2023-02-06: 10 mg via ORAL
  Filled 2023-02-05: qty 1

## 2023-02-05 MED ORDER — ONDANSETRON HCL 4 MG PO TABS: 4.0000 mg | ORAL_TABLET | Freq: Four times a day (QID) | ORAL | Status: DC | PRN

## 2023-02-05 MED ORDER — HYDROXYZINE HCL 10 MG PO TABS
10.0000 mg | ORAL_TABLET | Freq: Once | ORAL | Status: AC
  Administered 2023-02-05: 10 mg via ORAL
  Filled 2023-02-05: qty 1

## 2023-02-05 MED ORDER — LISINOPRIL 20 MG PO TABS
40.0000 mg | ORAL_TABLET | Freq: Every day | ORAL | Status: DC
  Administered 2023-02-06 – 2023-02-07 (×2): 40 mg via ORAL
  Filled 2023-02-05 (×2): qty 2

## 2023-02-05 MED ORDER — ACETAMINOPHEN 650 MG RE SUPP: 650.0000 mg | Freq: Four times a day (QID) | RECTAL | Status: DC | PRN

## 2023-02-05 MED ORDER — MECLIZINE HCL 25 MG PO TABS: 25.0000 mg | ORAL_TABLET | Freq: Three times a day (TID) | ORAL | Status: DC | PRN

## 2023-02-05 MED ORDER — ACETAMINOPHEN 325 MG PO TABS: 650.0000 mg | ORAL_TABLET | Freq: Four times a day (QID) | ORAL | Status: DC | PRN

## 2023-02-05 MED ORDER — POTASSIUM CHLORIDE IN NACL 20-0.9 MEQ/L-% IV SOLN
INTRAVENOUS | Status: DC
  Filled 2023-02-05 (×3): qty 1000

## 2023-02-05 MED ORDER — ASPIRIN 81 MG PO TBEC
81.0000 mg | DELAYED_RELEASE_TABLET | ORAL | Status: DC
  Administered 2023-02-06: 81 mg via ORAL
  Filled 2023-02-05: qty 1

## 2023-02-05 MED ORDER — METOPROLOL TARTRATE 25 MG PO TABS: 25.0000 mg | ORAL_TABLET | Freq: Two times a day (BID) | ORAL | Status: DC

## 2023-02-05 MED ORDER — ACETAMINOPHEN 500 MG PO TABS
1000.0000 mg | ORAL_TABLET | Freq: Once | ORAL | Status: AC
  Administered 2023-02-05: 1000 mg via ORAL
  Filled 2023-02-05: qty 2

## 2023-02-05 MED ORDER — ONDANSETRON HCL 4 MG/2ML IJ SOLN: 4.0000 mg | Freq: Four times a day (QID) | INTRAMUSCULAR | Status: DC | PRN

## 2023-02-05 MED ORDER — SODIUM CHLORIDE 0.9 % IV BOLUS
1000.0000 mL | Freq: Once | INTRAVENOUS | Status: AC
  Administered 2023-02-05: 1000 mL via INTRAVENOUS

## 2023-02-05 MED ORDER — ENOXAPARIN SODIUM 40 MG/0.4ML IJ SOSY
40.0000 mg | PREFILLED_SYRINGE | Freq: Every day | INTRAMUSCULAR | Status: DC
  Administered 2023-02-05 – 2023-02-06 (×2): 40 mg via SUBCUTANEOUS
  Filled 2023-02-05 (×2): qty 0.4

## 2023-02-05 NOTE — H&P (Signed)
History and Physical    Lynn [name] WUJ:[id number] DOB: 08/05/1941 DOA: 02/05/2023  PCP: Ardith Dark, MD  Patient coming from: Home  I have personally briefly reviewed patient's old medical records in [facility]  Chief Complaint: Weakness, lethargy nausea vomiting  HPI: Lynn [name] is a 82 y.o. female with medical history significant of hypertension, bradycardia, hyperlipidemia and mild cognitive disorder who was brought to the [facility] ED by family due to 4 days history of lethargy and intermittent nausea vomiting.  No other complaint.  Patient denies any abdominal pain, fever, chills, chest pain, shortness of breath, any problem with urination or with bowel movements.  Per family, patient was recently started on lisinopril for hypertension.  Previously she was on losartan for 10 years.  She was also treated with antibiotic for strep throat lately.  No other changes in medications  ED Course: Upon arrival to ED, she was hemodynamically stable with mild bradycardia which she has history of.  She was found to have acute hyponatremia with sodium of 121.  She received 1 L of IV fluid bolus in the ED and was started on 75 mill per hour.  Hospitalist were consulted for admission.  Review of Systems: As per HPI otherwise negative.    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 No pertinent surgical history.   reports that she has never smoked. She has never used smokeless tobacco. She reports that she does not drink alcohol and does not use drugs.  Allergies  Allergen Reactions   Norvasc [Amlodipine]     SWELLING     Family History  Problem Relation Age of Onset   Diverticulitis Mother    Diabetes Mother    Heart attack Father    Other Father         unsure   Heart attack Sister    Hypertension Sister    Stroke Sister     Prior to Admission medications   Medication Sig Start Date End Date Taking? Authorizing Provider  amoxicillin (AMOXIL) 500 MG capsule Take 1 capsule (500 mg total) by mouth 2 (two) times daily for 10 days. 01/27/23 02/06/23  Ardith Dark, MD  aspirin EC 81 MG tablet Take 81 mg by mouth every other day. Swallow whole.    [provider]  atorvastatin (LIPITOR) 10 MG tablet TAKE 1 TABLET (10 MG TOTAL) BY MOUTH DAILY AT 6 PM. 11/17/22   Ardith Dark, MD  azelastine (ASTELIN) 0.1 % nasal spray Place 2 sprays into both nostrils 2 (two) times daily. 10/23/21   Ardith Dark, MD  baclofen (LIORESAL) 10 MG tablet Take 0.5-1 tablets (5-10 mg total) by mouth 3 (three) times daily as needed for muscle spasms. 12/06/21   Persons, [place], PA  busPIRone (BUSPAR) 5 MG tablet Take 1 tablet (5 mg total) by mouth 2 (two) times daily. 01/27/23   Ardith Dark, MD  calcium carbonate (OS-CAL - DOSED IN MG OF ELEMENTAL CALCIUM) 1250 (500 Ca) MG tablet Take 1 tablet by mouth 3 (three) times a week.    [provider]  diclofenac Sodium (VOLTAREN) 1 % GEL Apply 4 g topically 4 (four) times daily as needed. 05/21/22   Persons, [place], PA  gabapentin (NEURONTIN)  100 MG capsule Take 1 capsule (100 mg total) by mouth 3 (three) times daily. 12/25/22   Juanda Chance, NP  hydrALAZINE (APRESOLINE) 25 MG tablet Take 1 tablet as needed for blood pressure above 150 01/20/23   Chilton Si, MD  hydrOXYzine (ATARAX) 10 MG tablet TAKE 1 TABLET BY MOUTH 3 TIMES DAILY AS NEEDED FOR ANXIETY. 11/21/22   Ardith Dark, MD  ibuprofen (ADVIL,MOTRIN) 200 MG tablet Take 200 mg by mouth every 6 (six) hours as needed.    [provider]  lisinopril (ZESTRIL) 40 MG tablet Take 1 tablet (40 mg total) by mouth daily. 01/20/23 04/20/23  Chilton Si, MD  meclizine (ANTIVERT) 25 MG tablet Take 1 tablet (25 mg total) by mouth 3 (three)  times daily as needed for dizziness. 09/26/22   Melene Plan, DO  metoprolol tartrate (LOPRESSOR) 25 MG tablet TAKE 1 TABLET BY MOUTH TWICE A DAY 12/15/22   Ardith Dark, MD  Multiple Vitamin (MULTIVITAMIN) tablet Take 1 tablet by mouth 2 (two) times a week.    [provider]  Pyridoxine HCl (VITAMIN B-6 PO) Take by mouth.    [provider]  Thiamine HCl (VITAMIN B-1 PO) Take by mouth.    [provider]    Physical Exam: Vitals:   02/05/23 1930 02/05/23 1945 02/05/23 2032 02/05/23 2058  BP:  (!) 152/73 (!) 155/61   Pulse:  65 62   Resp:  19 19   Temp: 98.7 F (37.1 C)  97.9 F (36.6 C)   TempSrc: Oral  Oral   SpO2:  98% 97%   Weight:    56 kg  Height:    4\' 9"  (1.448 m)    Constitutional: NAD, calm, comfortable Vitals:   02/05/23 1930 02/05/23 1945 02/05/23 2032 02/05/23 2058  BP:  (!) 152/73 (!) 155/61   Pulse:  65 62   Resp:  19 19   Temp: 98.7 F (37.1 C)  97.9 F (36.6 C)   TempSrc: Oral  Oral   SpO2:  98% 97%   Weight:    56 kg  Height:    4\' 9"  (1.448 m)   Eyes: PERRL, lids and conjunctivae normal ENMT: Mucous membranes are moist. Posterior pharynx clear of any exudate or lesions.Normal dentition.  Neck: normal, supple, no masses, no thyromegaly Respiratory: clear to auscultation bilaterally, no wheezing, no crackles. Normal respiratory effort. No accessory muscle use.  Cardiovascular: Regular rate and rhythm, no murmurs / rubs / gallops. No extremity edema. 2+ pedal pulses. No carotid bruits.  Abdomen: no tenderness, no masses palpated. No hepatosplenomegaly. Bowel sounds positive.  Musculoskeletal: no clubbing / cyanosis. No joint deformity upper and lower extremities. Good ROM, no contractures. Normal muscle tone.  Skin: no rashes, lesions, ulcers. No induration Neurologic: CN 2-12 grossly intact. Sensation intact, DTR normal. Strength 5/5 in all 4.  Psychiatric: Normal judgment and insight. Alert and oriented x 3. Normal mood.     Labs on Admission: I have personally reviewed following labs and imaging studies  CBC: Recent Labs  Lab 02/05/23 1547  WBC 7.1  HGB 12.3  HCT 35.2*  MCV 85.9  PLT 295   Basic Metabolic Panel: Recent Labs  Lab 02/05/23 1547  NA 121*  K 3.7  CL 85*  CO2 29  GLUCOSE 141*  BUN 14  CREATININE 0.81  CALCIUM 8.8*   GFR: Estimated Creatinine Clearance: 38.5 mL/min (by C-G formula based on SCr of 0.81 mg/dL). Liver Function Tests: Recent Labs  Lab 02/05/23 1547  AST 23  ALT 18  ALKPHOS 59  BILITOT 0.5  PROT 6.8  ALBUMIN 4.0   No results for input(s): "LIPASE", "AMYLASE" in the last 168 hours. No results for input(s): "AMMONIA" in the last 168 hours. Coagulation Profile: No results for input(s): "INR", "PROTIME" in the last 168 hours. Cardiac Enzymes: No results for input(s): "CKTOTAL", "CKMB", "CKMBINDEX", "TROPONINI" in the last 168 hours. BNP (last 3 results) No results for input(s): "PROBNP" in the last 8760 hours. HbA1C: No results for input(s): "HGBA1C" in the last 72 hours. CBG: No results for input(s): "GLUCAP" in the last 168 hours. Lipid Profile: No results for input(s): "CHOL", "HDL", "LDLCALC", "TRIG", "CHOLHDL", "LDLDIRECT" in the last 72 hours. Thyroid Function Tests: No results for input(s): "TSH", "T4TOTAL", "FREET4", "T3FREE", "THYROIDAB" in the last 72 hours. Anemia Panel: No results for input(s): "VITAMINB12", "FOLATE", "FERRITIN", "TIBC", "IRON", "RETICCTPCT" in the last 72 hours. Urine analysis:    Component Value Date/Time   COLORURINE STRAW (A) 02/05/2023 2026   APPEARANCEUR CLEAR 02/05/2023 2026   LABSPEC 1.005 02/05/2023 2026   PHURINE 7.0 02/05/2023 2026   GLUCOSEU NEGATIVE 02/05/2023 2026   GLUCOSEU NEGATIVE 07/18/2022 1230   HGBUR NEGATIVE 02/05/2023 2026   BILIRUBINUR NEGATIVE 02/05/2023 2026   BILIRUBINUR negative 07/19/2020 1047   KETONESUR NEGATIVE 02/05/2023 2026   PROTEINUR NEGATIVE 02/05/2023 2026   UROBILINOGEN 0.2  07/18/2022 1230   NITRITE NEGATIVE 02/05/2023 2026   LEUKOCYTESUR NEGATIVE 02/05/2023 2026    Radiological Exams on Admission: No results found.  EKG: Independently reviewed.  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  Based on the history of vomiting, this is likely hypovolemic hyponatremia.  Continue normal saline, repeat labs in the morning.  Serum osmolarity, urine osmolarity and serum sodium to check for possible SIADH.  Nausea and vomiting: Will treat symptomatically.  Currently she has no nausea.  Family is asking me to start her diet.  I will start her on full liquid diet for now.  Essential hypertension: Slightly elevated, resume home medications.  Hyperlipidemia: Resume atorvastatin.  Sinus bradycardia: Patient appears to be on metoprolol which I will hold for now.  DVT prophylaxis: enoxaparin (LOVENOX) injection 40 mg Start: 02/05/23 2230 Code Status: Full code Family Communication: Son present at bedside.  Plan of care discussed with patient in length and he verbalized understanding and agreed with it. Disposition Plan: Potential discharge in next 1 to 2 days Consults called: None  Hughie Closs MD [facility]  *Please note that this is a verbal dictation therefore any spelling or grammatical errors are due to the "Dragon [employer] One" system interpretation.  Please page via Amion and do not message via secure chat for urgent patient care matters. Secure chat can be used for non urgent patient care matters. 02/05/2023, 9:35 PM  To contact the attending provider between 7A-7P or the covering provider during after hours 7P-7A, please log into the web site [email or web address]

## 2023-02-05 NOTE — ED Notes (Signed)
Report called to Ladona Ridgel, nurse on accepting unit. [facility] here for patient transport.

## 2023-02-05 NOTE — ED Provider Notes (Signed)
[place] EMERGENCY DEPARTMENT AT [facility] Provider Note   CSN: [id number] Arrival date & time: 02/05/23  1448     History  Chief Complaint  Patient presents with   Weakness   Emesis    Lynn [name] is a 82 y.o. female.  Patient with c/o generally feeling weak today. Was nauseated earlier and indicates not able to eat/drink very much. No abd pain. No diarrhea. Mild frontal headache. No severe headaches. No cough, sore throat or uri symptoms (had recently completed course of antibiotic for strep throat). Is eating/drinking, although a bit less today. No dysuria or gu c/o. No chest pain. No sob. No extremity pain or swelling. No recent trauma/fall. No fainting. No focal numbness/weakness. No change in speech/vision.  Family also indicates pt concerned about her blood pressure, and also has issues with anxiety/stress.   The history is provided by the patient, medical records and a relative. A language interpreter was used.  Weakness Associated symptoms: nausea and vomiting   Associated symptoms: no abdominal pain, no chest pain, no cough, no dysuria, no fever, no headaches and no shortness of breath   Emesis Associated symptoms: no abdominal pain, no cough, no fever, no headaches and no sore throat        Home Medications Prior to Admission medications   Medication Sig Start Date End Date Taking? Authorizing Provider  amoxicillin (AMOXIL) 500 MG capsule Take 1 capsule (500 mg total) by mouth 2 (two) times daily for 10 days. 01/27/23 02/06/23  Ardith Dark, MD  aspirin EC 81 MG tablet Take 81 mg by mouth every other day. Swallow whole.    [provider]  atorvastatin (LIPITOR) 10 MG tablet TAKE 1 TABLET (10 MG TOTAL) BY MOUTH DAILY AT 6 PM. 11/17/22   Ardith Dark, MD  azelastine (ASTELIN) 0.1 % nasal spray Place 2 sprays into both nostrils 2 (two) times daily. 10/23/21   Ardith Dark, MD  baclofen (LIORESAL) 10 MG tablet Take 0.5-1 tablets  (5-10 mg total) by mouth 3 (three) times daily as needed for muscle spasms. 12/06/21   Persons, [place], PA  busPIRone (BUSPAR) 5 MG tablet Take 1 tablet (5 mg total) by mouth 2 (two) times daily. 01/27/23   Ardith Dark, MD  calcium carbonate (OS-CAL - DOSED IN MG OF ELEMENTAL CALCIUM) 1250 (500 Ca) MG tablet Take 1 tablet by mouth 3 (three) times a week.    [provider]  diclofenac Sodium (VOLTAREN) 1 % GEL Apply 4 g topically 4 (four) times daily as needed. 05/21/22   Persons, [place], PA  gabapentin (NEURONTIN) 100 MG capsule Take 1 capsule (100 mg total) by mouth 3 (three) times daily. 12/25/22   Juanda Chance, NP  hydrALAZINE (APRESOLINE) 25 MG tablet Take 1 tablet as needed for blood pressure above 150 01/20/23   Chilton Si, MD  hydrOXYzine (ATARAX) 10 MG tablet TAKE 1 TABLET BY MOUTH 3 TIMES DAILY AS NEEDED FOR ANXIETY. 11/21/22   Ardith Dark, MD  ibuprofen (ADVIL,MOTRIN) 200 MG tablet Take 200 mg by mouth every 6 (six) hours as needed.    [provider]  lisinopril (ZESTRIL) 40 MG tablet Take 1 tablet (40 mg total) by mouth daily. 01/20/23 04/20/23  Chilton Si, MD  meclizine (ANTIVERT) 25 MG tablet Take 1 tablet (25 mg total) by mouth 3 (three) times daily as needed for dizziness. 09/26/22   Melene Plan, DO  metoprolol tartrate (LOPRESSOR) 25 MG tablet TAKE 1  TABLET BY MOUTH TWICE A DAY 12/15/22   Ardith Dark, MD  Multiple Vitamin (MULTIVITAMIN) tablet Take 1 tablet by mouth 2 (two) times a week.    [provider]  Pyridoxine HCl (VITAMIN B-6 PO) Take by mouth.    [provider]  Thiamine HCl (VITAMIN B-1 PO) Take by mouth.    [provider]      Allergies    Norvasc [amlodipine]    Review of Systems   Review of Systems  Constitutional:  Negative for fever.  HENT:  Negative for sore throat.   Eyes:  Negative for redness.  Respiratory:  Negative for cough and shortness of breath.   Cardiovascular:  Negative for  chest pain and leg swelling.  Gastrointestinal:  Positive for nausea and vomiting. Negative for abdominal pain.  Genitourinary:  Negative for dysuria and flank pain.  Musculoskeletal:  Negative for neck pain and neck stiffness.  Skin:  Negative for rash.  Neurological:  Positive for weakness. Negative for headaches.  Hematological:  Does not bruise/bleed easily.  Psychiatric/Behavioral:  Negative for confusion.     Physical Exam Updated Vital Signs BP (!) 161/60 (BP Location: Right Arm)   Pulse (!) 59   Temp 98.1 F (36.7 C) (Temporal)   Resp 18   SpO2 97%  Physical Exam Vitals and nursing note reviewed.  Constitutional:      Appearance: Normal appearance. She is well-developed.  HENT:     Head: Atraumatic.     Comments: No sinus or temporal tenderness.     Right Ear: Tympanic membrane normal.     Left Ear: Tympanic membrane normal.     Nose: Nose normal.     Mouth/Throat:     Mouth: Mucous membranes are moist.  Eyes:     General: No scleral icterus.    Conjunctiva/sclera: Conjunctivae normal.     Pupils: Pupils are equal, round, and reactive to light.  Neck:     Trachea: No tracheal deviation.     Comments: No stiffness or rigidity.  Cardiovascular:     Rate and Rhythm: Normal rate and regular rhythm.     Pulses: Normal pulses.     Heart sounds: Normal heart sounds. No murmur heard.    No friction rub. No gallop.  Pulmonary:     Effort: Pulmonary effort is normal. No respiratory distress.     Breath sounds: Normal breath sounds.  Abdominal:     General: Bowel sounds are normal. There is no distension.     Palpations: Abdomen is soft. There is no mass.     Tenderness: There is no abdominal tenderness.  Genitourinary:    Comments: No cva tenderness.  Musculoskeletal:        General: No swelling or tenderness.     Cervical back: Normal range of motion and neck supple. No rigidity. No muscular tenderness.     Right lower leg: No edema.     Left lower leg: No edema.   Skin:    General: Skin is warm and dry.     Findings: No rash.  Neurological:     Mental Status: She is alert.     Comments: Alert, speech normal. Motor/sens grossly intact bil.   Psychiatric:        Mood and Affect: Mood normal.     ED Results / Procedures / Treatments   Labs (all labs ordered are listed, but only abnormal results are displayed) Results for orders placed or performed in visit on  01/27/23  POCT rapid strep A  Result Value Ref Range   Rapid Strep A Screen Positive (A) Negative   XR C-ARM NO REPORT  Result Date: 01/12/2023 Please see Notes tab for imaging impression.  Epidural Steroid injection  Result Date: 01/08/2023 Tyrell Antonio, MD     01/18/2023 10:38 AM Lumbosacral Transforaminal Epidural Steroid Injection - Sub-Pedicular Approach with Fluoroscopic Guidance Patient: Quiyana Vaquerano     Date of Birth: 08-Sep-1941 MRN: [id number] PCP: Ardith Dark, MD     Visit Date: 01/08/2023  Universal Protocol:   Date/Time: 01/08/2023 Consent Given By: the patient Position: PRONE Additional Comments: Vital signs were monitored before and after the procedure. Patient was prepped and draped in the usual sterile fashion. The correct patient, procedure, and site was verified. Injection Procedure Details: Procedure diagnoses: Lumbar radiculopathy [M54.16]  Meds Administered: Meds ordered this encounter Medications  methylPREDNISolone acetate (DEPO-MEDROL) injection 80 mg Laterality: Bilateral Location/Site: L4 Needle:5.0 in., 22 ga.  Short bevel or Quincke spinal needle Needle Placement: Transforaminal Findings:   -Comments: Excellent flow of contrast along the nerve, nerve root and into the epidural space. Procedure Details: After squaring off the end-plates to get a true AP view, the C-arm was positioned so that an oblique view of the foramen as noted above was visualized. The target area is just inferior to the "nose of the scotty dog" or sub pedicular. The soft tissues  overlying this structure were infiltrated with 2-3 ml. of 1% Lidocaine without Epinephrine. The spinal needle was inserted toward the target using a "trajectory" view along the fluoroscope beam.  Under AP and lateral visualization, the needle was advanced so it did not puncture dura and was located close the 6 O'Clock position of the pedical in AP tracterory. Biplanar projections were used to confirm position. Aspiration was confirmed to be negative for CSF and/or blood. A 1-2 ml. volume of Isovue-250 was injected and flow of contrast was noted at each level. Radiographs were obtained for documentation purposes. After attaining the desired flow of contrast documented above, a 0.5 to 1.0 ml test dose of 0.25% Marcaine was injected into each respective transforaminal space.  The patient was observed for 90 seconds post injection.  After no sensory deficits were reported, and normal lower extremity motor function was noted,   the above injectate was administered so that equal amounts of the injectate were placed at each foramen (level) into the transforaminal epidural space. Additional Comments: No complications occurred Dressing: 2 x 2 sterile gauze and Band-Aid  Post-procedure details: Patient was observed during the procedure. Post-procedure instructions were reviewed. Patient left the clinic in stable condition.     EKG EKG Interpretation  Date/Time:  Thursday Feb 05 2023 15:14:13 EDT Ventricular Rate:  58 PR Interval:  168 QRS Duration: 92 QT Interval:  404 QTC Calculation: 397 R Axis:   62 Text Interpretation: Sinus rhythm No significant change since last tracing Confirmed by Cathren Laine ([id number]) on 02/05/2023 3:21:20 PM  Radiology No results found.  Procedures Procedures    Medications Ordered in ED Medications  hydrOXYzine (ATARAX) tablet 10 mg (has no administration in time range)    ED Course/ Medical Decision Making/ A&P                             Medical Decision  Making Problems Addressed: Essential hypertension: chronic illness or injury with exacerbation, progression, or side effects of treatment that poses a threat to  life or bodily functions Generalized weakness: acute illness or injury with systemic symptoms that poses a threat to life or bodily functions Hyponatremia: acute illness or injury with systemic symptoms that poses a threat to life or bodily functions Nausea: acute illness or injury  Amount and/or Complexity of Data Reviewed Independent Historian:     Details: Family, hx External Data Reviewed: notes. Labs: ordered. Decision-making details documented in ED Course. ECG/medicine tests: ordered and independent interpretation performed. Decision-making details documented in ED Course.  Risk OTC drugs. Prescription drug management. Decision regarding hospitalization.   Iv ns. Continuous pulse ox and cardiac monitoring. Labs ordered/sent.  Differential diagnosis includes dehydration, anemia, anxiety/stress, hypertension, etc. Dispo decision including potential need for admission considered - will get labs and reassess.   Reviewed nursing notes and prior charts for additional history. External reports reviewed. Additional history from: family.   Cardiac monitor: sinus rhythm, rate 60.  Labs reviewed/interpreted by me - chem with new/very low na, 121. Trop normal.   ?whether hyponatremia due to recent water intake (family indicates they encourage her to drink a lot of water as pt not eating/drinking very well), as well as recently placed on ace inhibitor and spironolactone for bp control.  Ns bolus.   Acetaminophen po, vistaril po. Po fliuds.   Given newly low na, 121, weakness, po intake, will consult hospitalists for admission.            Final Clinical Impression(s) / ED Diagnoses Final diagnoses:  None    Rx / DC Orders ED Discharge Orders     None         Cathren Laine, MD 02/05/23 1735

## 2023-02-05 NOTE — ED Notes (Signed)
Attempted report to [facility] before shift change. Number given for them to call back or next shift to attempt again.

## 2023-02-05 NOTE — ED Notes (Signed)
[facility] at [facility] will send transport for Bed Ready at [facility] 6E Room#[id number].-ABB(NS)

## 2023-02-05 NOTE — ED Notes (Signed)
Patient resting quietly in stretcher, respirations even, unlabored, no acute distress noted. Requesting snack at this time, will inquire with provider. Patient's daughter left at this time, requesting call when ambulance arrives.

## 2023-02-05 NOTE — ED Triage Notes (Signed)
Pt presents with complaints of generalized weakness, fatigue and vomiting that started this morning.

## 2023-02-05 NOTE — Plan of Care (Signed)
This is a 82 year old lady with history of hypertension, CAD, depression who presented to [facility] ED with a complaint of generalized weakness, lethargy, poor appetite for last few days which has been getting worse so she was brought into the emergency department.  Upon arrival to ED, hemodynamically stable with mild asymptomatic bacteriuria but was found to have acute hyponatremia and sodium of 121.  Troponin negative.  Per EDP, she recently saw her cardiology and her antihypertensive medications were adjusted.  Also that she was recently treated with antibiotics for strep throat.  Hospital admission was requested, patient was accepted to telemetry unit under [facility].  [facility] to resume care once patient arrives to either [facility] or [facility], until then, EDP to remain responsible for all patient care. Nursing staff, please page/call patient placement so appropriate [facility] hospitalist can be paged for full admission when patient arrives to the floor.

## 2023-02-06 ENCOUNTER — Encounter (HOSPITAL_COMMUNITY): Payer: Self-pay | Admitting: Family Medicine

## 2023-02-06 DIAGNOSIS — E860 Dehydration: Secondary | ICD-10-CM | POA: Diagnosis not present

## 2023-02-06 DIAGNOSIS — F03B4 Unspecified dementia, moderate, with anxiety: Secondary | ICD-10-CM | POA: Diagnosis not present

## 2023-02-06 DIAGNOSIS — R001 Bradycardia, unspecified: Secondary | ICD-10-CM | POA: Diagnosis not present

## 2023-02-06 DIAGNOSIS — E871 Hypo-osmolality and hyponatremia: Secondary | ICD-10-CM | POA: Diagnosis present

## 2023-02-06 DIAGNOSIS — Z7982 Long term (current) use of aspirin: Secondary | ICD-10-CM | POA: Diagnosis not present

## 2023-02-06 DIAGNOSIS — E861 Hypovolemia: Secondary | ICD-10-CM | POA: Diagnosis not present

## 2023-02-06 DIAGNOSIS — Z888 Allergy status to other drugs, medicaments and biological substances status: Secondary | ICD-10-CM | POA: Diagnosis not present

## 2023-02-06 DIAGNOSIS — R609 Edema, unspecified: Secondary | ICD-10-CM | POA: Diagnosis not present

## 2023-02-06 DIAGNOSIS — E785 Hyperlipidemia, unspecified: Secondary | ICD-10-CM | POA: Diagnosis not present

## 2023-02-06 DIAGNOSIS — Z603 Acculturation difficulty: Secondary | ICD-10-CM | POA: Diagnosis not present

## 2023-02-06 DIAGNOSIS — Z79899 Other long term (current) drug therapy: Secondary | ICD-10-CM | POA: Diagnosis not present

## 2023-02-06 DIAGNOSIS — R112 Nausea with vomiting, unspecified: Secondary | ICD-10-CM | POA: Diagnosis not present

## 2023-02-06 DIAGNOSIS — I10 Essential (primary) hypertension: Secondary | ICD-10-CM | POA: Diagnosis not present

## 2023-02-06 LAB — BASIC METABOLIC PANEL
Anion gap: 6 (ref 5–15)
Anion gap: 5 (ref 5–15)
Anion gap: 7 (ref 5–15)
BUN: 10 mg/dL (ref 8–23)
BUN: 11 mg/dL (ref 8–23)
BUN: 11 mg/dL (ref 8–23)
CO2: 24 mmol/L (ref 22–32)
CO2: 24 mmol/L (ref 22–32)
CO2: 21 mmol/L — ABNORMAL LOW (ref 22–32)
Calcium: 7.9 mg/dL — ABNORMAL LOW (ref 8.9–10.3)
Calcium: 8 mg/dL — ABNORMAL LOW (ref 8.9–10.3)
Calcium: 8 mg/dL — ABNORMAL LOW (ref 8.9–10.3)
Chloride: 96 mmol/L — ABNORMAL LOW (ref 98–111)
Chloride: 98 mmol/L (ref 98–111)
Chloride: 100 mmol/L (ref 98–111)
Creatinine, Ser: 0.75 mg/dL (ref 0.44–1.00)
Creatinine, Ser: 0.74 mg/dL (ref 0.44–1.00)
Creatinine, Ser: 0.82 mg/dL (ref 0.44–1.00)
GFR, Estimated: >60 mL/min (ref >60)
GFR, Estimated: >60 mL/min (ref >60)
GFR, Estimated: >60 mL/min (ref >60)
Glucose, Bld: 95 mg/dL (ref 70–99)
Glucose, Bld: 103 mg/dL — ABNORMAL HIGH (ref 70–99)
Glucose, Bld: 82 mg/dL (ref 70–99)
Potassium: 4.3 mmol/L (ref 3.5–5.1)
Potassium: 3.8 mmol/L (ref 3.5–5.1)
Potassium: 4.3 mmol/L (ref 3.5–5.1)
Sodium: 126 mmol/L — ABNORMAL LOW (ref 135–145)
Sodium: 127 mmol/L — ABNORMAL LOW (ref 135–145)
Sodium: 128 mmol/L — ABNORMAL LOW (ref 135–145)

## 2023-02-06 LAB — CBC
HCT: 32.9 % — ABNORMAL LOW (ref 36.0–46.0)
Hemoglobin: 11.1 g/dL — ABNORMAL LOW (ref 12.0–15.0)
MCH: 29.6 pg (ref 26.0–34.0)
MCHC: 33.7 g/dL (ref 30.0–36.0)
MCV: 87.7 fL (ref 80.0–100.0)
Platelets: 245 10*3/uL (ref 150–400)
RBC: 3.75 MIL/uL — ABNORMAL LOW (ref 3.87–5.11)
RDW: 12 % (ref 11.5–15.5)
WBC: 6.5 10*3/uL (ref 4.0–10.5)
nRBC: 0 % (ref 0.0–0.2)

## 2023-02-06 LAB — OSMOLALITY, URINE: Osmolality, Ur: 239 mOsm/kg — ABNORMAL LOW (ref 300–900)

## 2023-02-06 LAB — SODIUM: Sodium: 128 mmol/L — ABNORMAL LOW (ref 135–145)

## 2023-02-06 LAB — OSMOLALITY: Osmolality: 260 mOsm/kg — ABNORMAL LOW (ref 275–295)

## 2023-02-06 NOTE — Evaluation (Signed)
Occupational Therapy Evaluation Patient Details Name: Lynn [name] MRN: [id number] DOB: 31-Aug-1941 Today's Date: 02/06/2023   History of Present Illness Ms. Lynn [name] is a 82 yr old female brought to the hospital with lethargy, nausea, and vomiting. She was found to have hyponatremia and dehydration. PMH: HTN, bradycardia, HLD, mild cognitive disorder   Clinical Impression   The pt performed all assessed tasks with SBA to HHA for out of bed activity. This appears to be largely reflective of her baseline level of functioning, for which the pt's son agreed. She is not presenting with acute functional deficits that warrant the need for further OT services. OT will sign off and recommend she return home with her family at discharge.       Recommendations for follow up therapy are one component of a multi-disciplinary discharge planning process, led by the attending physician.  Recommendations may be updated based on patient status, additional functional criteria and insurance authorization.   Assistance Recommended at Discharge Set up Supervision/Assistance  Patient can return home with the following Direct supervision/assist for medications management;Assist for transportation;Help with stairs or ramp for entrance;Assistance with cooking/housework    Functional Status Assessment  Patient has not had a recent decline in their functional status  Equipment Recommendations  None recommended by OT       Precautions / Restrictions Precautions Precaution Comments: She speaks Farsi Restrictions Weight Bearing Restrictions: No      Mobility Bed Mobility   Bed Mobility: Supine to Sit     Supine to sit: Modified independent (Device/Increase time)          Transfers Overall transfer level: Needs assistance   Transfers: Sit to/from Stand Sit to Stand: Supervision           Balance     Sitting balance-Leahy Scale: Good         Standing balance comment: HHA to  simulate cane use           ADL either performed or assessed with clinical judgement   ADL Overall ADL's : At baseline                 Pertinent Vitals/Pain Pain Assessment Pain Assessment: No/denies pain     Hand Dominance Right   Extremity/Trunk Assessment Upper Extremity Assessment Upper Extremity Assessment: Overall WFL for tasks assessed   Lower Extremity Assessment Lower Extremity Assessment: Overall WFL for tasks assessed       Communication     Cognition Arousal/Alertness: Awake/alert Behavior During Therapy: WFL for tasks assessed/performed Overall Cognitive Status: History of cognitive impairments - at baseline        General Comments: able to follow 1 step commands consistently and oriented at least to person a place. The pt's son stated the pt's overall cognition fluctuates at times.                 Home Living Family/patient expects to be discharged to:: Private residence Living Arrangements: Children (Son and daughter) Available Help at Discharge: Family Type of Home: House Home Access: Stairs to enter Secretary/administrator of Steps:  (2-3)   Home Layout: Two level;Bed/bath upstairs     Bathroom Shower/Tub: Tub/shower unit         Home Equipment: Shower seat   Additional Comments: The pt's son was present during the session and translated throughout. He also assisted with providing history/info regarding her PLOF and living situation.      Prior Functioning/Environment Prior Level of Function : Needs assist  Mobility Comments: She occasionally used a cane for ambulation & her family provided supervision. ADLs Comments: The pt's family occasionally assisted the pt with self-care tasks, such as dressing, bathing, and toileting, depending on the day, as her cognitive abilities could fluctuate. Her family managed the household chores. Per her son, the pt has consistent in-home supervision.              OT  Treatment/Interventions:   No further treatment needs       OT Frequency:  N/A       AM-PAC OT "6 Clicks" Daily Activity     Outcome Measure Help from another person eating meals?: None Help from another person taking care of personal grooming?: None Help from another person toileting, which includes using toliet, bedpan, or urinal?: A Little Help from another person bathing (including washing, rinsing, drying)?: A Little Help from another person to put on and taking off regular upper body clothing?: None Help from another person to put on and taking off regular lower body clothing?: None 6 Click Score: 22   End of Session Equipment Utilized During Treatment: Gait belt Nurse Communication: Mobility status  Activity Tolerance: Patient tolerated treatment well Patient left: in chair;with call bell/phone within reach;with chair alarm set;with family/visitor present  OT Visit Diagnosis: Muscle weakness (generalized) (M62.81)                Time: 4782-9562 OT Time Calculation (min): 15 min Charges:  OT General Charges $OT Visit: 1 Visit OT Evaluation $OT Eval Low Complexity: 1 Low    Avagrace Botelho L Maddalynn Barnard, OTR/L 02/06/2023, 1:22 PM

## 2023-02-06 NOTE — TOC Progression Note (Signed)
Transition of Care [facility]) - Progression Note    Patient Details  Name: Lynn [name] MRN: [id number] Date of Birth: [birth year]  Transition of Care [facility]) CM/SW Contact  Beckie Busing, RN Phone Number:[phone]  02/06/2023, 3:49 PM  Clinical Narrative:     Transition of Care (TOC) Screening Note   Patient Details  Name: Lynn [name] Date of Birth: 02-May-1941   Transition of Care [facility]) CM/SW Contact:    Beckie Busing, RN Phone Number: 02/06/2023, 3:49 PM    Transition of Care Department [facility]) has reviewed patient and no TOC needs have been identified at this time. We will continue to monitor patient advancement through interdisciplinary progression rounds. If new patient transition needs arise, please place a TOC consult.          Expected Discharge Plan and Services                                               Social Determinants of Health (SDOH) Interventions SDOH Screenings   Food Insecurity: No Food Insecurity (02/06/2023)  Housing: Low Risk  (02/06/2023)  Transportation Needs: No Transportation Needs (02/06/2023)  Utilities: Not At Risk (02/06/2023)  Alcohol Screen: Low Risk  (01/20/2023)  Depression (PHQ2-9): Low Risk  (10/29/2022)  Physical Activity: Inactive (01/20/2023)  Tobacco Use: Low Risk  (02/06/2023)    Readmission Risk Interventions     No data to display

## 2023-02-06 NOTE — Progress Notes (Signed)
PT Cancellation Note  Patient Details Name: Lynn [name] MRN: [id number] DOB: 1941/02/13   Cancelled Treatment:    Reason Eval/Treat Not Completed: PT screened, no needs identified, will sign off Spoke with OT who saw pt and reports she is at her baseline.  OT spoke with family who denies need for therapy.  Will sign off.  Anise Salvo, PT Acute Rehab [facility] [phone]   [name] 02/06/2023, 10:23 AM

## 2023-02-06 NOTE — Progress Notes (Signed)
PROGRESS NOTE    Lynn [name]  ZOX:[id number] DOB: 08/27/1941 DOA: 02/05/2023 PCP: Ardith Dark, MD   Brief Narrative:  HPI: Lynn [name] is a 82 y.o. female with medical history significant of hypertension, bradycardia, hyperlipidemia and mild cognitive disorder who was brought to the [facility] ED by family due to 4 days history of lethargy and intermittent nausea vomiting.  No other complaint.  Patient denies any abdominal pain, fever, chills, chest pain, shortness of breath, any problem with urination or with bowel movements.  Per family, patient was recently started on lisinopril for hypertension.  Previously she was on losartan for 10 years.  She was also treated with antibiotic for strep throat lately.  No other changes in medications   ED Course: Upon arrival to ED, she was hemodynamically stable with mild bradycardia which she has history of.  She was found to have acute hyponatremia with sodium of 121.  She received 1 L of IV fluid bolus in the ED and was started on 75 mill per hour.  Hospitalist were consulted for admission.  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  I will recheck sodium later today.  Continue normal saline for now.   Nausea and vomiting: No vomiting but still has intermittent nausea.  Despite of this, family and patient desires to advance diet.  Will advance to soft now and if she tolerates then regular later.   Essential hypertension: Controlled.  Continue current regimen.   Hyperlipidemia: Continue  atorvastatin.   Sinus bradycardia: Still with intermittent bradycardia but asymptomatic, continue to hold metoprolol.    DVT prophylaxis: enoxaparin (LOVENOX) injection 40 mg Start: 02/05/23 2200   Code Status: Full Code  Family Communication: Daughter none present at bedside.  Plan of care discussed with patient in length and he/she verbalized understanding and agreed with it.  Status is: Observation The patient will require care spanning > 2 midnights and should be moved to inpatient because: Still symptomatic with hyponatremia, needs another day of hospitalization.   Estimated body mass index is 26.72 kg/m as calculated from the following:   Height as of this encounter: 4\' 9"  (1.448 m).   Weight as of this encounter: 56 kg.    Nutritional Assessment: Body mass index is 26.72 kg/m.[name] Seen by dietician.  I agree with the assessment and plan as outlined below: Nutrition Status:        . Skin Assessment: I have examined the patient's skin and I agree with the wound assessment as performed by the wound care RN as outlined below:    Consultants:  None  Procedures:  None  Antimicrobials:  Anti-infectives (From admission, onward)    None         Subjective: Patient seen and examined.  She is very pleasant [place] speaking lady.  Her daughter who is at the bedside is helping with translation.  Per daughter, she is feeling better but still has intermittent nausea but despite of that they want me to advance her diet.  Objective: Vitals:   02/05/23 2058 02/06/23 0005 02/06/23 0441 02/06/23 0954  BP:  [name])  100/53 (!) 112/58 (!) 123/52  Pulse:  62 (!) 56 65  Resp:  16 14 18   Temp:  97.9 F (36.6 C) (!) 97.5 F (36.4 C) (!) 97.5 F (36.4 C)  TempSrc:  Oral Oral Oral  SpO2:  95% 96% 97%  Weight: 56 kg     Height: 4\' 9"  (1.448 m)       Intake/Output Summary (Last 24 hours) at 02/06/2023 1014 Last data filed at 02/05/2023 2032 Gross per 24 hour  Intake 1000 ml   Output 300 ml  Net 700 ml   Filed Weights   02/05/23 2058  Weight: 56 kg    Examination:  General exam: Appears calm and comfortable  Respiratory system: Clear to auscultation. Respiratory effort normal. Cardiovascular system: S1 & S2 heard, RRR. No JVD, murmurs, rubs, gallops or clicks. No pedal edema. Gastrointestinal system: Abdomen is nondistended, soft and nontender. No organomegaly or masses felt. Normal bowel sounds heard. Central nervous system: Alert and oriented. No focal neurological deficits. Extremities: Symmetric 5 x 5 power. Skin: No rashes, lesions or ulcers Psychiatry: Judgement and insight appear normal. Mood & affect appropriate.    Data Reviewed: I have personally reviewed following labs and imaging studies  CBC: Recent Labs  Lab 02/05/23 1547 02/06/23 0549  WBC 7.1 6.5  HGB 12.3 11.1*  HCT 35.2* 32.9*  MCV 85.9 87.7  PLT 295 245   Basic Metabolic Panel: Recent Labs  Lab 02/05/23 1547 02/05/23 2123 02/06/23 0549  NA 121* 123* 126*  K 3.7 3.7 4.3  CL 85* 92* 96*  CO2 29 23 24   GLUCOSE 141* 112* 95  BUN 14 12 10   CREATININE 0.81 0.64 0.75  CALCIUM 8.8* 8.1* 7.9*   GFR: Estimated Creatinine Clearance: 39 mL/min (by C-G formula based on SCr of 0.75 mg/dL). Liver Function Tests: Recent Labs  Lab 02/05/23 1547  AST 23  ALT 18  ALKPHOS 59  BILITOT 0.5  PROT 6.8  ALBUMIN 4.0   No results for input(s): "LIPASE", "AMYLASE" in the last 168 hours. No results for input(s): "AMMONIA" in the last 168 hours. Coagulation Profile: No results for input(s): "INR", "PROTIME" in the last 168 hours. Cardiac Enzymes: No results for input(s): "CKTOTAL", "CKMB", "CKMBINDEX", "TROPONINI" in the last 168 hours. BNP (last 3 results) No results for input(s): "PROBNP" in the last 8760 hours. HbA1C: No results for input(s): "HGBA1C" in the last 72 hours. CBG: No results for input(s): "GLUCAP" in the last 168 hours. Lipid Profile: No results for  input(s): "CHOL", "HDL", "LDLCALC", "TRIG", "CHOLHDL", "LDLDIRECT" in the last 72 hours. Thyroid Function Tests: Recent Labs    02/05/23 2123  TSH 2.632   Anemia Panel: No results for input(s): "VITAMINB12", "FOLATE", "FERRITIN", "TIBC", "IRON", "RETICCTPCT" in the last 72 hours. Sepsis Labs: No results for input(s): "PROCALCITON", "LATICACIDVEN" in the last 168 hours.  No results found for this or any previous visit (from the past 240 hour(s)).   Radiology Studies: No results found.  Scheduled Meds:  aspirin EC  81 mg Oral QODAY   atorvastatin  10 mg Oral q1800   busPIRone  5 mg Oral BID   enoxaparin (LOVENOX) injection  40 mg Subcutaneous QHS   gabapentin  100 mg Oral TID   lisinopril  40 mg Oral Daily   Continuous Infusions:  0.9 % NaCl with KCl 20 mEq / L 75 mL/hr at 02/05/23 2257     LOS: 0 days   Hughie Closs, MD [facility]  02/06/2023, 10:14 AM   *  Please note that this is a verbal dictation therefore any spelling or grammatical errors are due to the "Dragon [employer] One" system interpretation.  Please page via Amion and do not message via secure chat for urgent patient care matters. Secure chat can be used for non urgent patient care matters.  How to contact the [facility] Attending or Consulting provider 7A - 7P or covering provider during after hours 7P -7A, for this patient?  Check the care team in [facility] and look for a) attending/consulting TRH provider listed and b) the [facility] team listed. Page or secure chat 7A-7P. Log into [email or web address] and use [place]'s universal password to access. If you do not have the password, please contact the hospital operator. Locate the [facility] provider you are looking for under [facility] and page to a number that you can be directly reached. If you still have difficulty reaching the provider, please page the [facility] (Director on Call) for the Hospitalists listed on amion for assistance.

## 2023-02-07 DIAGNOSIS — E871 Hypo-osmolality and hyponatremia: Secondary | ICD-10-CM | POA: Diagnosis not present

## 2023-02-07 NOTE — Discharge Summary (Signed)
Physician Discharge Summary  Lynn [name] ZOX:[id number] DOB: 1940/11/14 DOA: 02/05/2023  PCP: Ardith Dark, MD  Admit date: 02/05/2023 Discharge date: 02/07/2023 30 Day Unplanned Readmission Risk Score    Flowsheet Row ED to Hosp-Admission (Current) from 02/05/2023 in [facility]  30 Day Unplanned Readmission Risk Score (%) 13.82 Filed at 02/07/2023 0800       This score is the patient's risk of an unplanned readmission within 30 days of being discharged (0 -100%). The score is based on dignosis, age, lab data, medications, orders, and past utilization.   Low:  0-14.9   Medium: 15-21.9   High: 22-29.9   Extreme: 30 and above          Admitted From: Home Disposition: Home  Recommendations for Outpatient Follow-up:  Follow up with PCP in 1-2 weeks Please obtain BMP/CBC in one week Please follow up with your PCP on the following pending results: Unresulted Labs (From admission, onward)     Start     Ordered   02/12/23 0500  Creatinine, serum  (enoxaparin (LOVENOX)    CrCl >/= 30 ml/min)  Weekly,   R     Comments: while on enoxaparin therapy    02/05/23 2134   02/07/23 0805  Basic metabolic panel  ONCE - STAT,   STAT        02/07/23 0805   02/05/23 2135  Magnesium  Once,   R        02/05/23 2134   02/05/23 2134  CBC  (enoxaparin (LOVENOX)    CrCl >/= 30 ml/min)  Once,   R       Comments: Baseline for enoxaparin therapy IF NOT ALREADY DRAWN.  Notify MD if PLT < 100 K.    02/05/23 2134   02/05/23 2134  Creatinine, serum  (enoxaparin (LOVENOX)    CrCl >/= 30 ml/min)  Once,   R       Comments: Baseline for enoxaparin therapy IF NOT ALREADY DRAWN.    02/05/23 2134              Home Health: None Equipment/Devices: None  Discharge Condition: Stable  CODE STATUS: Full code Diet recommendation: Cardiac  Subjective: Seen and examined.  Son at the bedside helping with translation.  She has no complaints.  No nausea, no weakness.  She is  tolerating regular diet.  Son and daughter both want the patient to be discharged today.  Patient feels comfortable going home as well.  Brief/Interim Summary: Lynn [name] is a 82 y.o. female with medical history significant of hypertension, bradycardia, hyperlipidemia and mild cognitive disorder who was brought to the [facility] ED by family due to 4 days history of lethargy and intermittent nausea vomiting.  No other complaint.  Patient denies any abdominal pain, fever, chills, chest pain, shortness of breath, any problem with urination or with bowel movements.  Per family, patient was recently started on lisinopril for hypertension.  Previously she was on losartan for 10 years.  She was also treated with antibiotic for strep throat lately.  No other changes in medications   ED Course: Upon arrival to ED, she was hemodynamically stable with mild bradycardia which she has history of.  She was found to have acute hyponatremia with sodium of 121.  She received 1 L of IV fluid bolus in the ED and was started on 75 mill per hour.  She was admitted to hospital service.  Details below.   Acute hypovolemic hyponatremia/dehydration:  Interestingly, patient's labs indicate SIADH but her presentation is more likely hypovolemic hyponatremia which is proved by the fact that with normal saline, her sodium has improved to 128 today.  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  Patient is stable for discharge today.   Nausea and vomiting: Asymptomatic.  She is tolerating regular diet.   Essential hypertension: Controlled.  Resume home medications but metoprolol.   Hyperlipidemia: Continue atorvastatin.   Sinus bradycardia: Remained with asymptomatic bradycardia.  Her Lopressor was held.  Heart rate within normal range.  Discontinuing Lopressor at the time of discharge.  Discharge plan was discussed with patient and/or family member and they verbalized understanding and agreed with it.  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 Swallow whole.   atorvastatin 10 MG tablet Commonly known as: LIPITOR TAKE 1 TABLET (10 MG TOTAL) BY MOUTH DAILY AT 6 PM.   baclofen 10 MG tablet Commonly known as: LIORESAL Take 0.5-1 tablets (5-10 mg total) by mouth 3 (three) times daily as needed for muscle spasms.   busPIRone 5 MG tablet Commonly known as: BUSPAR Take 1 tablet (5 mg total) by mouth 2 (two) times daily.   calcium carbonate 1250 (500 Ca) MG tablet Commonly known as: OS-CAL - dosed in mg of elemental calcium Take 1 tablet by mouth 3 (three) times a week.   diclofenac Sodium 1 % Gel Commonly known as: VOLTAREN Apply 4 g topically 4 (four) times daily as needed.   gabapentin 100 MG capsule Commonly known as: NEURONTIN Take 1 capsule (100 mg total) by mouth 3 (three) times daily.   hydrALAZINE 25 MG tablet Commonly known as: APRESOLINE Take 1 tablet as needed for blood pressure above 150   hydrOXYzine 10 MG tablet Commonly known as: ATARAX TAKE 1 TABLET BY MOUTH 3 TIMES DAILY AS NEEDED FOR ANXIETY.   ibuprofen 200 MG tablet Commonly known as: ADVIL Take 200 mg by mouth every 6 (six) hours as needed.   lisinopril 40 MG tablet Commonly known as: ZESTRIL Take 1 tablet (40 mg total) by mouth daily.   meclizine 25 MG tablet Commonly known as: ANTIVERT Take 1 tablet (25 mg total) by mouth 3 (three) times daily as needed for  dizziness.   multivitamin tablet Take 1 tablet by mouth 2 (two) times a week.   VITAMIN B-1 PO Take by mouth.   VITAMIN B-6 PO Take by mouth.        Follow-up Information     Ardith Dark, MD Follow up in 1 week(s).   Specialty: Family Medicine Contact information: [address] [place] [address] [phone]                Allergies  Allergen Reactions   Norvasc [Amlodipine]     SWELLING     Consultations: None  Procedures/Studies: XR C-ARM NO REPORT  Result Date: 01/12/2023 Please see Notes tab for imaging impression.  Epidural Steroid injection  Result Date: 01/08/2023 Tyrell Antonio, MD     01/18/2023 10:38 AM Lumbosacral Transforaminal Epidural Steroid Injection - Sub-Pedicular Approach with Fluoroscopic Guidance Patient: Lynn [name]     Date of Birth: 1941/04/20 MRN: [id number] PCP: Ardith Dark, MD     Visit Date: 01/08/2023  Universal Protocol:   Date/Time: 01/08/2023 Consent Given By: the patient Position: PRONE Additional Comments: Vital signs were monitored before and after the procedure. Patient was prepped and draped in the usual sterile fashion. The correct patient, procedure, and site was verified. Injection Procedure Details: Procedure diagnoses: Lumbar radiculopathy [M54.16]  Meds Administered: Meds ordered this encounter Medications  methylPREDNISolone acetate (DEPO-MEDROL) injection 80 mg Laterality: Bilateral Location/Site: L4 Needle:5.0 in., 22 ga.  Short bevel or Quincke spinal needle Needle Placement: Transforaminal Findings:   -Comments: Excellent flow of contrast along the nerve, nerve root and into the epidural space. Procedure Details: After squaring off the end-plates to get a true AP view, the C-arm was positioned so that an oblique view of the foramen as noted above was visualized. The target area is just inferior to the "nose of the scotty dog" or sub pedicular. The soft tissues overlying this structure were  infiltrated with 2-3 ml. of 1% Lidocaine without Epinephrine. The spinal needle was inserted toward the target using a "trajectory" view along the fluoroscope beam.  Under AP and lateral visualization, the needle was advanced so it did not puncture dura and was located close the 6 O'Clock position of the pedical in AP tracterory. Biplanar projections were used to confirm position. Aspiration was confirmed to be negative for CSF and/or blood. A 1-2 ml. volume of Isovue-250 was injected and flow of contrast was noted at each level. Radiographs were obtained for documentation purposes. After attaining the desired flow of contrast documented above, a 0.5 to 1.0 ml test dose of 0.25% Marcaine was injected into each respective transforaminal space.  The patient was observed for 90 seconds post injection.  After no sensory deficits were reported, and normal lower extremity motor function was noted,   the above injectate was administered so that equal amounts of the injectate were placed at each foramen (level) into the transforaminal epidural space. Additional Comments: No complications occurred Dressing: 2 x 2 sterile gauze and Band-Aid  Post-procedure details: Patient was observed during the procedure. Post-procedure instructions were reviewed. Patient left the clinic in stable condition.     Discharge Exam: Vitals:   02/06/23 2004 02/07/23 0532  BP: (!) 147/60 (!) 149/62  Pulse: 66 71  Resp:  17  Temp: 98.1 F (36.7 C) 98 F (36.7 C)  SpO2: 99% 99%   Vitals:   02/06/23 1420 02/06/23 2004 02/07/23 0532 02/07/23 0533  BP: (!) 154/63 (!) 147/60 (!) 149/62   Pulse: 65 66 71   Resp: 16  17   Temp: (!) 97.3 F (36.3 C) 98.1 F (36.7 C) 98 F (36.7 C)   TempSrc: Oral Oral Oral   SpO2: 95% 99% 99%   Weight:    56 kg  Height:        General: Pt is alert, awake, not in acute distress Cardiovascular: RRR, S1/S2 +, no rubs, no gallops Respiratory: CTA bilaterally, no wheezing, no rhonchi Abdominal:  Soft, NT, ND, bowel sounds + Extremities: no edema, no cyanosis    The results of significant diagnostics from this hospitalization (including imaging,  microbiology, ancillary and laboratory) are listed below for reference.     Microbiology: No results found for this or any previous visit (from the past 240 hour(s)).   Labs: BNP (last 3 results) No results for input(s): "BNP" in the last 8760 hours. Basic Metabolic Panel: Recent Labs  Lab 02/05/23 1547 02/05/23 2123 02/06/23 0549 02/06/23 1028 02/06/23 1341 02/06/23 1825  NA 121* 123* 126* 127* 128* 128*  K 3.7 3.7 4.3 3.8  --  4.3  CL 85* 92* 96* 98  --  100  CO2 29 23 24 24   --  21*  GLUCOSE 141* 112* 95 103*  --  82  BUN 14 12 10 11   --  11  CREATININE 0.81 0.64 0.75 0.74  --  0.82  CALCIUM 8.8* 8.1* 7.9* 8.0*  --  8.0*   Liver Function Tests: Recent Labs  Lab 02/05/23 1547  AST 23  ALT 18  ALKPHOS 59  BILITOT 0.5  PROT 6.8  ALBUMIN 4.0   No results for input(s): "LIPASE", "AMYLASE" in the last 168 hours. No results for input(s): "AMMONIA" in the last 168 hours. CBC: Recent Labs  Lab 02/05/23 1547 02/06/23 0549  WBC 7.1 6.5  HGB 12.3 11.1*  HCT 35.2* 32.9*  MCV 85.9 87.7  PLT 295 245   Cardiac Enzymes: No results for input(s): "CKTOTAL", "CKMB", "CKMBINDEX", "TROPONINI" in the last 168 hours. BNP: Invalid input(s): "POCBNP" CBG: No results for input(s): "GLUCAP" in the last 168 hours. D-Dimer No results for input(s): "DDIMER" in the last 72 hours. Hgb A1c No results for input(s): "HGBA1C" in the last 72 hours. Lipid Profile No results for input(s): "CHOL", "HDL", "LDLCALC", "TRIG", "CHOLHDL", "LDLDIRECT" in the last 72 hours. Thyroid function studies Recent Labs    02/05/23 2123  TSH 2.632   Anemia work up No results for input(s): "VITAMINB12", "FOLATE", "FERRITIN", "TIBC", "IRON", "RETICCTPCT" in the last 72 hours. Urinalysis    Component Value Date/Time   COLORURINE STRAW (A)  02/05/2023 2026   APPEARANCEUR CLEAR 02/05/2023 2026   LABSPEC 1.005 02/05/2023 2026   PHURINE 7.0 02/05/2023 2026   GLUCOSEU NEGATIVE 02/05/2023 2026   GLUCOSEU NEGATIVE 07/18/2022 1230   HGBUR NEGATIVE 02/05/2023 2026   BILIRUBINUR NEGATIVE 02/05/2023 2026   BILIRUBINUR negative 07/19/2020 1047   KETONESUR NEGATIVE 02/05/2023 2026   PROTEINUR NEGATIVE 02/05/2023 2026   UROBILINOGEN 0.2 07/18/2022 1230   NITRITE NEGATIVE 02/05/2023 2026   LEUKOCYTESUR NEGATIVE 02/05/2023 2026   Sepsis Labs Recent Labs  Lab 02/05/23 1547 02/06/23 0549  WBC 7.1 6.5   Microbiology No results found for this or any previous visit (from the past 240 hour(s)).   Time coordinating discharge: Over 30 minutes  SIGNED:   Hughie Closs, MD  [facility] 02/07/2023, 9:51 AM *Please note that this is a verbal dictation therefore any spelling or grammatical errors are due to the "Dragon Medical One" system interpretation. If 7PM-7AM, please contact night-coverage [email or web address]

## 2023-02-09 ENCOUNTER — Telehealth: Payer: Self-pay

## 2023-02-09 NOTE — Transitions of Care (Post Inpatient/ED Visit) (Signed)
02/09/2023  Name: Lynn [name] MRN: [id number] DOB: June 28, 1941  Today's TOC FU Call Status: Today's TOC FU Call Status:: Successful TOC FU Call Competed TOC FU Call Complete Date: 02/09/23  Transition Care Management Follow-up Telephone Call Date of Discharge: 02/07/23 Discharge Facility: [facility]) Type of Discharge: Inpatient Admission Primary Inpatient Discharge Diagnosis:: Acute hyponatremia How have you been since you were released from the hospital?: Better Any questions or concerns?: No  Items Reviewed: Did you receive and understand the discharge instructions provided?: Yes Medications obtained,verified, and reconciled?: Yes (Medications Reviewed) Any new allergies since your discharge?: No Dietary orders reviewed?: NA Do you have support at home?: Yes People in Home: child(ren), adult  Medications Reviewed Today: Medications Reviewed Today     Reviewed by Leigh Aurora, CMA (Certified Medical Assistant) on 02/09/23 at 1609  Med List Status: <None>   Medication Order Taking? Sig Documenting Provider Last Dose Status Informant  aspirin EC 81 MG tablet [id number]  Take 81 mg by mouth every other day. Swallow whole. [provider]  Active Child  atorvastatin (LIPITOR) 10 MG tablet [id number]  TAKE 1 TABLET (10 MG TOTAL) BY MOUTH DAILY AT 6 PM. Ardith Dark, MD  Active Child  baclofen (LIORESAL) 10 MG tablet [id number] No Take 0.5-1 tablets (5-10 mg total) by mouth 3 (three) times daily as needed for muscle spasms.  Patient not taking: Reported on 02/09/2023   Persons, [place], [place] Not Taking Consider Medication Status and Discontinue Child  busPIRone (BUSPAR) 5 MG tablet [id number]  Take 1 tablet (5 mg total) by mouth 2 (two) times daily. Ardith Dark, MD  Active Child  calcium carbonate (OS-CAL - DOSED IN MG OF ELEMENTAL CALCIUM) 1250 (500 Ca) MG tablet [id number]  Take 1 tablet by mouth 3 (three) times a week. [provider]   Active Child  diclofenac Sodium (VOLTAREN) 1 % GEL [id number]  Apply 4 g topically 4 (four) times daily as needed. Persons, [place], [place]  Active Child  gabapentin (NEURONTIN) 100 MG capsule [id number]  Take 1 capsule (100 mg total) by mouth 3 (three) times daily. Juanda Chance, NP  Active Child  hydrALAZINE (APRESOLINE) 25 MG tablet [id number]  Take 1 tablet as needed for blood pressure above 150 Chilton Si, MD  Active Child           Med Note (CARD, AMY L   Fri Feb 06, 2023  3:22 PM) Patients Daughter Charissa Bash) provided med hx. Daughter stated she had been struggling with blood pressure medications the past 2 weeks. Last blood pressure medication given was Hydralazine 25mg  yesterday.    hydrOXYzine (ATARAX) 10 MG tablet [id number]  TAKE 1 TABLET BY MOUTH 3 TIMES DAILY AS NEEDED FOR ANXIETY. Ardith Dark, MD  Active Child  ibuprofen (ADVIL,MOTRIN) 200 MG tablet [id number]  Take 200 mg by mouth every 6 (six) hours as needed. [provider]  Active Child  lisinopril (ZESTRIL) 40 MG tablet [id number]  Take 1 tablet (40 mg total) by mouth daily. Chilton Si, MD  Active Child  meclizine (ANTIVERT) 25 MG tablet [id number]  Take 1 tablet (25 mg total) by mouth 3 (three) times daily as needed for dizziness. Melene Plan, DO  Active Child  Multiple Vitamin (MULTIVITAMIN) tablet [id number]  Take 1 tablet by mouth 2 (two) times a week. [provider]  Active Child  Pyridoxine HCl (VITAMIN B-6 PO) [id number]  Take by mouth. [provider]  Active Child  Thiamine HCl (VITAMIN  B-1 PO) [id number]  Take by mouth. [provider]  Active Child            Home Care and Equipment/Supplies: Were Home Health Services Ordered?: NA Any new equipment or medical supplies ordered?: NA  Functional Questionnaire: Do you need assistance with bathing/showering or dressing?: No Do you need assistance with meal preparation?: No Do you need assistance with eating?: No Do  you have difficulty maintaining continence: No Do you need assistance with getting out of bed/getting out of a chair/moving?: No Do you have difficulty managing or taking your medications?: No  Follow up appointments reviewed: PCP Follow-up appointment confirmed?: Yes Date of PCP follow-up appointment?: 02/10/23 Follow-up Provider: Dr. Jimmey Ralph Specialist [facility] Follow-up appointment confirmed?: NA Do you need transportation to your follow-up appointment?: No Do you understand care options if your condition(s) worsen?: Yes-patient verbalized understanding    SIGNATURE Agnes Lawrence, CMA (AAMA)  [facility] [phone]

## 2023-02-10 ENCOUNTER — Encounter: Payer: Self-pay | Admitting: Family Medicine

## 2023-02-10 ENCOUNTER — Ambulatory Visit (INDEPENDENT_AMBULATORY_CARE_PROVIDER_SITE_OTHER): Payer: Medicare Other | Admitting: Family Medicine

## 2023-02-10 VITALS — BP 155/68 | HR 55 | Temp 97.4°F | Ht <= 58 in | Wt 124.0 lb

## 2023-02-10 DIAGNOSIS — I10 Essential (primary) hypertension: Secondary | ICD-10-CM

## 2023-02-10 DIAGNOSIS — E871 Hypo-osmolality and hyponatremia: Secondary | ICD-10-CM

## 2023-02-10 LAB — CATECHOLAMINES, FRACTIONATED, PLASMA
Dopamine: <30 pg/mL (ref 0–48)
Epinephrine: 49 pg/mL (ref 0–62)
Norepinephrine: 758 pg/mL (ref 0–874)

## 2023-02-10 LAB — BASIC METABOLIC PANEL
BUN/Creatinine Ratio: 23 (ref 12–28)
BUN: 21 mg/dL (ref 8–27)
BUN: 13 mg/dL (ref 6–23)
CO2: 23 mmol/L (ref 20–29)
CO2: 27 mEq/L (ref 19–32)
Calcium: 9.8 mg/dL (ref 8.7–10.3)
Calcium: 9.4 mg/dL (ref 8.4–10.5)
Chloride: 91 mmol/L — ABNORMAL LOW (ref 96–106)
Chloride: 93 mEq/L — ABNORMAL LOW (ref 96–112)
Creatinine, Ser: 0.93 mg/dL (ref 0.57–1.00)
Creatinine, Ser: 0.79 mg/dL (ref 0.40–1.20)
GFR: 69.75 mL/min (ref >60.00)
Glucose, Bld: 100 mg/dL — ABNORMAL HIGH (ref 70–99)
Glucose: 106 mg/dL — ABNORMAL HIGH (ref 70–99)
Potassium: 6.4 mmol/L (ref 3.5–5.2)
Potassium: 4.1 mEq/L (ref 3.5–5.1)
Sodium: 127 mmol/L — ABNORMAL LOW (ref 134–144)
Sodium: 128 mEq/L — ABNORMAL LOW (ref 135–145)
eGFR: 61 mL/min/{1.73_m2} (ref >59)

## 2023-02-10 LAB — ALDOSTERONE + RENIN ACTIVITY W/ RATIO
Aldos/Renin Ratio: 23.3 (ref 0.0–30.0)
Aldosterone: 25.2 ng/dL (ref 0.0–30.0)
Renin Activity, Plasma: 1.082 ng/mL/hr (ref 0.167–5.380)

## 2023-02-10 LAB — METANEPHRINES, PLASMA
Metanephrine, Free: <25 pg/mL (ref 0.0–88.0)
Normetanephrine, Free: 52.3 pg/mL (ref 0.0–297.2)

## 2023-02-10 MED ORDER — METOPROLOL TARTRATE 25 MG PO TABS: 25.0000 mg | ORAL_TABLET | Freq: Two times a day (BID) | ORAL | 3 refills | Status: DC

## 2023-02-10 NOTE — Progress Notes (Signed)
Chief Complaint:  Lynn [name] is a 82 y.o. female who presents today for a TCM visit.  Assessment/Plan:  New/Acute Problems: Hyponatremia We will recheck labs today.  This is likely medication induced due to combination of lisinopril and HCTZ.  Lisinopril was prescribed by cardiology however the patient's daughter has still continue to give HCTZ 25 mg intermittently over the last several months since it was discontinued last year.  Discussed that this medication needs to be stopped ASAP.  Patient's daughter did tell me that she stopped giving this to her mother a few days ago.  Had a lengthy discussion with patient and her daughter regarding importance to be consistent with the medications to prevent potential complications - see below.  We did discuss potential complications of hyponatremia including, or even death.  If sodium continues to be low may consider trial off ACEi vs referral to nephrology for further workup.  Chronic Problems Addressed Today: Hypertension Blood pressure mildly elevated today but has been recently more well-controlled.  She is following with cardiology and her current regimen that is prescribed to her is metoprolol tartrate 25 mg twice daily, lisinopril 40 mg daily, and hydralazine 25 mg daily as needed.  It does seem as if there is still a lot of confusion regarding her medications.  Had a lengthy discussion with patient and patient's daughter as above today regarding importance of making sure that she is only taking medications as prescribed and that her current medications should not be mixed with any other antihypertensives without discussing with a physician first.  Patient's daughter agreed and voiced understanding.  They were disposed of any medications at home that she is not currently prescribed.  They will continue to monitor at home and follow up with the hypertension clinic as previously planned.       Subjective:  HPI:  Summary of  Hospital admission: Reason for admission: Hyponatremia Date of admission: 02/05/2023 Date of discharge: 02/07/2023 Date of Interactive contact: 02/09/2023 Summary of Hospital course: Patient presented to the ED with lethargy, nausea, and vomiting.  In the ED was found to be hyponatremic with a sodium of 121.  She was admitted for further management.  Initially was concerned about possible SIADH however it was thought that her hyponatremic episode was likely due to hypovolemic hyponatremia as her symptoms improved with administration of normal saline.  She improved clinically and was discharged home on hospital day 2.  Interim history:  Patient is here with her daughter today.  She has been doing reasonably well for the last couple of days.  Blood pressures at home have still continued to be labile.  She did have an elevated reading into the 190s yesterday that improved with hydralazine.  She is currently taking lisinopril 40 mg daily and metoprolol tartrate 25 mg twice daily.  Metoprolol was held at discharge however they resume this upon going home.  Patient's daughter also tells me today that she has been intermittently giving her mother HCTZ 25 mg for blood pressure control.  She does have medication bottle with her today.  This was discontinued last year however her daughter has kept the medication and continued to administer periodically.  They have been consistent with lisinopril and metoprolol.  Also using hydralazine as needed.  ROS: Per HPI, otherwise a complete review of systems was negative.   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 No pertinent surgical history.  Family History  Problem Relation Age of Onset   Diverticulitis Mother    Diabetes Mother    Heart attack Father    Other Father        unsure   Heart attack Sister    Hypertension Sister    Stroke Sister     Medications- Reconciled discharge and current medications in Epic.  Current Outpatient Medications  Medication Sig Dispense Refill   aspirin EC 81 MG tablet Take 81 mg by mouth every other day. Swallow whole.     atorvastatin (LIPITOR) 10 MG tablet TAKE 1 TABLET (10 MG TOTAL) BY MOUTH DAILY AT 6 PM. 90 tablet 3   baclofen (LIORESAL) 10 MG tablet Take 0.5-1 tablets (5-10 mg total) by mouth 3 (three) times daily as needed for muscle spasms. 30 each 3   busPIRone (BUSPAR) 5 MG tablet Take 1 tablet (5 mg total) by mouth 2 (two) times daily. 60 tablet 5   calcium carbonate (OS-CAL - DOSED IN MG OF ELEMENTAL CALCIUM) 1250 (500 Ca) MG tablet Take 1 tablet by mouth 3 (three) times a week.     diclofenac Sodium (VOLTAREN) 1 % GEL Apply 4 g topically 4 (four) times daily as needed. 500 g 3   hydrALAZINE (APRESOLINE) 25 MG tablet Take 1 tablet as needed for blood pressure above 150 30 tablet 1   hydrOXYzine (ATARAX) 10 MG tablet TAKE 1 TABLET BY MOUTH 3 TIMES DAILY AS NEEDED FOR ANXIETY. 270 tablet 2   ibuprofen (ADVIL,MOTRIN) 200 MG tablet Take 200 mg by mouth every 6 (six) hours as needed.     lisinopril (ZESTRIL) 40 MG tablet Take 1 tablet (40 mg total) by mouth daily. 90 tablet 3   metoprolol tartrate (LOPRESSOR) 25 MG tablet Take 1 tablet (25 mg total) by mouth 2 (two) times daily. 180 tablet 3   Multiple Vitamin  (MULTIVITAMIN) tablet Take 1 tablet by mouth 2 (two) times a week.     Pyridoxine HCl (VITAMIN B-6 PO) Take by mouth.     Thiamine HCl (VITAMIN B-1 PO) Take by mouth.     gabapentin (NEURONTIN) 100 MG capsule Take 1 capsule (100 mg total) by mouth 3 (three) times daily. (Patient not taking: Reported on 02/10/2023) 90 capsule 0   meclizine (ANTIVERT) 25 MG tablet Take 1 tablet (25 mg total) by mouth 3 (three) times daily as needed for dizziness. (Patient not taking: Reported on 02/10/2023) 30 tablet 0   No current facility-administered medications for this visit.    Allergies-reviewed and updated Allergies  Allergen Reactions   Norvasc [Amlodipine]     SWELLING     Social History   Socioeconomic History   Marital status: Widowed    Spouse name: Not on file   Number of children: 2   Years of education: some college   Highest education level: Not on file  Occupational  History   Occupation: Retired  Tobacco Use   Smoking status: Never   Smokeless tobacco: Never  Vaping Use   Vaping Use: Never used  Substance and Sexual Activity   Alcohol use: No   Drug use: No   Sexual activity: Not Currently  Other Topics Concern   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; clerical work for office   Social Determinants of Corporate investment banker Strain: Not on file  Food Insecurity: No Food Insecurity (02/06/2023)   Hunger Vital Sign    Worried About Running Out of Food in the Last Year: Never true    Ran Out of Food in the Last Year: Never true  Transportation Needs: No Transportation Needs (02/06/2023)   PRAPARE - Administrator, Civil Service (Medical): No    Lack of Transportation (Non-Medical): No  Physical Activity: Inactive (01/20/2023)   Exercise Vital Sign     Days of Exercise per Week: 0 days    Minutes of Exercise per Session: 0 min  Stress: Not on file  Social Connections: Not on file        Objective:  Physical Exam: BP (!) 155/68   Pulse (!) 55   Temp (!) 97.4 F (36.3 C) (Temporal)   Ht 4\' 9"  (1.448 m)   Wt 124 lb (56.2 kg)   SpO2 97%   BMI 26.83 kg/m   Gen: NAD, resting comfortably CV: RRR with no murmurs appreciated Pulm: NWOB, CTAB with no crackles, wheezes, or rhonchi GI: Normal bowel sounds present. Soft, Nontender, Nondistended. MSK: No edema, cyanosis, or clubbing noted Skin: Warm, dry Neuro: Grossly normal, moves all extremities Psych: Normal affect and thought content  Time Spent: 45 minutes of total time was spent on the date of the encounter performing the following actions: chart review prior to seeing the patient, obtaining history, performing a medically necessary exam, counseling on the treatment plan, placing orders, and documenting in our EHR.         Katina Degree. Jimmey Ralph, MD 02/10/2023 2:09 PM

## 2023-02-10 NOTE — Patient Instructions (Signed)
It was very nice to see you today!  It is VERY IMPORTANT that you stay consistent with blood pressure medications and do not take any medications that are not on your medication list.  We will check blood work today.  Please follow-up with Dr. Duke Salvia soon for your blood pressure control.  Return if symptoms worsen or fail to improve.   Take care, Dr Jimmey Ralph  PLEASE NOTE:  If you had any lab tests, please let us know if you have not heard back within a few days. You may see your results on mychart before we have a chance to review them but we will give you a call once they are reviewed by [place].   If we ordered any referrals today, please let us know if you have not heard from their office within the next week.   If you had any urgent prescriptions sent in today, please check with the pharmacy within an hour of our visit to make sure the prescription was transmitted appropriately.   Please try these tips to maintain a healthy lifestyle:  Eat at least 3 REAL meals and 1-2 snacks per day.  Aim for no more than 5 hours between eating.  If you eat breakfast, please do so within one hour of getting up.   Each meal should contain half fruits/vegetables, one quarter protein, and one quarter carbs (no bigger than a computer mouse)  Cut down on sweet beverages. This includes juice, soda, and sweet tea.   Drink at least 1 glass of water with each meal and aim for at least 8 glasses per day  Exercise at least 150 minutes every week.

## 2023-02-10 NOTE — Assessment & Plan Note (Addendum)
Blood pressure mildly elevated today but has been recently more well-controlled.  She is following with cardiology and her current regimen that is prescribed to her is metoprolol tartrate 25 mg twice daily, lisinopril 40 mg daily, and hydralazine 25 mg daily as needed.  It does seem as if there is still a lot of confusion regarding her medications.  Had a lengthy discussion with patient and patient's daughter as above today regarding importance of making sure that she is only taking medications as prescribed and that her current medications should not be mixed with any other antihypertensives without discussing with a physician first.  Patient's daughter agreed and voiced understanding.  They were disposed of any medications at home that she is not currently prescribed.  They will continue to monitor at home and follow up with the hypertension clinic as previously planned.

## 2023-02-11 NOTE — Progress Notes (Signed)
Her sodium is stable but still low.  They need to stay off of the HCTZ as we reiterated at their most recent office visit.  We should have this rechecked either here or with her cardiologist again in a few weeks.

## 2023-02-13 ENCOUNTER — Telehealth: Payer: Self-pay | Admitting: Cardiovascular Disease

## 2023-02-13 ENCOUNTER — Ambulatory Visit: Payer: Medicare Other | Attending: Cardiology | Admitting: Pharmacist Clinician (PhC)/ Clinical Pharmacy Specialist

## 2023-02-13 ENCOUNTER — Encounter: Payer: Self-pay | Admitting: Pharmacist Clinician (PhC)/ Clinical Pharmacy Specialist

## 2023-02-13 VITALS — BP 123/76 | HR 56

## 2023-02-13 DIAGNOSIS — F419 Anxiety disorder, unspecified: Secondary | ICD-10-CM | POA: Diagnosis present

## 2023-02-13 DIAGNOSIS — I10 Essential (primary) hypertension: Secondary | ICD-10-CM

## 2023-02-13 LAB — BASIC METABOLIC PANEL
BUN/Creatinine Ratio: 20 (ref 12–28)
BUN: 17 mg/dL (ref 8–27)
CO2: 29 mmol/L (ref 20–29)
Calcium: 9.3 mg/dL (ref 8.7–10.3)
Chloride: 95 mmol/L — ABNORMAL LOW (ref 96–106)
Creatinine, Ser: 0.87 mg/dL (ref 0.57–1.00)
Glucose: 85 mg/dL (ref 70–99)
Potassium: 4.5 mmol/L (ref 3.5–5.2)
Sodium: 129 mmol/L — ABNORMAL LOW (ref 134–144)
eGFR: 66 mL/min/{1.73_m2} (ref >59)

## 2023-02-13 MED ORDER — HYDRALAZINE HCL 25 MG PO TABS: 25.0000 mg | ORAL_TABLET | Freq: Two times a day (BID) | ORAL | 3 refills | Status: DC

## 2023-02-13 NOTE — Telephone Encounter (Signed)
Returned call to patient's daughter,   Patient's daughter states that her PCP gave her medication for high potassium. She was seen in ED last Thursday and her sodium level was low at 121. She was admitted to the hospital. Na went up but not much. Her sodium has dropped again, she states she has low energy. She states she isn't having any other symptoms just low energy. She states the low energy about a month ago. She was started on some new medication about a month ago. She states they do check her blood pressure at home. She has been using a lot of the PRN hydralazine due to elevated blood pressures. She notes a pressure of around 181/ but cannot remember the bottom number, from last night, she states she took two of her PRN medications.   Patient is currently scheduled 6/6 with HTN PharmD

## 2023-02-13 NOTE — Telephone Encounter (Signed)
Chart reviewed.  Seen in advance hypertension clinic 01/20/23.  Confusing regarding medication management if she does ultimately recommended to take only lisinopril 40 mg daily, metoprolol heart rate 25 mg BID, spironolactone 12.5 mg daily, Hydralazine 25mg  PRN for SBP >150.  Spironolactone discontinued 02/03/2023 due to hyperkalemia.  Admitted 5/16-5/18/24 with lethargy intermittent nausea/vomiting. treated for hyponatremia and Na improved with normal saline (Na as low as 121 during admission and 128 on discharge). felt to be related to hypovolemia. Lopressor held due to bradycardia. SBP range while hospitalized 100s-160s with vitals day of discahrge 149/62.   Saw PCP 02/10/23 with BP 155/69 -it was noted daughter was giving HCTZ intermittently though was discontinued one year ago and was educated to discontinue and discussed with the medication.  There was confusion regarding medications.  Trobalt 125 mg twice daily it appears was resumed.  Repeat labs 02/10/23 Na 128, creatinine 0.79, K 4.1, GFR 69.75 Na has been low since 09/26/22. Prior to that time was also intermittently low dating back to 2018  Will ask nursing team to advise patient/family: Sodium level stable compared to hospital discharge.  Please ensure has disposed of hydrochlorothiazide as well as spironolactone. Please ensure checking BP routinely at least an hour after medications. Please ensure she is taking ONLY medications on her list. She should only have Hydralazine for PRN medication. Recommend she bring ALL pill bottles to next appt for medication reconciliation.  Can move up visit with PharmD to next week (slot 02/17/23 at 11am) if she prefers.  If BP persistently high and she is checking appropriately may need to adjust Hydralazine to scheduled dosing but can determine this based on how she reports checking BP.  Alver Sorrow, NP

## 2023-02-13 NOTE — Telephone Encounter (Signed)
Pt daughter states pt states her sodium is low and she has not energy. She states she spoke to her PCP and they suggested some changes to her diet, she would like Dr. Leonides Sake advise.

## 2023-02-13 NOTE — Telephone Encounter (Signed)
Returned call to patient's daughter, offered appointment today with PharmD at [facility] office, they accepted and will be seen at 1:30. Phone note routed to [facility] as unsure who will be seeing patient today.

## 2023-02-13 NOTE — Assessment & Plan Note (Signed)
Assessment: BP is controlled in office BP 123/76 mmHg;  Did take 2 doses of hydralazine yesterday Complains of ongoing fatigue, dizziness and fogginess.  Unsure if due to medications, hyponatremia or other cause Ongoing hyponatremia Low dose spironolactone caused marked elevation in potassium level   Plan:  Start taking hydralazine 25 mg twice daily every day.   Continue taking lisinopril 40 mg daily Patient to keep record of BP readings with heart rate and report to [place] at the next visit STAT BMET today 2/2 ongoing hyponatremia Patient to follow up with PharmD in 2 weeks

## 2023-02-13 NOTE — Progress Notes (Signed)
Office Visit    Patient Name: Lynn [name] Date of Encounter: 02/13/2023  Primary Care Provider:  Ardith Dark, MD Primary Cardiologist:  Donato Schultz, MD  Chief Complaint    Hypertension - Advanced hypertension clinic  Past Medical History   Pre-DM 10/23 A1c 6.3  hyperlipidemia 10/23 LDL 66 on atorvastatin 10  hyponatremia Most recent Na 128 (4 days ago)    Allergies  Allergen Reactions   Norvasc [Amlodipine]     SWELLING     History of Present Illness    Lynn [name] is a 82 y.o. female patient who was referred to the [facility] Hypert[facility] Clinic.  Her primary cardiologist is Dr. Anne Fu.  She was seen last month by Dr. Duke Salvia, at which time her BP was noted to be 174/69, despite taking higher than recommended dose of losartan (100 mg bid) as well as captopril.  Dr Duke Salvia switched her to lisinopril 40 mg and stopped both the losartan and captopril.   She was also started on spironolactone 12.5 mg daily and hydralazine 25 mg prn SBP > 150.  BMET done 2 weeks later showed potassium up to 6.4.  Lokelma x 1 was given, however patient was seen in ED two days after elevated reading and admitted with hyponatremia.  Sodium was corrected and she was dischargd after 2 days.    Today she is in the office for follow up.  Her daughter is doing the translating (Farsi), although patient isn't saying much throughout visit.  She does close her eyes and appear to nod off during the visit, but rouses easily when spoken to. She has used a few doses of hydralazine and they have noted an improvement in BP readings when she takes it.  However she is still having high readings fairly regularly and still complains of a lack of energy, fogginess and dizziness.  She also has issues with stress/anxiety compounded recently with the death of her sister.    Blood Pressure Goal:  130/80  Current Medications: lisinopril 40 mg qd, metoprolol tart 25 mg bid, hydralazine 25 mg prn SBP  > 150  Previously tried:   amlodipine - edema  Social Hx:      Tobacco: no  Alcohol: no  Caffeine:no  Diet: appetite good, grazes rather than big meals, doesn't eat out; mostly vegetables and grilled chicken; tea twice daily; some water -2-3 cups, plus juice and fresh fruit (watermelon)     Exercise: back and knee pain, now tired all the time  Home BP readings:  no readings with them, however daughter states between 120-160 systolic.    Accessory Clinical Findings    Lab Results  Component Value Date   CREATININE 0.79 02/10/2023   BUN 13 02/10/2023   NA 128 (L) 02/10/2023   K 4.1 02/10/2023   CL 93 (L) 02/10/2023   CO2 27 02/10/2023   Lab Results  Component Value Date   ALT 18 02/05/2023   AST 23 02/05/2023   ALKPHOS 59 02/05/2023   BILITOT 0.5 02/05/2023   Lab Results  Component Value Date   HGBA1C 6.3 07/18/2022    Screening for Secondary Hypertension:      01/20/2023    4:13 PM  Causes  Drugs/Herbals Screened     - Comments No tobacco,no EtOH, limits salt  Renovascular HTN N/A  Sleep Apnea Screened     - Comments no snoring  Thyroid Disease Screened    Relevant Labs/Studies:    Latest Ref Rng & Units 02/10/2023  2:05 PM 02/06/2023    6:25 PM 02/06/2023    1:41 PM  Basic Labs  Sodium 135 - 145 mEq/L 128  128  128   Potassium 3.5 - 5.1 mEq/L 4.1  4.3    Creatinine 0.40 - 1.20 mg/dL 8.11  9.14         Latest Ref Rng & Units 02/05/2023    9:23 PM 09/02/2022    2:46 PM  Thyroid   TSH 0.350 - 4.500 uIU/mL 2.632  2.30        Latest Ref Rng & Units 02/02/2023    9:44 AM  Renin/Aldosterone   Aldosterone 0.0 - 30.0 ng/dL 78.2   Aldos/Renin Ratio 0.0 - 30.0 23.3        Latest Ref Rng & Units 02/02/2023    9:44 AM  Metanephrines/Catecholamines   Epinephrine 0 - 62 pg/mL 49   Norepinephrine 0 - 874 pg/mL 758   Dopamine 0 - 48 pg/mL <30   Metanephrines 0.0 - 88.0 pg/mL <25.0   Normetanephrines  0.0 - 297.2 pg/mL 52.3           09/26/2022   11:02  AM  Renovascular   Renal Artery [place] Completed Yes      Home Medications    Current Outpatient Medications  Medication Sig Dispense Refill   hydrALAZINE (APRESOLINE) 25 MG tablet Take 1 tablet (25 mg total) by mouth in the morning and at bedtime. 60 tablet 3   aspirin EC 81 MG tablet Take 81 mg by mouth every other day. Swallow whole.     atorvastatin (LIPITOR) 10 MG tablet TAKE 1 TABLET (10 MG TOTAL) BY MOUTH DAILY AT 6 PM. 90 tablet 3   baclofen (LIORESAL) 10 MG tablet Take 0.5-1 tablets (5-10 mg total) by mouth 3 (three) times daily as needed for muscle spasms. 30 each 3   busPIRone (BUSPAR) 5 MG tablet Take 1 tablet (5 mg total) by mouth 2 (two) times daily. 60 tablet 5   calcium carbonate (OS-CAL - DOSED IN MG OF ELEMENTAL CALCIUM) 1250 (500 Ca) MG tablet Take 1 tablet by mouth 3 (three) times a week.     diclofenac Sodium (VOLTAREN) 1 % GEL Apply 4 g topically 4 (four) times daily as needed. 500 g 3   gabapentin (NEURONTIN) 100 MG capsule Take 1 capsule (100 mg total) by mouth 3 (three) times daily. (Patient not taking: Reported on 02/10/2023) 90 capsule 0   hydrOXYzine (ATARAX) 10 MG tablet TAKE 1 TABLET BY MOUTH 3 TIMES DAILY AS NEEDED FOR ANXIETY. 270 tablet 2   ibuprofen (ADVIL,MOTRIN) 200 MG tablet Take 200 mg by mouth every 6 (six) hours as needed.     lisinopril (ZESTRIL) 40 MG tablet Take 1 tablet (40 mg total) by mouth daily. 90 tablet 3   meclizine (ANTIVERT) 25 MG tablet Take 1 tablet (25 mg total) by mouth 3 (three) times daily as needed for dizziness. (Patient not taking: Reported on 02/10/2023) 30 tablet 0   metoprolol tartrate (LOPRESSOR) 25 MG tablet Take 1 tablet (25 mg total) by mouth 2 (two) times daily. 180 tablet 3   Multiple Vitamin (MULTIVITAMIN) tablet Take 1 tablet by mouth 2 (two) times a week.     Pyridoxine HCl (VITAMIN B-6 PO) Take by mouth.     Thiamine HCl (VITAMIN B-1 PO) Take by mouth.     No current facility-administered medications for this visit.      Assessment & Plan       Hypertension  Assessment: BP is controlled in office BP 123/76 mmHg;  Did take 2 doses of hydralazine yesterday Complains of ongoing fatigue, dizziness and fogginess.  Unsure if due to medications, hyponatremia or other cause Ongoing hyponatremia Low dose spironolactone caused marked elevation in potassium level   Plan:  Start taking hydralazine 25 mg twice daily every day.   Continue taking lisinopril 40 mg daily Patient to keep record of BP readings with heart rate and report to [place] at the next visit STAT BMET today 2/2 ongoing hyponatremia Patient to follow up with PharmD in 2 weeks    Anxiety Patient has prescriptions for buspirone 5 mg bid and hydroxyzine 10 mg tid prn.  Daughter has been giving her buspirone 5 mg qod (once daily) alternating with hydoxyzine 10 mg (once daily) qod.  Hydroxyzine can cause CNS symptoms as well as fatigue.  Advised daughter to go back to buspirone 5 mg BID every day as a baseline anti-anxiety treatment and only use the hydroxyzine if needed on top of the buspirone.  (Hydroxyzine may be contributing to current symptoms)   Phillips Hay PharmD CPP [facility]  [address] [place], [place] [address] [phone]

## 2023-02-13 NOTE — Patient Instructions (Signed)
Follow up appointment: June 6 (we may change this, but will keep for now)  Go to the lab today to check sodium level  Take your BP meds as follows:  Continue lisinopril 40 mg daily  Take hydralazine 25 mg twice daily every day    Stop hydroxyzine use as it may be causing her fatigue.  Use buspirone twice daily every day  Check your blood pressure at home daily (if able) and keep record of the readings.  Hypertension "High blood pressure"  Hypertension is often called "The Silent Killer." It rarely causes symptoms until it is extremely  high or has done damage to other organs in the body. For this reason, you should have your  blood pressure checked regularly by your physician. We will check your blood pressure  every time you see a provider at one of our offices.   Your blood pressure reading consists of two numbers. Ideally, blood pressure should be  below 120/80. The first ("top") number is called the systolic pressure. It measures the  pressure in your arteries as your heart beats. The second ("bottom") number is called the diastolic pressure. It measures the pressure in your arteries as the heart relaxes between beats.  The benefits of getting your blood pressure under control are enormous. A 10-point  reduction in systolic blood pressure can reduce your risk of stroke by 27% and heart failure by 28%  Your blood pressure goal is < 130/80  To check your pressure at home you will need to:  1. Sit up in a chair, with feet flat on the floor and back supported. Do not cross your ankles or legs. 2. Rest your left arm so that the cuff is about heart level. If the cuff goes on your upper arm,  then just relax the arm on the table, arm of the chair or your lap. If you have a wrist cuff, we  suggest relaxing your wrist against your chest (think of it as Pledging the Flag with the  wrong arm).  3. Place the cuff snugly around your arm, about 1 inch above the crook of your elbow. The   cords should be inside the groove of your elbow.  4. Sit quietly, with the cuff in place, for about 5 minutes. After that 5 minutes press the power  button to start a reading. 5. Do not talk or move while the reading is taking place.  6. Record your readings on a sheet of paper. Although most cuffs have a memory, it is often  easier to see a pattern developing when the numbers are all in front of you.  7. You can repeat the reading after 1-3 minutes if it is recommended  Make sure your bladder is empty and you have not had caffeine or tobacco within the last 30 min  Always bring your blood pressure log with you to your appointments. If you have not brought your monitor in to be double checked for accuracy, please bring it to your next appointment.  You can find a list of quality blood pressure cuffs at validatebp.org

## 2023-02-13 NOTE — Assessment & Plan Note (Signed)
Patient has prescriptions for buspirone 5 mg bid and hydroxyzine 10 mg tid prn.  Daughter has been giving her buspirone 5 mg qod (once daily) alternating with hydoxyzine 10 mg (once daily) qod.  Hydroxyzine can cause CNS symptoms as well as fatigue.  Advised daughter to go back to buspirone 5 mg BID every day as a baseline anti-anxiety treatment and only use the hydroxyzine if needed on top of the buspirone.  (Hydroxyzine may be contributing to current symptoms)

## 2023-02-17 ENCOUNTER — Telehealth: Payer: Self-pay | Admitting: Family Medicine

## 2023-02-17 NOTE — Telephone Encounter (Signed)
Pts daughter states they were told that if after the meds she was taking for itching and it does not get better, he was going to send a referral and daughter states they want the referral to be sent. Please advise.

## 2023-02-18 ENCOUNTER — Other Ambulatory Visit: Payer: Self-pay | Admitting: Family Medicine

## 2023-02-18 ENCOUNTER — Other Ambulatory Visit: Payer: Self-pay | Admitting: *Deleted

## 2023-02-18 DIAGNOSIS — J392 Other diseases of pharynx: Secondary | ICD-10-CM

## 2023-02-18 NOTE — Telephone Encounter (Signed)
Please advise 

## 2023-02-18 NOTE — Telephone Encounter (Signed)
ENT referral placed  Still with throat irritation

## 2023-02-18 NOTE — Telephone Encounter (Signed)
Can we clarify? Are they talking about her throat irritation? If so then it is ok to refer to ENT.  Katina Degree. Jimmey Ralph, MD 02/18/2023 7:50 AM

## 2023-02-26 ENCOUNTER — Encounter: Payer: Self-pay | Admitting: Pharmacist Clinician (PhC)/ Clinical Pharmacy Specialist

## 2023-02-26 ENCOUNTER — Ambulatory Visit: Payer: Medicare Other | Attending: Cardiology | Admitting: Pharmacist Clinician (PhC)/ Clinical Pharmacy Specialist

## 2023-02-26 VITALS — BP 139/73 | HR 66

## 2023-02-26 DIAGNOSIS — I10 Essential (primary) hypertension: Secondary | ICD-10-CM | POA: Insufficient documentation

## 2023-02-26 MED ORDER — HYDRALAZINE HCL 25 MG PO TABS: ORAL_TABLET | ORAL | 3 refills | Status: DC

## 2023-02-26 NOTE — Assessment & Plan Note (Signed)
Assessment: BP is uncontrolled in office BP 139/73 mmHg;  above the goal (<130/80). Patient is now taking lisinopril, metoprolol and hydralazine consistently as prescribed Tolerates all 3 well without any side effects Denies SOB, palpitation, chest pain, headaches,or swelling No sodium restrictions 2/2 low blood level Reiterated the importance of regular exercise  Plan:  Continue taking lisinopril 40 mg qd, metoprolol tart 25 mg bid, hydralazine 25 mg bid, with extra 25 mg hydralazine for BP > 150 systolic Patient to keep record of BP readings with heart rate and report to [place] at the next visit Patient to follow up with Gillian Shields in 3 months  Labs ordered today:  none

## 2023-02-26 NOTE — Progress Notes (Signed)
Office Visit    Patient Name: Lynn [name] Date of Encounter: 02/26/2023  Primary Care Provider:  Ardith Dark, MD Primary Cardiologist:  Donato Schultz, MD  Chief Complaint    Hypertension - [facility]  Past Medical History   Pre-DM 10/23 A1c 6.3  hyperlipidemia 10/23 LDL 66 on atorvastatin 10  hyponatremia Most recent Na 128 (4 days ago)    Allergies  Allergen Reactions   Norvasc [Amlodipine]     SWELLING     History of Present Illness    Lynn [name] is a 82 y.o. female patient who was referred to the [facility].  Her primary cardiologist is Dr. Anne Fu.  She was seen last month by Dr. Duke Salvia, at which time her BP was noted to be 174/69, despite taking higher than recommended dose of losartan (100 mg bid) as well as captopril.  Dr Duke Salvia switched her to lisinopril 40 mg and stopped both the losartan and captopril.   She was also started on spironolactone 12.5 mg daily and hydralazine 25 mg prn SBP > 150.  BMET done 2 weeks later showed potassium up to 6.4.  Lokelma x 1 was given, however patient was seen in ED two days after elevated reading and admitted with hyponatremia.  Sodium was corrected and she was dischargd after 2 days.   At her visit with me 2 weeks ago I switched the hydralazine to 25 mg twice daily instead of prn.    Today she is in the office for follow up.  She looks much better today.  At her last visit she appeared sleepy and did not say much during the visit.  Today she is alert and talking,  Daughter states that they have stopped both buspirone and hydroxyzine, as they were causing her to feel tired/confused/out of sorts.  With the hydralazine switched to daily use, her home BP readings are improved, although she did take extra 25 mg on a couple of occasions when her BP spiked up.    Blood Pressure Goal:  130/80  Current Medications: lisinopril 40 mg qd, metoprolol tart 25 mg bid, hydralazine 25  mg bid  Previously tried:   amlodipine - edema  Social Hx:      Tobacco: no  Alcohol: no  Caffeine:no  Diet: appetite good, grazes rather than big meals, doesn't eat out; mostly vegetables and grilled chicken; tea twice daily; some water -2-3 cups, plus juice and fresh fruit (watermelon)     Exercise: back and knee pain, now tired all the time  Home BP readings:  no readings with them, however daughter states the home readings have all been much better   Accessory Clinical Findings    Lab Results  Component Value Date   CREATININE 0.87 02/13/2023   BUN 17 02/13/2023   NA 129 (L) 02/13/2023   K 4.5 02/13/2023   CL 95 (L) 02/13/2023   CO2 29 02/13/2023   Lab Results  Component Value Date   ALT 18 02/05/2023   AST 23 02/05/2023   ALKPHOS 59 02/05/2023   BILITOT 0.5 02/05/2023   Lab Results  Component Value Date   HGBA1C 6.3 07/18/2022    Screening for Secondary Hypertension:      01/20/2023    4:13 PM  Causes  Drugs/Herbals Screened     - Comments No tobacco,no EtOH, limits salt  Renovascular HTN N/A  Sleep Apnea Screened     - Comments no snoring  Thyroid Disease Screened  Relevant Labs/Studies:    Latest Ref Rng & Units 02/13/2023    2:28 PM 02/10/2023    2:05 PM 02/06/2023    6:25 PM  Basic Labs  Sodium 134 - 144 mmol/L 129  128  128   Potassium 3.5 - 5.2 mmol/L 4.5  4.1  4.3   Creatinine 0.57 - 1.00 mg/dL 1.61  0.96  0.45        Latest Ref Rng & Units 02/05/2023    9:23 PM 09/02/2022    2:46 PM  Thyroid   TSH 0.350 - 4.500 uIU/mL 2.632  2.30        Latest Ref Rng & Units 02/02/2023    9:44 AM  Renin/Aldosterone   Aldosterone 0.0 - 30.0 ng/dL 40.9   Aldos/Renin Ratio 0.0 - 30.0 23.3        Latest Ref Rng & Units 02/02/2023    9:44 AM  Metanephrines/Catecholamines   Epinephrine 0 - 62 pg/mL 49   Norepinephrine 0 - 874 pg/mL 758   Dopamine 0 - 48 pg/mL <30   Metanephrines 0.0 - 88.0 pg/mL <25.0   Normetanephrines  0.0 - 297.2 pg/mL 52.3            09/26/2022   11:02 AM  Renovascular   Renal Artery [place] Completed Yes      Home Medications    Current Outpatient Medications  Medication Sig Dispense Refill   hydrALAZINE (APRESOLINE) 25 MG tablet Take 1 tablet (25 mg total) by mouth in the morning and at bedtime. 60 tablet 3   lisinopril (ZESTRIL) 40 MG tablet Take 1 tablet (40 mg total) by mouth daily. 90 tablet 3   metoprolol tartrate (LOPRESSOR) 25 MG tablet Take 1 tablet (25 mg total) by mouth 2 (two) times daily. 180 tablet 3   aspirin EC 81 MG tablet Take 81 mg by mouth every other day. Swallow whole.     atorvastatin (LIPITOR) 10 MG tablet TAKE 1 TABLET (10 MG TOTAL) BY MOUTH DAILY AT 6 PM. 90 tablet 3   baclofen (LIORESAL) 10 MG tablet Take 0.5-1 tablets (5-10 mg total) by mouth 3 (three) times daily as needed for muscle spasms. 30 each 3   calcium carbonate (OS-CAL - DOSED IN MG OF ELEMENTAL CALCIUM) 1250 (500 Ca) MG tablet Take 1 tablet by mouth 3 (three) times a week.     diclofenac Sodium (VOLTAREN) 1 % GEL Apply 4 g topically 4 (four) times daily as needed. 500 g 3   gabapentin (NEURONTIN) 100 MG capsule Take 1 capsule (100 mg total) by mouth 3 (three) times daily. (Patient not taking: Reported on 02/10/2023) 90 capsule 0   ibuprofen (ADVIL,MOTRIN) 200 MG tablet Take 200 mg by mouth every 6 (six) hours as needed.     meclizine (ANTIVERT) 25 MG tablet Take 1 tablet (25 mg total) by mouth 3 (three) times daily as needed for dizziness. (Patient not taking: Reported on 02/10/2023) 30 tablet 0   Multiple Vitamin (MULTIVITAMIN) tablet Take 1 tablet by mouth 2 (two) times a week.     Pyridoxine HCl (VITAMIN B-6 PO) Take by mouth.     Thiamine HCl (VITAMIN B-1 PO) Take by mouth.     No current facility-administered medications for this visit.     Assessment & Plan       Hypertension Assessment: BP is uncontrolled in office BP 139/73 mmHg;  above the goal (<130/80). Patient is now taking lisinopril, metoprolol and  hydralazine consistently as prescribed Tolerates all 3  well without any side effects Denies SOB, palpitation, chest pain, headaches,or swelling No sodium restrictions 2/2 low blood level Reiterated the importance of regular exercise  Plan:  Continue taking lisinopril 40 mg qd, metoprolol tart 25 mg bid, hydralazine 25 mg bid, with extra 25 mg hydralazine for BP > 150 systolic Patient to keep record of BP readings with heart rate and report to [place] at the next visit Patient to follow up with Gillian Shields in 3 months  Labs ordered today:  none   Phillips Hay PharmD CPP [facility]  [address] [place], [place] [address] [phone]

## 2023-02-26 NOTE — Patient Instructions (Signed)
Follow up appointment: with Lynn [name] in 3 months (we'll call you to set that up)  Take your BP meds as follows:  Continue with  current medications  Check your blood pressure at home daily (if able) and keep record of the readings.  Hypertension "High blood pressure"  Hypertension is often called "The Silent Killer." It rarely causes symptoms until it is extremely  high or has done damage to other organs in the body. For this reason, you should have your  blood pressure checked regularly by your physician. We will check your blood pressure  every time you see a provider at one of our offices.   Your blood pressure reading consists of two numbers. Ideally, blood pressure should be  below 120/80. The first ("top") number is called the systolic pressure. It measures the  pressure in your arteries as your heart beats. The second ("bottom") number is called the diastolic pressure. It measures the pressure in your arteries as the heart relaxes between beats.  The benefits of getting your blood pressure under control are enormous. A 10-point  reduction in systolic blood pressure can reduce your risk of stroke by 27% and heart failure by 28%  Your blood pressure goal is < 130/80  To check your pressure at home you will need to:  1. Sit up in a chair, with feet flat on the floor and back supported. Do not cross your ankles or legs. 2. Rest your left arm so that the cuff is about heart level. If the cuff goes on your upper arm,  then just relax the arm on the table, arm of the chair or your lap. If you have a wrist cuff, we  suggest relaxing your wrist against your chest (think of it as Pledging the Flag with the  wrong arm).  3. Place the cuff snugly around your arm, about 1 inch above the crook of your elbow. The  cords should be inside the groove of your elbow.  4. Sit quietly, with the cuff in place, for about 5 minutes. After that 5 minutes press the power  button to start a  reading. 5. Do not talk or move while the reading is taking place.  6. Record your readings on a sheet of paper. Although most cuffs have a memory, it is often  easier to see a pattern developing when the numbers are all in front of you.  7. You can repeat the reading after 1-3 minutes if it is recommended  Make sure your bladder is empty and you have not had caffeine or tobacco within the last 30 min  Always bring your blood pressure log with you to your appointments. If you have not brought your monitor in to be double checked for accuracy, please bring it to your next appointment.  You can find a list of quality blood pressure cuffs at validatebp.org

## 2023-03-17 ENCOUNTER — Telehealth: Payer: Self-pay | Admitting: Family Medicine

## 2023-03-17 NOTE — Telephone Encounter (Signed)
We can call the ENT office to see but I have no control over their availability.  Lynn [name]. Jimmey Ralph, MD 03/17/2023 12:54 PM

## 2023-03-17 NOTE — Telephone Encounter (Signed)
Patients daughter is requesting help with trying to get the patient in quicker with the ENT specialist than 8/6 which is the quickest they state they can get her in.  They also told her if Dr Jimmey Ralph would let them know it was urgent she needed to get in because the patient coughs constantly day and much worse at night getting no rest at all.   Can someone please call them back.  Thank [facility] TEAM Direct Dial [phone]

## 2023-03-18 ENCOUNTER — Ambulatory Visit (INDEPENDENT_AMBULATORY_CARE_PROVIDER_SITE_OTHER): Payer: Medicare Other | Admitting: Family

## 2023-03-18 VITALS — BP 121/64 | HR 68 | Temp 98.0°F | Ht <= 58 in | Wt 124.2 lb

## 2023-03-18 DIAGNOSIS — H65195 Other acute nonsuppurative otitis media, recurrent, left ear: Secondary | ICD-10-CM | POA: Diagnosis not present

## 2023-03-18 DIAGNOSIS — J029 Acute pharyngitis, unspecified: Secondary | ICD-10-CM

## 2023-03-18 MED ORDER — CIPROFLOXACIN-DEXAMETHASONE 0.3-0.1 % OT SUSP
4.0000 [drp] | Freq: Two times a day (BID) | OTIC | 0 refills | Status: AC
Start: 2023-03-18 — End: 2023-03-25

## 2023-03-18 MED ORDER — AMOXICILLIN 250 MG PO CAPS
250.0000 mg | ORAL_CAPSULE | Freq: Four times a day (QID) | ORAL | 0 refills | Status: AC
Start: 2023-03-18 — End: 2023-03-28

## 2023-03-18 MED ORDER — CEFUROXIME AXETIL 250 MG PO TABS
250.0000 mg | ORAL_TABLET | Freq: Two times a day (BID) | ORAL | 0 refills | Status: AC
Start: 2023-03-18 — End: 2023-03-28

## 2023-03-18 NOTE — Progress Notes (Unsigned)
Patient ID: Lynn [name], female    DOB: 05-17-1941, 82 y.o.   MRN: [id number]  Chief Complaint  Patient presents with   Sinus Problem    Pt c/o sore throat, cough,headache, body aches/chills and jaw pain, Present 2 days ago. Has tried tylenol, throat spray and allergy medications which did not help sx.    *Due to language barrier, an interpreter (pt dtr) was present during the history-taking and subsequent discussion (and for all of the physical exam) with this patient.  HPI:      URI sx:  Pt c/o sore throat, cough,headache, body aches/chills and jaw pain, Present 2 days ago. Dtr present & interpreting Has tried tylenol, throat spray and allergy medications which did not help sx.       Assessment & Plan:  1. Other recurrent acute nonsuppurative otitis media of left ear - sending abt ear drops (pt has used before) and oral AMOX. pt dtr states pt sometimes gets nausea w/AMOX. Dtr asking that I send a lower dose of AMOX in and a back up antibiotic in case she can't keep AMOX down. Sending both & advised on use & SE of all meds.  - ciprofloxacin-dexamethasone (CIPRODEX) OTIC suspension; Place 4 drops into the left ear 2 (two) times daily for 7 days.  Dispense: 7.5 mL; Refill: 0 - amoxicillin (AMOXIL) 250 MG capsule; Take 1 capsule (250 mg total) by mouth in the morning, at noon, in the evening, and at bedtime for 10 days. After eating a meal or snack.  Dispense: 40 capsule; Refill: 0 - cefUROXime (CEFTIN) 250 MG tablet; Take 1 tablet (250 mg total) by mouth 2 (two) times daily with a meal for 10 days. TAKE THIS IF unable to tolerate AMoxicillin  Dispense: 20 tablet; Refill: 0  2. Sore throat - rapid testing neg. Advised pt to take Tylenol 1,000mg   tid prn for sore throat pain, swelling, and fever. Gargle with warm salt water several tid. OK to use OTC Chloraseptic spray and/or throat lozenges prn. Drink plenty of water.    - POC COVID-19 - POCT rapid strep A   Subjective:     Outpatient Medications Prior to Visit  Medication Sig Dispense Refill   aspirin EC 81 MG tablet Take 81 mg by mouth every other day. Swallow whole.     atorvastatin (LIPITOR) 10 MG tablet TAKE 1 TABLET (10 MG TOTAL) BY MOUTH DAILY AT 6 PM. 90 tablet 3   baclofen (LIORESAL) 10 MG tablet Take 0.5-1 tablets (5-10 mg total) by mouth 3 (three) times daily as needed for muscle spasms. 30 each 3   calcium carbonate (OS-CAL - DOSED IN MG OF ELEMENTAL CALCIUM) 1250 (500 Ca) MG tablet Take 1 tablet by mouth 3 (three) times a week.     diclofenac Sodium (VOLTAREN) 1 % GEL Apply 4 g topically 4 (four) times daily as needed. 500 g 3   gabapentin (NEURONTIN) 100 MG capsule Take 1 capsule (100 mg total) by mouth 3 (three) times daily. 90 capsule 0   hydrALAZINE (APRESOLINE) 25 MG tablet Take 1 tablet by mouth twice daily.  May take extra 25 mg for systolic BP > 150 225 tablet 3   ibuprofen (ADVIL,MOTRIN) 200 MG tablet Take 200 mg by mouth every 6 (six) hours as needed.     lisinopril (ZESTRIL) 40 MG tablet Take 1 tablet (40 mg total) by mouth daily. 90 tablet 3   meclizine (ANTIVERT) 25 MG tablet Take 1 tablet (25 mg total) by mouth  3 (three) times daily as needed for dizziness. 30 tablet 0   metoprolol tartrate (LOPRESSOR) 25 MG tablet Take 1 tablet (25 mg total) by mouth 2 (two) times daily. 180 tablet 3   Multiple Vitamin (MULTIVITAMIN) tablet Take 1 tablet by mouth 2 (two) times a week.     Pyridoxine HCl (VITAMIN B-6 PO) Take by mouth.     Thiamine HCl (VITAMIN B-1 PO) Take by mouth.     No facility-administered medications prior to visit.   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 Allergies  Allergen Reactions   Norvasc [Amlodipine]     SWELLING       Objective:     Physical Exam Vitals and nursing note reviewed.  Constitutional:      Appearance: Normal appearance.  HENT:     Right Ear: Tympanic membrane and ear canal normal.     Left Ear: Drainage and tenderness present. Tympanic membrane is erythematous.     Mouth/Throat:     Mouth: Mucous membranes are moist.     Pharynx: No pharyngeal swelling, oropharyngeal exudate, posterior oropharyngeal erythema or uvula swelling.     Tonsils: No tonsillar exudate or tonsillar abscesses.  Cardiovascular:     Rate and Rhythm: Normal rate and regular rhythm.  Pulmonary:     Effort: Pulmonary effort is normal.     Breath sounds: Normal breath sounds.  Musculoskeletal:        General: Normal range of motion.  Lymphadenopathy:     Head:     Right side of head: No submandibular, tonsillar, preauricular, posterior auricular or occipital adenopathy.     Left side of head: No submandibular, tonsillar, preauricular, posterior auricular or occipital adenopathy.     Cervical: No cervical adenopathy.  Skin:    General: Skin is warm and dry.  Neurological:     Mental Status: She is alert.  Psychiatric:        Mood and Affect: Mood normal.        Behavior: Behavior normal.    BP 121/64   Pulse 68   Temp 98 F (36.7 C) (Temporal)   Ht 4\' 9"  (1.448 m)   Wt 124 lb 4 oz (56.4 kg)   SpO2 99%   BMI 26.89 kg/m  Wt Readings from Last 3 Encounters:  03/18/23 124 lb 4 oz (56.4 kg)  02/10/23 124 lb (56.2 kg)  02/07/23 123 lb 7.3 oz (56 kg)       Dulce Sellar, NP

## 2023-03-19 LAB — POCT RAPID STREP A (OFFICE): Rapid Strep A Screen: NEGATIVE

## 2023-03-19 LAB — POC COVID19 BINAXNOW: SARS Coronavirus 2 Ag: NEGATIVE

## 2023-03-24 ENCOUNTER — Ambulatory Visit (INDEPENDENT_AMBULATORY_CARE_PROVIDER_SITE_OTHER): Payer: Medicare Other

## 2023-03-24 VITALS — Wt 124.0 lb

## 2023-03-24 DIAGNOSIS — Z0000 Encounter for general adult medical examination without abnormal findings: Secondary | ICD-10-CM

## 2023-03-24 NOTE — Progress Notes (Addendum)
Subjective:   Lynn [name] is a 82 y.o. female who presents for an Initial Medicare Annual Wellness Visit.  Visit Complete: Virtual  I connected with  [name] on 03/24/23 by a audio enabled telemedicine application and verified that I am speaking with the correct person using two identifiers.  Patient Location: Home  Provider Location: Office/Clinic  I discussed the limitations of evaluation and management by telemedicine. The patient expressed understanding and agreed to proceed.  Review of Systems     Cardiac Risk Factors include: advanced age (>40men, >66 women);hypertension;dyslipidemia     Objective:    Today's Vitals   03/24/23 1445  Weight: 124 lb (56.2 kg)  PainSc: 7    Body mass index is 26.83 kg/m.     03/24/2023    2:52 PM 02/05/2023    3:55 PM 09/26/2022    2:45 PM 11/25/2018    3:55 PM  Advanced Directives  Does Patient Have a Medical Advance Directive? Yes No No No  Type of Estate agent of [place];Living will     Copy of Healthcare Power of Attorney in Chart? No - copy requested     Would patient like information on creating a medical advance directive?  No - Patient declined No - Patient declined Yes (MAU/Ambulatory/Procedural Areas - Information given)    Current Medications (verified) Outpatient Encounter Medications as of 03/24/2023  Medication Sig   amoxicillin (AMOXIL) 250 MG capsule Take 1 capsule (250 mg total) by mouth in the morning, at noon, in the evening, and at bedtime for 10 days. After eating a meal or snack.   aspirin EC 81 MG tablet Take 81 mg by mouth every other day. Swallow whole.   atorvastatin (LIPITOR) 10 MG tablet TAKE 1 TABLET (10 MG TOTAL) BY MOUTH DAILY AT 6 PM.   baclofen (LIORESAL) 10 MG tablet Take 0.5-1 tablets (5-10 mg total) by mouth 3 (three) times daily as needed for muscle spasms.   calcium carbonate (OS-CAL - DOSED IN MG OF ELEMENTAL CALCIUM) 1250 (500 Ca) MG tablet Take  1 tablet by mouth 3 (three) times a week.   cefUROXime (CEFTIN) 250 MG tablet Take 1 tablet (250 mg total) by mouth 2 (two) times daily with a meal for 10 days. TAKE THIS IF unable to tolerate AMoxicillin   ciprofloxacin-dexamethasone (CIPRODEX) OTIC suspension Place 4 drops into the left ear 2 (two) times daily for 7 days.   diclofenac Sodium (VOLTAREN) 1 % GEL Apply 4 g topically 4 (four) times daily as needed.   gabapentin (NEURONTIN) 100 MG capsule Take 1 capsule (100 mg total) by mouth 3 (three) times daily.   hydrALAZINE (APRESOLINE) 25 MG tablet Take 1 tablet by mouth twice daily.  May take extra 25 mg for systolic BP > 150   ibuprofen (ADVIL,MOTRIN) 200 MG tablet Take 200 mg by mouth every 6 (six) hours as needed.   lisinopril (ZESTRIL) 40 MG tablet Take 1 tablet (40 mg total) by mouth daily.   meclizine (ANTIVERT) 25 MG tablet Take 1 tablet (25 mg total) by mouth 3 (three) times daily as needed for dizziness.   metoprolol tartrate (LOPRESSOR) 25 MG tablet Take 1 tablet (25 mg total) by mouth 2 (two) times daily.   Multiple Vitamin (MULTIVITAMIN) tablet Take 1 tablet by mouth 2 (two) times a week.   Pyridoxine HCl (VITAMIN B-6 PO) Take by mouth.   Thiamine HCl (VITAMIN B-1 PO) Take by mouth.   No facility-administered encounter medications on file as  of 03/24/2023.    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 No pertinent surgical history. Family History  Problem Relation Age of Onset   Diverticulitis Mother    Diabetes Mother    Heart attack Father    Other Father        unsure   Heart attack Sister    Hypertension Sister    Stroke Sister    Social History   Socioeconomic History   Marital status: Widowed     Spouse name: Not on file   Number of children: 2   Years of education: some college   Highest education level: Not on file  Occupational History   Occupation: Retired  Tobacco Use   Smoking status: Never   Smokeless tobacco: Never  Vaping Use   Vaping Use: Never used  Substance and Sexual Activity   Alcohol use: No   Drug use: No   Sexual activity: Not Currently  Other Topics Concern   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; clerical work for office   Social Determinants of Corporate investment banker Strain: Low Risk  (03/24/2023)   Overall Financial Resource Strain (CARDIA)    Difficulty of Paying Living Expenses: Not hard at all  Food Insecurity: No Food Insecurity (03/24/2023)   Hunger Vital Sign    Worried About Running Out of Food in the Last Year: Never true    Ran Out of Food in the Last Year: Never true  Transportation Needs: No Transportation Needs (03/24/2023)   PRAPARE - Administrator, Civil Service (Medical): No    Lack of Transportation (Non-Medical): No  Physical Activity: Inactive (03/24/2023)   Exercise Vital Sign    Days of Exercise per Week: 0 days    Minutes of Exercise per Session: 0 min  Stress: No Stress Concern Present (03/24/2023)   [employer] of Occupational Health - Occupational Stress Questionnaire    Feeling of Stress : Not at all  Social Connections: Socially Isolated (03/24/2023)   Social Connection and Isolation Panel [NHANES]    Frequency of Communication with Friends and Family: Twice a week    Frequency of Social Gatherings with Friends and Family: Three times a week    Attends Religious Services: Never    Active Member of Clubs or Organizations: No    Attends Banker Meetings: Never    Marital Status:  Widowed    Tobacco Counseling Counseling given: Not Answered   Clinical Intake:  Pre-visit preparation completed: Yes  Pain : 0-10 Pain Score: 7  Pain Type: Chronic pain     BMI - recorded: 26.83 Nutritional Status: BMI 25 -29 Overweight Diabetes: No  How often do you need to have someone help you when you read instructions, pamphlets, or other written materials from your doctor or pharmacy?: 1 - Never  Interpreter Needed?: No  Information entered by :: Lanier Ensign, LPN   Activities of Daily Living    03/24/2023  2:47 PM 02/06/2023    1:59 AM  In your present state of health, do you have any difficulty performing the following activities:  Hearing? 1   Comment hoh   Vision? 0   Difficulty concentrating or making decisions? 0   Walking or climbing stairs? 0   Dressing or bathing? 0   Doing errands, shopping? 0 0  Preparing Food and eating ? Y   Comment daughter assist   Using the Toilet? N   In the past six months, have you accidently leaked urine? N   Do you have problems with loss of bowel control? N   Managing your Medications? Y   Comment daughter assist   Managing your Finances? Y   Housekeeping or managing your Housekeeping? Y     Patient Care Team: Ardith Dark, MD as PCP - General (Family Medicine) Jake Bathe, MD as PCP - Cardiology (Cardiology)  Indicate any recent Medical Services you may have received from other than [facility] providers in the past year (date may be approximate).     Assessment:   This is a routine wellness examination for Makenah.  Hearing/Vision screen Hearing Screening - Comments:: Pt HOH  Vision Screening - Comments:: Pt follows up with Dr Elmer Picker for annual eye exams   Dietary issues and exercise activities discussed:     Goals Addressed             This Visit's Progress    Patient Stated       None at this time        Depression Screen    03/24/2023    2:50 PM 10/29/2022   10:20 AM 10/29/2022   10:19  AM 07/18/2022   11:46 AM 05/22/2022   11:48 AM 03/10/2022    2:46 PM 01/09/2021    9:57 AM  PHQ 2/9 Scores  PHQ - 2 Score 1 0 0 0 0 0 0    Fall Risk    03/24/2023    2:52 PM 01/27/2023   10:35 AM 10/29/2022   10:20 AM 07/18/2022   11:51 AM 05/22/2022   11:48 AM  Fall Risk   Falls in the past year? 0 0 0 0 0  Number falls in past yr: 0 0 0 0 0  Injury with Fall? 0 0 0 0 0  Risk for fall due to : Impaired vision;Impaired balance/gait;Impaired mobility No Fall Risks No Fall Risks  No Fall Risks  Follow up Falls prevention discussed        MEDICARE RISK AT HOME:  Medicare Risk at Home - 03/24/23 1453     Any stairs in or around the home? Yes    If so, are there any without handrails? No    Home free of loose throw rugs in walkways, pet beds, electrical cords, etc? Yes    Adequate lighting in your home to reduce risk of falls? Yes    Life alert? Yes    Use of a cane, walker or w/c? Yes    Grab bars in the bathroom? Yes    Shower chair or bench in shower? Yes    Elevated toilet seat or a handicapped toilet? Yes             TIMED UP AND GO:  Was the test performed? No    Cognitive Function:Declined     10/27/2018    9:00 AM 06/14/2018    9:45 AM 06/14/2018    9:41 AM 01/29/2018    2:00  PM  MMSE - Mini Mental State Exam  Orientation to time 2 5 5 5   Orientation to Place 2 4 4 5   Registration 3 3 3 3   Attention/ Calculation 0 5 5 5   Recall 2 2 2 3   Language- name 2 objects 2 2 2 1   Language- repeat 0 1 1 1   Language- follow 3 step command 1 3 3 3   Language- read & follow direction 1 1 1    Language-read & follow direction-comments    Unable to perform due to language deficit  Write a sentence 1 1 1 1   Copy design 0 1 1 1   Total score 14 28 28          Immunizations Immunization History  Administered Date(s) Administered   Fluad Quad(high Dose 65+) 07/28/2022   Influenza,inj,Quad PF,6+ Mos 05/12/2018, 07/01/2019, 07/19/2020   Influenza-Unspecified 06/21/2021    PFIZER(Purple Top)SARS-COV-2 Vaccination 11/05/2019, 11/30/2019, 09/07/2020, 06/05/2021   PNEUMOCOCCAL CONJUGATE-20 05/22/2022   Zoster Recombinant(Shingrix) 05/22/2022, 10/15/2022      Flu Vaccine status: Up to date  Pneumococcal vaccine status: Up to date  Covid-19 vaccine status: Completed vaccines  Qualifies for Shingles Vaccine? Yes   Zostavax completed Yes   Shingrix Completed?: Yes  Screening Tests Health Maintenance  Topic Date Due   COVID-19 Vaccine (5 - 2023-24 season) 05/23/2022   INFLUENZA VACCINE  04/23/2023   Medicare Annual Wellness (AWV)  03/23/2024   Pneumonia Vaccine 30+ Years old  Completed   DEXA SCAN  Completed   Zoster Vaccines- Shingrix  Completed   HPV VACCINES  Aged Out   DTaP/Tdap/Td  Discontinued   Hepatitis C Screening  Discontinued    Health Maintenance  Health Maintenance Due  Topic Date Due   COVID-19 Vaccine (5 - 2023-24 season) 05/23/2022    Colorectal cancer screening: No longer required.   Mammogram status: Ordered 01/31/21. Pt provided with contact info and advised to call to schedule appt.   Bone Density status: Completed 11/04/18. Results reflect: Bone density results: OSTEOPENIA. Repeat every 2 years.   Additional Screening:  Vision Screening: Recommended annual ophthalmology exams for early detection of glaucoma and other disorders of the eye. Is the patient up to date with their annual eye exam?  Yes  Who is the provider or what is the name of the office in which the patient attends annual eye exams? [facility]  If pt is not established with a provider, would they like to be referred to a provider to establish care? No .   Dental Screening: Recommended annual dental exams for proper oral hygiene    Community Resource Referral / Chronic Care Management: CRR required this visit?  No   CCM required this visit?  No     Plan:     I have personally reviewed and noted the following in the patient's chart:   Medical  and social history Use of alcohol, tobacco or illicit drugs  Current medications and supplements including opioid prescriptions. Patient is not currently taking opioid prescriptions. Functional ability and status Nutritional status Physical activity Advanced directives List of other physicians Hospitalizations, surgeries, and ER visits in previous 12 months Vitals Screenings to include cognitive, depression, and falls Referrals and appointments  In addition, I have reviewed and discussed with patient certain preventive protocols, quality metrics, and best practice recommendations. A written personalized care plan for preventive services as well as general preventive health recommendations were provided to patient.     Marzella Schlein, LPN   4/[id number]/9811  After Visit Summary: (MyChart) Due to this being a telephonic visit, the after visit summary with patients personalized plan was offered to patient via MyChart   Nurse Notes: pt has been having a nagging cough not sure what it is from and wants it to be resolved it is affecting their sleep at night. Pt HOH unable to complete cognition testing

## 2023-03-24 NOTE — Patient Instructions (Signed)
Lynn [name] , Thank you for taking time to come for your Medicare Wellness Visit. I appreciate your ongoing commitment to your health goals. Please review the following plan we discussed and let me know if I can assist you in the future.   These are the goals we discussed:  Goals      Patient Stated     None at this time         This is a list of the screening recommended for you and due dates:  Health Maintenance  Topic Date Due   COVID-19 Vaccine (5 - 2023-24 season) 05/23/2022   Flu Shot  04/23/2023   Medicare Annual Wellness Visit  03/23/2024   Pneumonia Vaccine  Completed   DEXA scan (bone density measurement)  Completed   Zoster (Shingles) Vaccine  Completed   HPV Vaccine  Aged Out   DTaP/Tdap/Td vaccine  Discontinued   Hepatitis C Screening  Discontinued    Advanced directives: Advance directive discussed with you today. Even though you declined this today please call our office should you change your mind and we can give you the proper paperwork for you to fill out.  Conditions/risks identified: none at this time   Next appointment: Follow up in one year for your annual wellness visit    Preventive Care 65 Years and Older, Female Preventive care refers to lifestyle choices and visits with your health care provider that can promote health and wellness. What does preventive care include? A yearly physical exam. This is also called an annual well check. Dental exams once or twice a year. Routine eye exams. Ask your health care provider how often you should have your eyes checked. Personal lifestyle choices, including: Daily care of your teeth and gums. Regular physical activity. Eating a healthy diet. Avoiding tobacco and drug use. Limiting alcohol use. Practicing safe sex. Taking low-dose aspirin every day. Taking vitamin and mineral supplements as recommended by your health care provider. What happens during an annual well check? The services and  screenings done by your health care provider during your annual well check will depend on your age, overall health, lifestyle risk factors, and family history of disease. Counseling  Your health care provider may ask you questions about your: Alcohol use. Tobacco use. Drug use. Emotional well-being. Home and relationship well-being. Sexual activity. Eating habits. History of falls. Memory and ability to understand (cognition). Work and work Astronomer. Reproductive health. Screening  You may have the following tests or measurements: Height, weight, and BMI. Blood pressure. Lipid and cholesterol levels. These may be checked every 5 years, or more frequently if you are over 29 years old. Skin check. Lung cancer screening. You may have this screening every year starting at age 5 if you have a 30-pack-year history of smoking and currently smoke or have quit within the past 15 years. Fecal occult blood test (FOBT) of the stool. You may have this test every year starting at age 50. Flexible sigmoidoscopy or colonoscopy. You may have a sigmoidoscopy every 5 years or a colonoscopy every 10 years starting at age 61. Hepatitis C blood test. Hepatitis B blood test. Sexually transmitted disease (STD) testing. Diabetes screening. This is done by checking your blood sugar (glucose) after you have not eaten for a while (fasting). You may have this done every 1-3 years. Bone density scan. This is done to screen for osteoporosis. You may have this done starting at age 83. Mammogram. This may be done every 1-2 years. Talk to  your health care provider about how often you should have regular mammograms. Talk with your health care provider about your test results, treatment options, and if necessary, the need for more tests. Vaccines  Your health care provider may recommend certain vaccines, such as: Influenza vaccine. This is recommended every year. Tetanus, diphtheria, and acellular pertussis (Tdap,  Td) vaccine. You may need a Td booster every 10 years. Zoster vaccine. You may need this after age 33. Pneumococcal 13-valent conjugate (PCV13) vaccine. One dose is recommended after age 64. Pneumococcal polysaccharide (PPSV23) vaccine. One dose is recommended after age 30. Talk to your health care provider about which screenings and vaccines you need and how often you need them. This information is not intended to replace advice given to you by your health care provider. Make sure you discuss any questions you have with your health care provider. Document Released: 10/05/2015 Document Revised: 05/28/2016 Document Reviewed: 07/10/2015 Elsevier Interactive Patient Education  2017 [place] Prevention in the Home Falls can cause injuries. They can happen to people of all ages. There are many things you can do to make your home safe and to help prevent falls. What can I do on the outside of my home? Regularly fix the edges of walkways and driveways and fix any cracks. Remove anything that might make you trip as you walk through a door, such as a raised step or threshold. Trim any bushes or trees on the path to your home. Use bright outdoor lighting. Clear any walking paths of anything that might make someone trip, such as rocks or tools. Regularly check to see if handrails are loose or broken. Make sure that both sides of any steps have handrails. Any raised decks and porches should have guardrails on the edges. Have any leaves, snow, or ice cleared regularly. Use sand or salt on walking paths during winter. Clean up any spills in your garage right away. This includes oil or grease spills. What can I do in the bathroom? Use night lights. Install grab bars by the toilet and in the tub and shower. Do not use towel bars as grab bars. Use non-skid mats or decals in the tub or shower. If you need to sit down in the shower, use a plastic, non-slip stool. Keep the floor dry. Clean up any  water that spills on the floor as soon as it happens. Remove soap buildup in the tub or shower regularly. Attach bath mats securely with double-sided non-slip rug tape. Do not have throw rugs and other things on the floor that can make you trip. What can I do in the bedroom? Use night lights. Make sure that you have a light by your bed that is easy to reach. Do not use any sheets or blankets that are too big for your bed. They should not hang down onto the floor. Have a firm chair that has side arms. You can use this for support while you get dressed. Do not have throw rugs and other things on the floor that can make you trip. What can I do in the kitchen? Clean up any spills right away. Avoid walking on wet floors. Keep items that you use a lot in easy-to-reach places. If you need to reach something above you, use a strong step stool that has a grab bar. Keep electrical cords out of the way. Do not use floor polish or wax that makes floors slippery. If you must use wax, use non-skid floor wax. Do not  have throw rugs and other things on the floor that can make you trip. What can I do with my stairs? Do not leave any items on the stairs. Make sure that there are handrails on both sides of the stairs and use them. Fix handrails that are broken or loose. Make sure that handrails are as long as the stairways. Check any carpeting to make sure that it is firmly attached to the stairs. Fix any carpet that is loose or worn. Avoid having throw rugs at the top or bottom of the stairs. If you do have throw rugs, attach them to the floor with carpet tape. Make sure that you have a light switch at the top of the stairs and the bottom of the stairs. If you do not have them, ask someone to add them for you. What else can I do to help prevent falls? Wear shoes that: Do not have high heels. Have rubber bottoms. Are comfortable and fit you well. Are closed at the toe. Do not wear sandals. If you use a  stepladder: Make sure that it is fully opened. Do not climb a closed stepladder. Make sure that both sides of the stepladder are locked into place. Ask someone to hold it for you, if possible. Clearly mark and make sure that you can see: Any grab bars or handrails. First and last steps. Where the edge of each step is. Use tools that help you move around (mobility aids) if they are needed. These include: Canes. Walkers. Scooters. Crutches. Turn on the lights when you go into a dark area. Replace any light bulbs as soon as they burn out. Set up your furniture so you have a clear path. Avoid moving your furniture around. If any of your floors are uneven, fix them. If there are any pets around you, be aware of where they are. Review your medicines with your doctor. Some medicines can make you feel dizzy. This can increase your chance of falling. Ask your doctor what other things that you can do to help prevent falls. This information is not intended to replace advice given to you by your health care provider. Make sure you discuss any questions you have with your health care provider. Document Released: 07/05/2009 Document Revised: 02/14/2016 Document Reviewed: 10/13/2014 Elsevier Interactive Patient Education  2017 [employer].

## 2023-04-28 DIAGNOSIS — T464X5A Adverse effect of angiotensin-converting-enzyme inhibitors, initial encounter: Secondary | ICD-10-CM | POA: Insufficient documentation

## 2023-05-11 ENCOUNTER — Other Ambulatory Visit: Payer: Self-pay | Admitting: Physician Assistant

## 2023-05-12 ENCOUNTER — Other Ambulatory Visit: Payer: Self-pay | Admitting: Cardiovascular Disease

## 2023-05-12 ENCOUNTER — Telehealth: Payer: Self-pay | Admitting: Cardiovascular Disease

## 2023-05-12 DIAGNOSIS — I10 Essential (primary) hypertension: Secondary | ICD-10-CM

## 2023-05-12 MED ORDER — OLMESARTAN MEDOXOMIL 40 MG PO TABS: 40.0000 mg | ORAL_TABLET | Freq: Every day | ORAL | 1 refills | Status: DC

## 2023-05-12 NOTE — Telephone Encounter (Signed)
Returned call to patient and daughter,   She is having side effects with the medication.   She states her blood pressure has been good. 130s/80s, with the occasional 140s. They are worried about her electrolytes, since they had such trouble after her last medication change.

## 2023-05-12 NOTE — Telephone Encounter (Signed)
Returned call to patient and family, reviewed medications with patient. Nervous about potential affect on sodium or potassium, they will get labs one to two weeks after starting new medication.

## 2023-05-12 NOTE — Telephone Encounter (Signed)
Let's stop the lisinopril and switch to olmesartan 40 mg daily.  If ACEI cough, should resolve in 1-4 weeks.

## 2023-05-12 NOTE — Telephone Encounter (Signed)
Rx request sent to pharmacy.  

## 2023-05-12 NOTE — Telephone Encounter (Signed)
Pt c/o medication issue:  1. Name of Medication:   lisinopril (ZESTRIL) 40 MG tablet    2. How are you currently taking this medication (dosage and times per day)? As written   3. Are you having a reaction (difficulty breathing--STAT)? No   4. What is your medication issue? Pt daughter called in stating since taking this medication, pt has been coughing and her eyes have been red and teary. She asked if this is a normal side effect for this med.

## 2023-05-14 ENCOUNTER — Telehealth: Payer: Self-pay | Admitting: Cardiovascular Disease

## 2023-05-14 ENCOUNTER — Encounter: Payer: Self-pay | Admitting: Family

## 2023-05-14 ENCOUNTER — Ambulatory Visit (INDEPENDENT_AMBULATORY_CARE_PROVIDER_SITE_OTHER): Payer: Medicare Other | Admitting: Family

## 2023-05-14 VITALS — BP 160/71 | HR 66 | Temp 97.8°F | Ht <= 58 in | Wt 125.5 lb

## 2023-05-14 DIAGNOSIS — R053 Chronic cough: Secondary | ICD-10-CM

## 2023-05-14 MED ORDER — OLMESARTAN MEDOXOMIL 40 MG PO TABS: 40.0000 mg | ORAL_TABLET | Freq: Every day | ORAL | 1 refills | Status: DC

## 2023-05-14 MED ORDER — PREDNISONE 20 MG PO TABS: ORAL_TABLET | ORAL | 0 refills | Status: DC

## 2023-05-14 NOTE — Telephone Encounter (Signed)
Prescription sent to pharmacy again as requested.

## 2023-05-14 NOTE — Telephone Encounter (Signed)
Pt c/o medication issue:  1. Name of Medication: olmesartan (BENICAR) 40 MG tablet   2. How are you currently taking this medication (dosage and times per day)? Take 1 tablet (40 mg total) by mouth daily.   3. Are you having a reaction (difficulty breathing--STAT)? No  4. What is your medication issue? Patient's daughter is requesting we resubmit the refill request due to the pharmacy stating they have not received the medication prescription. Please advise.

## 2023-05-14 NOTE — Progress Notes (Signed)
Patient ID: Lynn [name], female    DOB: [birth year], 82 y.o.   MRN: [id number]  Chief Complaint  Patient presents with   Cough    Pt c/o cough for 2 months due to Lisinopril prescribed. Pt states she has a cough with mucus but unsure of color and wheezing.     HPI:      Persistent cough:  has had dry cough since starting Lisinopril, but the last 6 days it has become more wet with mucus. She took Lisinopril today, dtr says she is going to pick up new RX Olmesartan today.        Assessment & Plan:  1. Persistent cough- lungs with mild rhonchi in upper lobes, dry cough turned into wet now, dtr concerned, has heard her every day and can tell change. Sending low dose pred pack, advised on use & SE, advised to stop the Lisinopril & start the Olmesartan today. Advised on increased water intake, call the office if cough is not improving after finishing med. Did advise per pharmacist note in chart, post ACE-I cough could linger up to 4w.  - predniSONE (DELTASONE) 20 MG tablet; Take 2 pills in the morning with breakfast for 3 days, then 1 pill for 2 days  Dispense: 8 tablet; Refill: 0   Subjective:    Outpatient Medications Prior to Visit  Medication Sig Dispense Refill   aspirin EC 81 MG tablet Take 81 mg by mouth every other day. Swallow whole.     atorvastatin (LIPITOR) 10 MG tablet TAKE 1 TABLET (10 MG TOTAL) BY MOUTH DAILY AT 6 PM. 90 tablet 3   baclofen (LIORESAL) 10 MG tablet Take 0.5-1 tablets (5-10 mg total) by mouth 3 (three) times daily as needed for muscle spasms. 30 each 3   calcium carbonate (OS-CAL - DOSED IN MG OF ELEMENTAL CALCIUM) 1250 (500 Ca) MG tablet Take 1 tablet by mouth 3 (three) times a week.     diclofenac Sodium (VOLTAREN) 1 % GEL APPLY 4 G TOPICALLY 4 (FOUR) TIMES DAILY AS NEEDED. 500 g 3   gabapentin (NEURONTIN) 100 MG capsule Take 1 capsule (100 mg total) by mouth 3 (three) times daily. 90 capsule 0   hydrALAZINE (APRESOLINE) 25 MG tablet TAKE 1 TABLET  (25 MG) BY MOUTH IN THE MORNING AND AT BEDTIME 180 tablet 1   ibuprofen (ADVIL,MOTRIN) 200 MG tablet Take 200 mg by mouth every 6 (six) hours as needed.     meclizine (ANTIVERT) 25 MG tablet Take 1 tablet (25 mg total) by mouth 3 (three) times daily as needed for dizziness. 30 tablet 0   metoprolol tartrate (LOPRESSOR) 25 MG tablet Take 1 tablet (25 mg total) by mouth 2 (two) times daily. 180 tablet 3   Multiple Vitamin (MULTIVITAMIN) tablet Take 1 tablet by mouth 2 (two) times a week.     Pyridoxine HCl (VITAMIN B-6 PO) Take by mouth.     Thiamine HCl (VITAMIN B-1 PO) Take by mouth.     olmesartan (BENICAR) 40 MG tablet Take 1 tablet (40 mg total) by mouth daily. 90 tablet 1   No facility-administered medications prior to visit.   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 Allergies  Allergen Reactions   Norvasc [Amlodipine]  SWELLING       Objective:    Physical Exam Vitals and nursing note reviewed.  Constitutional:      Appearance: Normal appearance.  Cardiovascular:     Rate and Rhythm: Normal rate and regular rhythm.  Pulmonary:     Effort: Pulmonary effort is normal.     Breath sounds: Examination of the right-upper field reveals rhonchi. Examination of the left-upper field reveals rhonchi. Rhonchi (anteriorally) present.  Musculoskeletal:        General: Normal range of motion.  Skin:    General: Skin is warm and dry.  Neurological:     Mental Status: She is alert.  Psychiatric:        Mood and Affect: Mood normal.        Behavior: Behavior normal.    BP (!) 160/71   Pulse 66   Temp 97.8 F (36.6 C) (Temporal)   Ht 4\' 9"  (1.448 m)   Wt 125 lb 8 oz (56.9 kg)   SpO2 98%   BMI 27.16 kg/m  Wt Readings from Last 3 Encounters:  05/14/23 125  lb 8 oz (56.9 kg)  03/24/23 124 lb (56.2 kg)  03/18/23 124 lb 4 oz (56.4 kg)      Dulce Sellar, NP

## 2023-05-21 ENCOUNTER — Telehealth (HOSPITAL_BASED_OUTPATIENT_CLINIC_OR_DEPARTMENT_OTHER): Payer: Self-pay | Admitting: Cardiovascular Disease

## 2023-05-21 NOTE — Telephone Encounter (Signed)
Hey, you saw this patient last, I am not sure what else to do with them. They want to go back to whatever medication she was on 2 months ago, before the lisinopril.... I am here alone today, is there anyway you can look into this for me?

## 2023-05-21 NOTE — Telephone Encounter (Signed)
Patient and patient daughter came in because she would like for someone to call her due to her taking several medication and having side effects. Lisinopril makes her cough Olmesartan Medoxomil makes her dizzy  Metoprolol and hydrazine no problems with medication.  Routed to both [place] wasn't sure who I needed to route to

## 2023-05-22 LAB — LIPID PANEL: Chol/HDL Ratio: 2.3 ratio (ref 0.0–4.4)

## 2023-05-22 NOTE — Telephone Encounter (Signed)
My best guess in reading her chart is that she was taking losartan 100 mg twice daily and captopril as needed.  Since she got an ACEI cough, the captopril is out.  She can go back on losartan 100 mg, but need to stress that it's once daily.   She was also on hydralazine 25 mg bid.  Not sure if this is what she's referring to, but my best guess is the losartan.

## 2023-05-26 NOTE — Telephone Encounter (Signed)
This one

## 2023-05-26 NOTE — Telephone Encounter (Signed)
Spoke with daughter and ok to change her Olmesartan to Losartan 50 mg 1 tablet morning and evening per request Per daughter blood pressure still high, today 161/81  Advised to monitor and log blood pressure, bring to visit next week

## 2023-06-04 ENCOUNTER — Ambulatory Visit (INDEPENDENT_AMBULATORY_CARE_PROVIDER_SITE_OTHER): Payer: Medicare Other | Admitting: Family

## 2023-06-04 ENCOUNTER — Encounter (HOSPITAL_BASED_OUTPATIENT_CLINIC_OR_DEPARTMENT_OTHER): Payer: Self-pay | Admitting: Family

## 2023-06-04 VITALS — BP 124/64 | HR 58 | Ht <= 58 in | Wt 122.0 lb

## 2023-06-04 DIAGNOSIS — I10 Essential (primary) hypertension: Secondary | ICD-10-CM | POA: Diagnosis not present

## 2023-06-04 DIAGNOSIS — R42 Dizziness and giddiness: Secondary | ICD-10-CM

## 2023-06-04 DIAGNOSIS — E782 Mixed hyperlipidemia: Secondary | ICD-10-CM | POA: Diagnosis not present

## 2023-06-04 NOTE — Patient Instructions (Signed)
Medication Instructions:  Your physician recommends that you continue on your current medications as directed. Please refer to the Current Medication list given to you today.   Testing/Procedures: Your physician has requested that you have a carotid duplex. This test is an ultrasound of the carotid arteries in your neck. It looks at blood flow through these arteries that supply the brain with blood. Allow one hour for this exam. There are no restrictions or special instructions.   Follow-Up: At [facility], you and your health needs are our priority.  As part of our continuing mission to provide you with exceptional heart care, we have created designated Provider Care Teams.  These Care Teams include your primary Cardiologist (physician) and Advanced Practice Providers (APPs -  Physician Assistants and Nurse Practitioners) who all work together to provide you with the care you need, when you need it.  We recommend signing up for the patient portal called "MyChart".  Sign up information is provided on this After Visit Summary.  MyChart is used to connect with patients for Virtual Visits (Telemedicine).  Patients are able to view lab/test results, encounter notes, upcoming appointments, etc.  Non-urgent messages can be sent to your provider as well.   To learn more about what you can do with MyChart, go to [email or web address].    Your next appointment:   2 months with Dr. Duke Salvia or Gillian Shields, NP in [facility]

## 2023-06-04 NOTE — Progress Notes (Signed)
Advanced Hypertension Clinic Assessment:    Date:  06/04/2023   ID:  Lynn [name], DOB 1941-04-29, MRN [id number]  PCP:  Ardith Dark, MD  Cardiologist:  Donato Schultz, MD  Nephrologist:  Referring MD: Ardith Dark, MD   CC: Hypertension  History of Present Illness:    Lynn [name] is a 82 y.o. female with a hx of hypertension, prediabetes, moderate cognitive impairment, hyperlipidemia here to follow up in the Advanced Hypertension Clinic.   Established with [facility] Clinic 01/20/23 after referral from Dr. Anne Fu.  Initially on metoprolol, losartan.  HCTZ 12.5 mg later added and then later discontinued.  It was noted 1 point family members were giving her ACE and ARB because they felt it was better controlling her blood pressure than what the doctor had prescribed.  Anxiety has been contributory to labile BP.  ED visit 09/26/2022 for hyponatremia and lightheadedness with BP 139/53.  MRI negative for stroke.  Renal Dopplers 09/2022 normal flow bilaterally.  Initial advanced hypertension clinic visit 10/01/2022 BP uncontrolled and also orthostatic.  BP increased from 178/82 laying to 205/84 standing.  She had been taking higher doses of ARB's and recommended (losartan 100 mg twice daily).  Given she preferred 80s she was recommended to switch to lisinopril 40 mg daily.  Losartan and Catapres stopped.  She was given 25 mg hydralazine to take as needed for SBP greater than 150.  Spironolactone 12.5 mg daily was started.  Admitted 5/16/5/18/24 with hyponatremia NA 121 provided with IVF.  Metoprolol was held due to bradycardia.  Pharmacy visit 02/13/2023 BP in clinic 123/76 on his transition to hydralazine 25 mg twice daily.  Spironolactone have been discontinued due to hyperkalemia.  At pharmacy visit 02/26/2023 BP was mildly elevated.  Lisinopril 40 mg daily, metoprolol titrate 25 mg twice daily, try dose of 25 mg twice daily continue with additional 25 mg of  hydralazine for SBP greater than 150.  Presents today for follow-up. Multiple phone encounters since last visit. Lisinopril switched to Olmesartan due to cough. But had dizziness and transitioned to Losartan 50mg  BID.   Visit assisted by her daughter, additional interpretor declined.   Notes her head has felt foggy and heavy for two months most every day. Not described as headache, describes as "pressure" but no nasal congestion nor sinus tenderness. Feels her vision is slightly impaired and blurry. Currently has eye glasses which she uses just for reading. Reports no amaurosis fugax. Does note history of left eye cataract and said eye doctor two months saw nothing new. Reports episodes of dizziness not described as spinning nor needing to sit but feeling "uncomfortable". No near syncope, syncope. This has persistent despite multiple medication changes. No hypotensive BP readings.  Since transition back to Losartan her daughter her BP is occasionally high such as SBP in the 105s which is when she feels more foggy, dizzy. She did give an extra dose of Hydralazine. Taking morning medications around 11am and 7pm.   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 No pertinent surgical history.  Current Medications: Current Meds  Medication Sig   aspirin EC 81 MG tablet Take 81  mg by mouth every other day. Swallow whole.   atorvastatin (LIPITOR) 10 MG tablet TAKE 1 TABLET (10 MG TOTAL) BY MOUTH DAILY AT 6 PM.   baclofen (LIORESAL) 10 MG tablet Take 0.5-1 tablets (5-10 mg total) by mouth 3 (three)  times daily as needed for muscle spasms.   calcium carbonate (OS-CAL - DOSED IN MG OF ELEMENTAL CALCIUM) 1250 (500 Ca) MG tablet Take 1 tablet by mouth 3 (three) times a week.   diclofenac Sodium (VOLTAREN) 1 % GEL APPLY 4 G TOPICALLY 4 (FOUR) TIMES DAILY AS NEEDED.   gabapentin (NEURONTIN) 100 MG capsule Take 1 capsule (100 mg total) by mouth 3 (three) times daily.   hydrALAZINE (APRESOLINE) 25 MG tablet TAKE 1 TABLET (25 MG) BY MOUTH IN THE MORNING AND AT BEDTIME   ibuprofen (ADVIL,MOTRIN) 200 MG tablet Take 200 mg by mouth every 6 (six) hours as needed.   losartan (COZAAR) 50 MG tablet Take 50 mg by mouth in the morning and at bedtime.   meclizine (ANTIVERT) 25 MG tablet Take 1 tablet (25 mg total) by mouth 3 (three) times daily as needed for dizziness.   metoprolol tartrate (LOPRESSOR) 25 MG tablet Take 1 tablet (25 mg total) by mouth 2 (two) times daily.   Multiple Vitamin (MULTIVITAMIN) tablet Take 1 tablet by mouth 2 (two) times a week.   predniSONE (DELTASONE) 20 MG tablet Take 2 pills in the morning with breakfast for 3 days, then 1 pill for 2 days   Pyridoxine HCl (VITAMIN B-6 PO) Take by mouth.   Thiamine HCl (VITAMIN B-1 PO) Take by mouth.     Allergies:   Norvasc [amlodipine] and Olmesartan   Social History   Socioeconomic History   Marital status: Widowed    Spouse name: Not on file   Number of children: 2   Years of education: some college   Highest education level: Not on file  Occupational History   Occupation: Retired  Tobacco Use   Smoking status: Never   Smokeless tobacco: Never  Vaping Use   Vaping status: Never Used  Substance and Sexual Activity   Alcohol use: No   Drug use: No   Sexual activity: Not Currently  Other Topics Concern   Not on file  Social History Narrative   Pt lives with her daughter and her daughter's family (husband and 2 children) in 2 story home   Has 2 adult children   Some college education - however credits did not transfer to [place]    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; clerical work for office   Social Determinants of Corporate investment banker Strain: Low Risk  (03/24/2023)   Overall Financial Resource Strain (CARDIA)    Difficulty of Paying Living Expenses: Not hard at all  Food Insecurity: No Food Insecurity (03/24/2023)   Hunger Vital Sign    Worried About Running Out of Food in the Last Year: Never true    Ran Out of Food in the Last Year: Never true  Transportation Needs: No Transportation Needs (03/24/2023)   PRAPARE - Administrator, Civil Service (Medical): No    Lack of Transportation (Non-Medical): No  Physical Activity: Inactive (03/24/2023)   Exercise Vital Sign    Days of Exercise per Week: 0 days    Minutes of Exercise per Session: 0 min  Stress: No Stress Concern Present (03/24/2023)   [employer] of Occupational Health - Occupational Stress Questionnaire    Feeling of Stress : Not at all  Social Connections: Socially Isolated (03/24/2023)   Social Connection and Isolation Panel [NHANES]    Frequency of Communication with Friends and Family: Twice a week    Frequency of Social Gatherings with Friends and Family: Three times a week    Attends Religious Services: Never    Active Member of Clubs or Organizations: No    Attends Banker Meetings: Never    Marital Status: Widowed     Family History: The patient's family history includes Diabetes in her mother; Diverticulitis in her mother; Heart attack in her father and sister; Hypertension in her sister; Other in her father; Stroke in her sister.  ROS:   Please see the history of present illness.     All other systems reviewed and are negative.  EKGs/Labs/Other Studies Reviewed:         Recent Labs: 02/05/2023: ALT 18; TSH 2.632 02/06/2023: Hemoglobin 11.1; Platelets 245 05/21/2023: BUN 19; Creatinine, Ser 1.04; Potassium 5.0; Sodium 133   Recent Lipid Panel    Component Value Date/Time   CHOL 139 05/21/2023 1319   TRIG 97  05/21/2023 1319   HDL 60 05/21/2023 1319   CHOLHDL 2.3 05/21/2023 1319   CHOLHDL 2 07/18/2022 1230   VLDL 16.4 07/18/2022 1230   LDLCALC 61 05/21/2023 1319    Physical Exam:   VS:  BP 124/64   Pulse (!) 58   Ht 4\' 9"  (1.448 m)   Wt 122 lb (55.3 kg)   BMI 26.40 kg/m  , BMI Body mass index is 26.4 kg/m. GENERAL:  Well appearing HEENT: Pupils equal round and reactive, fundi not visualized, oral mucosa unremarkable NECK:  No jugular venous distention, waveform within normal limits, carotid upstroke brisk and symmetric, no bruits, no thyromegaly LYMPHATICS:  No cervical adenopathy LUNGS:  Clear to auscultation bilaterally HEART:  RRR.  PMI not displaced or sustained,S1 and S2 within normal limits, no S3, no S4, no clicks, no rubs, no murmurs ABD:  Flat, positive bowel sounds normal in frequency in pitch, no bruits, no rebound, no guarding, no midline pulsatile mass, no hepatomegaly, no splenomegaly EXT:  2 plus pulses throughout, no edema, no cyanosis no clubbing SKIN:  No rashes no nodules NEURO:  Cranial nerves II through XII grossly intact, motor grossly intact throughout PSYCH:  Cognitively intact, oriented to person place and time   ASSESSMENT/PLAN:    HTN - BP at goal in clinic but elevated SBP 150s at home. Encouraged to bring home cuff to next office visit as not previously checked for accuracy.  Continue present regimen hydralazine 25 mg twice daily, losartan 50 mg twice daily, metoprolol titrate 25 mg twice daily.  Previous intolerances detailed above.  May take additional dose of 25 mg hydralazine as needed for SBP greater than 150.  "Fogginess" / Dizziness -somewhat unclear history with 2 months of reported head fogginess, head heaviness, dizziness but not described as spinning nor needing to sit down.  No hypotensive blood pressure readings at home.  Sinuses not tender on palpation.  Does not describe his headache.  No noted aggravating or relieving factors.  No temporal  factors.  Plan for carotid duplex to rule out carotid stenosis are contributory but if unremarkable would recommend further follow-up with primary care provider.  HLD - Continue ATorvastatin 10mg  daily.   Screening for Secondary Hypertension:     01/20/2023    4:13 PM  Causes  Drugs/Herbals Screened     - Comments No tobacco,no EtOH, limits salt  Renovascular HTN  N/A  Sleep Apnea Screened     - Comments no snoring  Thyroid Disease Screened    Relevant Labs/Studies:    Latest Ref Rng & Units 05/21/2023    1:19 PM 02/13/2023    2:28 PM 02/10/2023    2:05 PM  Basic Labs  Sodium 134 - 144 mmol/L 133  129  128   Potassium 3.5 - 5.2 mmol/L 5.0  4.5  4.1   Creatinine 0.57 - 1.00 mg/dL 2.13  0.86  5.78        Latest Ref Rng & Units 02/05/2023    9:23 PM 09/02/2022    2:46 PM  Thyroid   TSH 0.350 - 4.500 uIU/mL 2.632  2.30        Latest Ref Rng & Units 02/02/2023    9:44 AM  Renin/Aldosterone   Aldosterone 0.0 - 30.0 ng/dL 46.9   Aldos/Renin Ratio 0.0 - 30.0 23.3        Latest Ref Rng & Units 02/02/2023    9:44 AM  Metanephrines/Catecholamines   Epinephrine 0 - 62 pg/mL 49   Norepinephrine 0 - 874 pg/mL 758   Dopamine 0 - 48 pg/mL <30   Metanephrines 0.0 - 88.0 pg/mL <25.0   Normetanephrines  0.0 - 297.2 pg/mL 52.3           09/26/2022   11:02 AM  Renovascular   Renal Artery [place] Completed Yes       Disposition:    FU with MD/PharmD in 2 months    Medication Adjustments/Labs and Tests Ordered: Current medicines are reviewed at length with the patient today.  Concerns regarding medicines are outlined above.  Orders Placed This Encounter  Procedures   VAS US CAROTID   No orders of the defined types were placed in this encounter.    Signed, Alver Sorrow, NP  06/04/2023 4:10 PM    [facility]

## 2023-06-12 ENCOUNTER — Ambulatory Visit (HOSPITAL_COMMUNITY)
Admission: RE | Admit: 2023-06-12 | Discharge: 2023-06-12 | Disposition: A | Discharge status: Home / Self Care | Payer: Medicare Other | Source: Ambulatory Visit | Attending: Cardiovascular Disease | Admitting: Cardiovascular Disease

## 2023-06-12 DIAGNOSIS — R42 Dizziness and giddiness: Secondary | ICD-10-CM | POA: Diagnosis present

## 2023-06-12 DIAGNOSIS — I10 Essential (primary) hypertension: Secondary | ICD-10-CM | POA: Diagnosis present

## 2023-07-17 ENCOUNTER — Telehealth: Payer: Self-pay | Admitting: Cardiovascular Disease

## 2023-07-17 ENCOUNTER — Telehealth: Payer: Self-pay | Admitting: Family Medicine

## 2023-07-17 MED ORDER — LOSARTAN POTASSIUM 50 MG PO TABS: 50.0000 mg | ORAL_TABLET | Freq: Two times a day (BID) | ORAL | 3 refills | Status: DC

## 2023-07-17 NOTE — Telephone Encounter (Signed)
Prescription Request  07/17/2023  LOV: 02/10/2023  What is the name of the medication or equipment?  losartan (COZAAR) 50 MG tablet   Have you contacted your pharmacy to request a refill? Yes   Which pharmacy would you like this sent to?  [id number] [place], [place] - [address] [place] [address] Phone: [phone] Fax: [phone]    Patient notified that their request is being sent to the clinical staff for review and that they should receive a response within 2 business days.   Please advise at Mobile [phone] (mobile)

## 2023-07-17 NOTE — Telephone Encounter (Signed)
Spoke with daughter and refilled as requested

## 2023-07-17 NOTE — Telephone Encounter (Signed)
*  STAT* If patient is at the pharmacy, call can be transferred to refill team.   1. Which medications need to be refilled? (please list name of each medication and dose if known)   losartan (COZAAR) 50 MG tablet    2. Which pharmacy/location (including street and city if local pharmacy) is medication to be sent to? [id number] [place], [place] - [address]    3. Do they need a 30 day or 90 day supply? 90 day

## 2023-07-17 NOTE — Telephone Encounter (Signed)
Pt is requesting a refill on losartan. Dr. Duke Salvia does not refill this medication. Pt's PCP refills. This medication. Would Dr. Duke Salvia like to refill this medication for the pt? Please address

## 2023-07-17 NOTE — Telephone Encounter (Signed)
Pt states she would like to speak with the nurse. Please advise

## 2023-07-21 NOTE — Telephone Encounter (Signed)
Last refill by Chilton Si, MD on 07/17/2023

## 2023-08-06 ENCOUNTER — Encounter (HOSPITAL_BASED_OUTPATIENT_CLINIC_OR_DEPARTMENT_OTHER): Payer: Self-pay | Admitting: Family

## 2023-08-06 ENCOUNTER — Ambulatory Visit (INDEPENDENT_AMBULATORY_CARE_PROVIDER_SITE_OTHER): Payer: Medicare Other | Admitting: Family

## 2023-08-06 VITALS — BP 130/62 | HR 62 | Ht <= 58 in | Wt 125.5 lb

## 2023-08-06 DIAGNOSIS — R0609 Other forms of dyspnea: Secondary | ICD-10-CM

## 2023-08-06 DIAGNOSIS — E782 Mixed hyperlipidemia: Secondary | ICD-10-CM

## 2023-08-06 DIAGNOSIS — I10 Essential (primary) hypertension: Secondary | ICD-10-CM

## 2023-08-06 NOTE — Patient Instructions (Signed)
Medication Instructions:  Your physician recommends that you continue on your current medications as directed. Please refer to the Current Medication list given to you today.  *If you need a refill on your cardiac medications before your next appointment, please call your pharmacy*  Lab Work: BMP, BNP, CBC  If you have labs (blood work) drawn today and your tests are completely normal, you will receive your results only by: MyChart Message (if you have MyChart) OR A paper copy in the mail If you have any lab test that is abnormal or we need to change your treatment, we will call you to review the results.  Follow-Up: At [facility], you and your health needs are our priority.  As part of our continuing mission to provide you with exceptional heart care, we have created designated Provider Care Teams.  These Care Teams include your primary Cardiologist (physician) and Advanced Practice Providers (APPs -  Physician Assistants and Nurse Practitioners) who all work together to provide you with the care you need, when you need it.  We recommend signing up for the patient portal called "MyChart".  Sign up information is provided on this After Visit Summary.  MyChart is used to connect with patients for Virtual Visits (Telemedicine).  Patients are able to view lab/test results, encounter notes, upcoming appointments, etc.  Non-urgent messages can be sent to your provider as well.   To learn more about what you can do with MyChart, go to [email or web address].    Your next appointment:   6 month(s) htn clinic  Provider:   Gillian Shields, NP

## 2023-08-06 NOTE — Progress Notes (Signed)
Advanced [facility]pertension [facility] Assessment:    Date:  08/06/2023   ID:  Lynn [name], DOB 20-Nov-1940, MRN [id number]  PCP:  Ardith Dark, MD  Cardiologist:  Donato Schultz, MD  Nephrologist:  Referring MD: Ardith Dark, MD   CC: Hypertension  History of Present Illness:    Lynn [name] is a 82 y.o. female with a hx of hypertension, prediabetes, moderate cognitive impairment, hyperlipidemia here to follow up in the [facility].   Established with [facility] 01/20/23 after referral from Dr. Anne Fu.  Initially on metoprolol, losartan.  HCTZ 12.5 mg later added and then later discontinued.  It was noted 1 point family members were giving her ACE and ARB because they felt it was better controlling her blood pressure than what the doctor had prescribed.  Anxiety has been contributory to labile BP.  ED visit 09/26/2022 for hyponatremia and lightheadedness with BP 139/53.  MRI negative for stroke.  Renal Dopplers 09/2022 normal flow bilaterally.  Initial advanced hypertension clinic visit 10/01/2022 BP uncontrolled and also orthostatic.  BP increased from 178/82 laying to 205/84 standing.  She had been taking higher doses of ARB's and recommended (losartan 100 mg twice daily).  Given she preferred 80s she was recommended to switch to lisinopril 40 mg daily.  Losartan and Catapres stopped.  She was given 25 mg hydralazine to take as needed for SBP greater than 150.  Spironolactone 12.5 mg daily was started.  Admitted 5/16/5/18/24 with hyponatremia NA 121 provided with IVF.  Metoprolol was held due to bradycardia.  Pharmacy visit 02/13/2023 BP in clinic 123/76 on his transition to hydralazine 25 mg twice daily.  Spironolactone have been discontinued due to hyperkalemia.  At pharmacy visit 02/26/2023 BP was mildly elevated.  Lisinopril 40 mg daily, metoprolol titrate 25 mg twice daily, try dose of 25 mg twice daily continue with additional 25 mg of  hydralazine for SBP greater than 150.  Last seen 06/04/23 with BP at goal  in clinic but elevated at home - she was asked to bring BP cuff to next visit. Regimen Hydralazine 25mg  BID, Losartan 50mg  BID, Metoprolol tartrate 25mg  BID. She noted "fogginess" and dizziness for 2 month with overall unclear history. Subsequent carotid duplex with no stenosis. Symptoms improved.  Presents today for follow-up. Visit assisted by her daughter, additional interpretor declined. Did not bring BP cuff to visit, but BPs at home 130s-140s. Has taken extra dose of hydralazine a couple of times per week but has not needed it the last two weeks. SOB with walking last couple of months, especially when going up stairs. No edema noted. Reports no chest pain, pressure, or tightness. No orthopnea, PND. Reports no palpitations.   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 No pertinent surgical history.  Current Medications: Current Meds  Medication Sig   aspirin EC 81 MG tablet Take 81 mg by mouth every other day. Swallow whole.   atorvastatin (LIPITOR) 10 MG tablet TAKE 1 TABLET (10 MG TOTAL) BY MOUTH DAILY AT 6 PM.   baclofen (LIORESAL) 10 MG tablet Take 0.5-1 tablets (5-10 mg total) by mouth 3 (three) times daily  as needed for muscle spasms.   calcium carbonate (OS-CAL - DOSED IN MG OF ELEMENTAL CALCIUM) 1250 (500 Ca) MG tablet Take 1 tablet by mouth 3 (three) times a week.   diclofenac Sodium (VOLTAREN) 1 % GEL APPLY 4 G TOPICALLY 4 (FOUR) TIMES DAILY AS NEEDED.   gabapentin (NEURONTIN) 100 MG capsule Take 1  capsule (100 mg total) by mouth 3 (three) times daily.   hydrALAZINE (APRESOLINE) 25 MG tablet TAKE 1 TABLET (25 MG) BY MOUTH IN THE MORNING AND AT BEDTIME   ibuprofen (ADVIL,MOTRIN) 200 MG tablet Take 200 mg by mouth every 6 (six) hours as needed.   losartan (COZAAR) 50 MG tablet Take 1 tablet (50 mg total) by mouth in the morning and at bedtime.   meclizine (ANTIVERT) 25 MG tablet Take 1 tablet (25 mg total) by mouth 3 (three) times daily as needed for dizziness.   metoprolol tartrate (LOPRESSOR) 25 MG tablet Take 1 tablet (25 mg total) by mouth 2 (two) times daily.   Multiple Vitamin (MULTIVITAMIN) tablet Take 1 tablet by mouth 2 (two) times a week.   predniSONE (DELTASONE) 20 MG tablet Take 2 pills in the morning with breakfast for 3 days, then 1 pill for 2 days   Pyridoxine HCl (VITAMIN B-6 PO) Take by mouth.   Thiamine HCl (VITAMIN B-1 PO) Take by mouth.     Allergies:   Norvasc [amlodipine] and Olmesartan   Social History   Socioeconomic History   Marital status: Widowed    Spouse name: Not on file   Number of children: 2   Years of education: some college   Highest education level: Not on file  Occupational History   Occupation: Retired  Tobacco Use   Smoking status: Never   Smokeless tobacco: Never  Vaping Use   Vaping status: Never Used  Substance and Sexual Activity   Alcohol use: No   Drug use: No   Sexual activity: Not Currently  Other Topics Concern   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; clerical work for office   Social Determinants of Corporate investment banker Strain: Low Risk  (03/24/2023)   Overall Financial Resource Strain (CARDIA)    Difficulty of Paying Living Expenses: Not hard at all  Food Insecurity: No Food Insecurity (03/24/2023)    Hunger Vital Sign    Worried About Running Out of Food in the Last Year: Never true    Ran Out of Food in the Last Year: Never true  Transportation Needs: No Transportation Needs (03/24/2023)   PRAPARE - Administrator, Civil Service (Medical): No    Lack of Transportation (Non-Medical): No  Physical Activity: Inactive (03/24/2023)   Exercise Vital Sign    Days of Exercise per Week: 0 days    Minutes of Exercise per Session: 0 min  Stress: No Stress Concern Present (03/24/2023)   [employer] of Occupational Health - Occupational Stress Questionnaire    Feeling of Stress : Not at all  Social Connections: Socially Isolated (03/24/2023)   Social Connection and Isolation Panel [NHANES]    Frequency of Communication with Friends and Family: Twice a week    Frequency of Social Gatherings with Friends and Family: Three times a week  Attends Religious Services: Never    Active Member of Clubs or Organizations: No    Attends Banker Meetings: Never    Marital Status: Widowed     Family History: The patient's family history includes Diabetes in her mother; Diverticulitis in her mother; Heart attack in her father and sister; Hypertension in her sister; Other in her father; Stroke in her sister.  ROS:   Please see the history of present illness.     All other systems reviewed and are negative.  EKGs/Labs/Other Studies Reviewed:         Recent Labs: 02/05/2023: ALT 18; TSH 2.632 02/06/2023: Hemoglobin 11.1; Platelets 245 05/21/2023: BUN 19; Creatinine, Ser 1.04; Potassium 5.0; Sodium 133   Recent Lipid Panel    Component Value Date/Time   CHOL 139 05/21/2023 1319   TRIG 97 05/21/2023 1319   HDL 60 05/21/2023 1319   CHOLHDL 2.3 05/21/2023 1319   CHOLHDL 2 07/18/2022 1230   VLDL 16.4 07/18/2022 1230   LDLCALC 61 05/21/2023 1319    Physical Exam:   VS:  BP 130/62   Pulse 62   Ht 4\' 9"  (1.448 m)   Wt 125 lb 8 oz (56.9 kg)   SpO2 96%   BMI 27.16 kg/m  ,  BMI Body mass index is 27.16 kg/m. GENERAL:  Well appearing HEENT: Pupils equal round and reactive, fundi not visualized, oral mucosa unremarkable NECK:  No jugular venous distention, waveform within normal limits, carotid upstroke brisk and symmetric, no bruits, no thyromegaly LYMPHATICS:  No cervical adenopathy LUNGS:  Clear to auscultation bilaterally HEART:  RRR.  PMI not displaced or sustained,S1 and S2 within normal limits, no S3, no S4, no clicks, no rubs, no murmurs ABD:  Flat, positive bowel sounds normal in frequency in pitch, no bruits, no rebound, no guarding, no midline pulsatile mass, no hepatomegaly, no splenomegaly EXT:  2 plus pulses throughout, no edema, no cyanosis no clubbing SKIN:  No rashes no nodules NEURO:  Cranial nerves II through XII grossly intact, motor grossly intact throughout PSYCH:  Cognitively intact, oriented to person place and time   ASSESSMENT/PLAN:    HTN - BP slightly more elevated at home per patient, but unfortunately did not bring cuff in to visit to assess accuracy. Controlled in clinic. Requested she bring BP cuff to next office visit. Will continue current medication regimen. Discussed to monitor BP at home at least 2 hours after medications and sitting for 5-10 minutes.  Exertional dyspnea- Lungs clear to auscultation. Likely due to deconditioning. Will check CBC, BNP, and BMP. Consider echocardiogram if BNP elevated.  "Fogginess" / Dizziness - Carotid duplex with no stenosis. No sinus tenderness. Symptoms have resolved, no further workup at this time. [name]  HLD - Continue Atorvastatin 10mg  daily.   Screening for Secondary Hypertension:     01/20/2023    4:13 PM  Causes  Drugs/Herbals Screened     - Comments No tobacco,no EtOH, limits salt  Renovascular HTN N/A  Sleep Apnea Screened     - Comments no snoring  Thyroid Disease Screened    Relevant Labs/Studies:    Latest Ref Rng & Units 05/21/2023    1:19 PM 02/13/2023    2:28 PM  02/10/2023    2:05 PM  Basic Labs  Sodium 134 - 144 mmol/L 133  129  128   Potassium 3.5 - 5.2 mmol/L 5.0  4.5  4.1   Creatinine 0.57 - 1.00 mg/dL 7.42  5.95  6.38  Latest Ref Rng & Units 02/05/2023    9:23 PM 09/02/2022    2:46 PM  Thyroid   TSH 0.350 - 4.500 uIU/mL 2.632  2.30        Latest Ref Rng & Units 02/02/2023    9:44 AM  Renin/Aldosterone   Aldosterone 0.0 - 30.0 ng/dL 16.1   Aldos/Renin Ratio 0.0 - 30.0 23.3        Latest Ref Rng & Units 02/02/2023    9:44 AM  Metanephrines/Catecholamines   Epinephrine 0 - 62 pg/mL 49   Norepinephrine 0 - 874 pg/mL 758   Dopamine 0 - 48 pg/mL <30   Metanephrines 0.0 - 88.0 pg/mL <25.0   Normetanephrines  0.0 - 297.2 pg/mL 52.3           09/26/2022   11:02 AM  Renovascular   Renal Artery [place] Completed Yes       Disposition:    FU with MD/PharmD/APP in 6 months.   Medication Adjustments/Labs and Tests Ordered: Current medicines are reviewed at length with the patient today.  Concerns regarding medicines are outlined above.  Orders Placed This Encounter  Procedures   Basic metabolic panel   Brain natriuretic peptide   CBC   No orders of the defined types were placed in this encounter.    Signed, Alver Sorrow, NP  08/06/2023 2:20 PM    [facility]

## 2023-08-07 LAB — CBC
Hematocrit: 38.3 % (ref 34.0–46.6)
Hemoglobin: 12.4 g/dL (ref 11.1–15.9)
MCH: 29.1 pg (ref 26.6–33.0)
MCHC: 32.4 g/dL (ref 31.5–35.7)
MCV: 90 fL (ref 79–97)
Platelets: 332 10*3/uL (ref 150–450)
RBC: 4.26 x10E6/uL (ref 3.77–5.28)
RDW: 12.5 % (ref 11.7–15.4)
WBC: 8.7 10*3/uL (ref 3.4–10.8)

## 2023-08-07 LAB — BASIC METABOLIC PANEL
BUN/Creatinine Ratio: 22 (ref 12–28)
BUN: 21 mg/dL (ref 8–27)
CO2: 22 mmol/L (ref 20–29)
Calcium: 9.3 mg/dL (ref 8.7–10.3)
Chloride: 98 mmol/L (ref 96–106)
Creatinine, Ser: 0.97 mg/dL (ref 0.57–1.00)
Glucose: 157 mg/dL — ABNORMAL HIGH (ref 70–99)
Potassium: 4.6 mmol/L (ref 3.5–5.2)
Sodium: 138 mmol/L (ref 134–144)
eGFR: 58 mL/min/{1.73_m2} — ABNORMAL LOW (ref >59)

## 2023-08-07 LAB — BRAIN NATRIURETIC PEPTIDE: BNP: 99.3 pg/mL (ref 0.0–100.0)

## 2023-08-18 ENCOUNTER — Encounter: Payer: Self-pay | Admitting: Physical Medicine and Rehabilitation

## 2023-08-18 ENCOUNTER — Ambulatory Visit: Payer: Medicare Other | Admitting: Physical Medicine and Rehabilitation

## 2023-08-18 DIAGNOSIS — M5416 Radiculopathy, lumbar region: Secondary | ICD-10-CM | POA: Diagnosis not present

## 2023-08-18 DIAGNOSIS — M5441 Lumbago with sciatica, right side: Secondary | ICD-10-CM

## 2023-08-18 DIAGNOSIS — M5442 Lumbago with sciatica, left side: Secondary | ICD-10-CM | POA: Diagnosis not present

## 2023-08-18 DIAGNOSIS — M48061 Spinal stenosis, lumbar region without neurogenic claudication: Secondary | ICD-10-CM | POA: Diagnosis not present

## 2023-08-18 DIAGNOSIS — G8929 Other chronic pain: Secondary | ICD-10-CM

## 2023-08-18 MED ORDER — GABAPENTIN 100 MG PO CAPS: 100.0000 mg | ORAL_CAPSULE | Freq: Every day | ORAL | 0 refills | Status: DC

## 2023-08-18 NOTE — Progress Notes (Signed)
She doesn't want another injection she would like something else to manage the pain and would like a back brace.

## 2023-08-18 NOTE — Progress Notes (Signed)
Lynn [name] - 82 y.o. female MRN [id number]  Date of birth: Feb 05, 1941  Office Visit Note: Visit Date: 08/18/2023 PCP: Ardith Dark, MD Referred by: Ardith Dark, MD  Subjective: Chief Complaint  Patient presents with   Lower Back - Pain   HPI: Lynn [name] is a 82 y.o. female who comes in today for evaluation of chronic, worsening and severe bilateral lower back pain radiating to buttocks, hips and down anterior thighs to knees.  Patient speaks [place], daughter at bedside to translate. Pain ongoing for several years. Her pain worsens with standing and walking. She describes her pain as sore and aching, currently rates as 8 out of 10. Some relief of pain with home exercise regimen, rest and use of medications.  Lumbar MRI imaging from 2021 exhibits multilevel disc degeneration, most severe at L4-L5 where there is moderate to severe bilateral foraminal stenosis that could cause bilateral L4 nerve root impingement. There is a small central disc protrusion at L5-S1. No high grade spinal canal stenosis noted. Patient underwent bilateral L4 transforaminal epidural steroid injection in our office on 01/05/2023, she reports greater than 80% relief of pain with this procedure for over 3 months. Daughter is requesting new lumbar brace for her mother. Patient denies focal weakness, numbness and tingling. No recent trauma or falls.      Review of Systems  Musculoskeletal:  Positive for back pain.  Neurological:  Negative for tingling, sensory change, focal weakness and weakness.  All other systems reviewed and are negative.  Otherwise per HPI.  Assessment & Plan: Visit Diagnoses:    ICD-10-CM   1. Chronic bilateral low back pain with bilateral sciatica  M54.42    M54.41    G89.29     2. Lumbar radiculopathy  M54.16     3. Foraminal stenosis of lumbar region  M48.061        Plan: Findings:  Chronic, worsening and severe bilateral lower back pain radiating to  buttocks, hips and down anterior thighs to knees. Patient continues to have severe pain despite good conservative therapies such as home exercise regimen, rest and use of medications.  Patient's clinical presentation and exam are consistent with L4 nerve pattern. There is moderate to severe bilateral foraminal stenosis at L4-L5 with possible L4 nerve root impingement.  Patient does not wish to undergo further lumbar injections at this time. She is requesting pain medication and new lumbar brace.  There was some type of mixup with her daughter from our last visit, patient did not start Gabapentin.  I have represcribed this medication today and instructed her to take 100 mg at bedtime.  I will also get in touch with Rayfield Citizen from [place] to arrange for patient to come in the office to be fitted for lumbar brace.  I ensured that patient and daughter knows there will be a charge for this brace, Rayfield Citizen should be able to provide patient with more information regarding out-of-pocket cost.  Should her pain persist or worsen I would recommend repeating bilateral L4 transforaminal epidural steroid injection.  At some point, I would recommend obtaining new lumbar MRI especially if treatments are not helping to alleviate her pain. No red flag symptoms noted upon exam today.     Meds & Orders:  Meds ordered this encounter  Medications   gabapentin (NEURONTIN) 100 MG capsule    Sig: Take 1 capsule (100 mg total) by mouth at bedtime.    Dispense:  30 capsule    Refill:  0  No orders of the defined types were placed in this encounter.   Follow-up: Return if symptoms worsen or fail to improve.   Procedures: No procedures performed      Clinical History: EXAM: MRI LUMBAR SPINE WITHOUT CONTRAST   TECHNIQUE: Multiplanar, multisequence MR imaging of the lumbar spine was performed. No intravenous contrast was administered.   COMPARISON:  Lumbar spine radiographs 01/27/2020   FINDINGS: Segmentation: The  lowest fully formed intervertebral disc space is designated L5-S1. L5 is mildly transitional.   Alignment:  Trace retrolisthesis of L1 on L2 and L2 on L3.   Vertebrae: No fracture or suspicious osseous lesion. Scattered small Schmorl's nodes. Degenerative endplate changes throughout the lumbar spine including mild edema.   Conus medullaris and cauda equina: Conus extends to the L1 level. Conus and cauda equina appear normal.   Paraspinal and other soft tissues: Unremarkable.   Disc levels:   Disc desiccation throughout the lumbar spine. Severe disc space narrowing at L1-2 and L4-5 and mild narrowing at L2-3 and L3-4.   T11-12: Minimal disc bulging without stenosis.   T12-L1: Mild disc bulging without stenosis.   L1-2: Left eccentric disc bulging results in mild left neural foraminal stenosis without spinal stenosis.   L2-3: Disc bulging and a small superimposed left foraminal disc protrusion result in mild bilateral lateral recess stenosis and mild left neural foraminal stenosis without significant spinal stenosis.   L3-4: Disc bulging slightly eccentric to the right results in borderline lateral recess stenosis and mild right neural foraminal stenosis without spinal stenosis.   L4-5: Disc bulging, endplate spurring, severe disc space height loss, and mild facet and ligamentum flavum hypertrophy result in moderate to severe bilateral neural foraminal stenosis with potential bilateral L4 nerve root impingement. No spinal stenosis.   L5-S1: Right eccentric disc bulging and mild right facet hypertrophy result in mild-to-moderate right neural foraminal stenosis. There is a small central disc protrusion without spinal stenosis.   IMPRESSION: 1. 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 2. Mild-to-moderate right neural foraminal stenosis at L5-S1. 3. No significant spinal stenosis.      Electronically Signed   By: Sebastian Ache M.D.   On: 07/08/2020 15:20   She reports that she has never smoked. She has never used smokeless tobacco.  Recent Labs    05/21/23 1319  HGBA1C 5.8*    Objective:  VS:  HT:    WT:   BMI:     BP:   HR: bpm  TEMP: ( )  RESP:  Physical Exam Vitals and nursing note reviewed.  HENT:     Head: Normocephalic and atraumatic.     Right Ear: External ear normal.     Left Ear: External ear normal.     Nose: Nose normal.     Mouth/Throat:     Mouth: Mucous membranes are moist.  Eyes:     Extraocular Movements: Extraocular movements intact.  Cardiovascular:     Rate and Rhythm: Normal rate.     Pulses: Normal pulses.  Pulmonary:     Effort: Pulmonary effort is normal.  Abdominal:     General: Abdomen is flat. There is no distension.  Musculoskeletal:        General: Tenderness present.     Cervical back: Normal range of motion.     Comments: Patient is slow to rise from seated position to standing. Good lumbar range of motion. No pain noted with facet loading.  5/5 strength noted with bilateral hip flexion, knee flexion/extension, ankle dorsiflexion/plantarflexion and EHL. No clonus noted bilaterally. No pain upon palpation of greater trochanters. No pain with internal/external rotation of bilateral hips. Sensation intact bilaterally. Dysesthesias noted to bilateral L4 dermatomes. Ambulates without aid, gait steady.       Skin:    General: Skin is warm and dry.     Capillary Refill: Capillary refill takes less than 2 seconds.  Neurological:     General: No focal deficit present.     Mental Status: She is alert and oriented to person, place, and time.  Psychiatric:        Mood and Affect: Mood normal.        Behavior: Behavior normal.     Ortho Exam  Imaging: No results found.  Past Medical/Family/Surgical/Social History: Medications & Allergies reviewed per EMR, new medications updated. 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 No pertinent surgical history. Social History   Occupational History   Occupation: Retired  Tobacco Use   Smoking status: Never   Smokeless tobacco: Never  Vaping Use   Vaping status: Never Used  Substance and Sexual Activity   Alcohol use: No   Drug use: No   Sexual activity: Not Currently

## 2023-09-01 ENCOUNTER — Ambulatory Visit: Payer: Medicare Other | Admitting: Physical Medicine and Rehabilitation

## 2023-09-03 ENCOUNTER — Encounter: Payer: Self-pay | Admitting: Physical Medicine and Rehabilitation

## 2023-09-03 ENCOUNTER — Ambulatory Visit: Payer: Medicare Other | Admitting: Physical Medicine and Rehabilitation

## 2023-09-03 DIAGNOSIS — G8929 Other chronic pain: Secondary | ICD-10-CM | POA: Diagnosis not present

## 2023-09-03 DIAGNOSIS — M5442 Lumbago with sciatica, left side: Secondary | ICD-10-CM

## 2023-09-03 DIAGNOSIS — M48061 Spinal stenosis, lumbar region without neurogenic claudication: Secondary | ICD-10-CM | POA: Diagnosis not present

## 2023-09-03 DIAGNOSIS — M5416 Radiculopathy, lumbar region: Secondary | ICD-10-CM | POA: Diagnosis not present

## 2023-09-03 DIAGNOSIS — M5441 Lumbago with sciatica, right side: Secondary | ICD-10-CM | POA: Diagnosis not present

## 2023-09-03 NOTE — Progress Notes (Signed)
Lynn [name] - 82 y.o. female MRN [id number]  Date of birth: April 11, 1941  Office Visit Note: Visit Date: 09/03/2023 PCP: Ardith Dark, MD Referred by: Ardith Dark, MD  Subjective: Chief Complaint  Patient presents with   Lower Back - Pain   HPI: Lynn [name] is a 82 y.o. female who comes in today for evaluation of chronic, worsening and severe bilateral lower back pain radiating to buttocks, hips and down anterior thighs to knees. Patient speaks [place], daughter at bedside to translate. Patient came into office today to get fitted for lumbar brace. Unfortunately, patient did not arrange ahead and time and Rayfield Citizen from [facility] could not make it to office today.  Pain ongoing for several years. Her pain worsens with standing and walking. She describes her pain as sore and aching, currently rates as 8 out of 10. Some relief of pain with home exercise regimen, rest and use of medications.  Lumbar MRI imaging from 2021 exhibits multilevel disc degeneration, most severe at L4-L5 where there is moderate to severe bilateral foraminal stenosis that could cause bilateral L4 nerve root impingement. There is a small central disc protrusion at L5-S1. No high grade spinal canal stenosis noted. Patient denies focal weakness, numbness and tingling. No recent trauma or falls.      Review of Systems  Musculoskeletal:  Positive for back pain.  Neurological:  Negative for tingling, sensory change, focal weakness and weakness.   Otherwise per HPI.  Assessment & Plan: Visit Diagnoses:    ICD-10-CM   1. Chronic bilateral low back pain with bilateral sciatica  M54.42    M54.41    G89.29     2. Lumbar radiculopathy  M54.16     3. Foraminal stenosis of lumbar region  M48.061        Plan: Findings:  Chronic, worsening and severe bilateral lower back pain radiating to buttocks, hips and down anterior thighs to knees. Patient continues to have severe pain despite good  conservative therapy such as home exercise regimen, rest and use of medications. Patient's clinical presentation and exam are consistent with L4 nerve pattern. There is moderate to severe bilateral foraminal stenosis at L4-L5 with possible L4 nerve root impingement.  Patient does not wish to undergo any type of interventional spine procedures at this time.  I spoke with Rayfield Citizen from [facility] this afternoon, we attempted to fit patient with a lumbar brace that we have in stock, however patient was requesting a different lumbar brace that we do not have here in the office.  I provided patient's daughter with Caroline's phone number so that she can arrange for a meeting to discuss different lumbar brace options.  Patient and daughter have no further questions at this time, daughter will call us with any concerns.  No red flag symptoms noted upon exam today.    Meds & Orders: No orders of the defined types were placed in this encounter.  No orders of the defined types were placed in this encounter.   Follow-up: Return if symptoms worsen or fail to improve.   Procedures: No procedures performed      Clinical History: EXAM: MRI LUMBAR SPINE WITHOUT CONTRAST   TECHNIQUE: Multiplanar, multisequence MR imaging of the lumbar spine was performed. No intravenous contrast was administered.   COMPARISON:  Lumbar spine radiographs 01/27/2020   FINDINGS: Segmentation: The lowest fully formed intervertebral disc space is designated L5-S1. L5 is mildly transitional.   Alignment:  Trace retrolisthesis of L1 on L2  and L2 on L3.   Vertebrae: No fracture or suspicious osseous lesion. Scattered small Schmorl's nodes. Degenerative endplate changes throughout the lumbar spine including mild edema.   Conus medullaris and cauda equina: Conus extends to the L1 level. Conus and cauda equina appear normal.   Paraspinal and other soft tissues: Unremarkable.   Disc levels:   Disc desiccation throughout the  lumbar spine. Severe disc space narrowing at L1-2 and L4-5 and mild narrowing at L2-3 and L3-4.   T11-12: Minimal disc bulging without stenosis.   T12-L1: Mild disc bulging without stenosis.   L1-2: Left eccentric disc bulging results in mild left neural foraminal stenosis without spinal stenosis.   L2-3: Disc bulging and a small superimposed left foraminal disc protrusion result in mild bilateral lateral recess stenosis and mild left neural foraminal stenosis without significant spinal stenosis.   L3-4: Disc bulging slightly eccentric to the right results in borderline lateral recess stenosis and mild right neural foraminal stenosis without spinal stenosis.   L4-5: Disc bulging, endplate spurring, severe disc space height loss, and mild facet and ligamentum flavum hypertrophy result in moderate to severe bilateral neural foraminal stenosis with potential bilateral L4 nerve root impingement. No spinal stenosis.   L5-S1: Right eccentric disc bulging and mild right facet hypertrophy result in mild-to-moderate right neural foraminal stenosis. There is a small central disc protrusion without spinal stenosis.   IMPRESSION: 1. 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 2. Mild-to-moderate right neural foraminal stenosis at L5-S1. 3. No significant spinal stenosis.     Electronically Signed   By: Sebastian Ache M.D.   On: 07/08/2020 15:20   She reports that she has never smoked. She has never used smokeless tobacco.  Recent Labs    05/21/23 1319  HGBA1C 5.8*    Objective:  VS:  HT:    WT:   BMI:     BP:   HR: bpm  TEMP: ( )  RESP:  Physical Exam Vitals and nursing note reviewed.  HENT:     Head: Normocephalic and atraumatic.     Right Ear: External ear normal.     Left Ear: External ear normal.     Nose: Nose normal.     Mouth/Throat:     Mouth: Mucous membranes are moist.  Eyes:      Extraocular Movements: Extraocular movements intact.  Cardiovascular:     Rate and Rhythm: Normal rate.     Pulses: Normal pulses.  Pulmonary:     Effort: Pulmonary effort is normal.  Abdominal:     General: Abdomen is flat. There is no distension.  Musculoskeletal:        General: Tenderness present.     Cervical back: Normal range of motion.     Comments: Patient is slow to rise from seated position to standing. Good lumbar range of motion. No pain noted with facet loading. 5/5 strength noted with bilateral hip flexion, knee flexion/extension, ankle dorsiflexion/plantarflexion and EHL. No clonus noted bilaterally. No pain upon palpation of greater trochanters. No pain with internal/external rotation of bilateral hips. Sensation intact bilaterally. Dysesthesias noted to bilateral L4 dermatomes. Ambulates without aid, gait steady.       Skin:    General: Skin is warm and dry.     Capillary Refill: Capillary refill takes less than 2 seconds.  Neurological:     General: No focal deficit present.     Mental Status: She is alert  and oriented to person, place, and time.  Psychiatric:        Mood and Affect: Mood normal.        Behavior: Behavior normal.     Ortho Exam  Imaging: No results found.  Past Medical/Family/Surgical/Social History: Medications & Allergies reviewed per EMR, new medications updated. 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 Social History   Occupational History   Occupation: Retired  Tobacco Use   Smoking status: Never   Smokeless tobacco: Never  Vaping Use   Vaping status: Never Used  Substance and Sexual Activity   Alcohol use: No   Drug use: No   Sexual activity: Not Currently

## 2023-09-24 ENCOUNTER — Telehealth: Payer: Self-pay | Admitting: Physical Medicine and Rehabilitation

## 2023-09-24 ENCOUNTER — Other Ambulatory Visit: Payer: Self-pay | Admitting: Physical Medicine and Rehabilitation

## 2023-09-24 DIAGNOSIS — M5416 Radiculopathy, lumbar region: Secondary | ICD-10-CM

## 2023-09-24 NOTE — Telephone Encounter (Signed)
 Pt lost number for person who is supposed to give her a brace for her back please advise

## 2023-10-15 ENCOUNTER — Ambulatory Visit: Payer: Medicare Other | Admitting: Physical Medicine and Rehabilitation

## 2023-10-15 ENCOUNTER — Other Ambulatory Visit: Payer: Self-pay

## 2023-10-15 DIAGNOSIS — M5416 Radiculopathy, lumbar region: Secondary | ICD-10-CM

## 2023-10-15 MED ORDER — GABAPENTIN 100 MG PO CAPS: 100.0000 mg | ORAL_CAPSULE | Freq: Every day | ORAL | 3 refills | Status: DC

## 2023-10-15 MED ORDER — METHYLPREDNISOLONE ACETATE 40 MG/ML IJ SUSP
40.0000 mg | Freq: Once | INTRAMUSCULAR | Status: AC
  Administered 2023-10-15: 40 mg

## 2023-10-15 NOTE — Progress Notes (Signed)
Lynn [name] - 83 y.o. female MRN [id number]  Date of birth: November 19, 1940  Office Visit Note: Visit Date: 10/15/2023 PCP: Ardith Dark, MD Referred by: Ardith Dark, MD  Subjective: Chief Complaint  Patient presents with   Lower Back - Pain   HPI:  Lynn [name] is a 83 y.o. female who comes in today for planned repeat Bilateral L4-5  Lumbar Transforaminal epidural steroid injection with fluoroscopic guidance.  The patient has failed conservative care including home exercise, medications, time and activity modification.  This injection will be diagnostic and hopefully therapeutic.  Please see requesting physician notes for further details and justification. Patient received more than 50% pain relief from prior injection.   Referring: Ellin Goodie, FNP  I did refill the 100 mg of gabapentin taken at night.  This was started by Ellin Goodie, FNP.  Depending on how that goes we can always slowly increase that amount over time.  Secondary problem today is left knee pain with obvious varus deformity on brief examination.  Reviewing the chart she had seen Dr. Norlene Campbell in the past before he retired and they had completed intra-articular injections.  She continues to use Voltaren gel on the knee.  The daughters can tell me in a couple of weeks how the knee is doing if she still having issues we will probably have her follow-up with [place] Persons, PA-C or Dr. Madelyn Brunner.   ROS Otherwise per HPI.  Assessment & Plan: Visit Diagnoses:    ICD-10-CM   1. Lumbar radiculopathy  M54.16 XR C-ARM NO REPORT    Epidural Steroid injection    methylPREDNISolone acetate (DEPO-MEDROL) injection 40 mg      Plan: No additional findings.   Meds & Orders:  Meds ordered this encounter  Medications   methylPREDNISolone acetate (DEPO-MEDROL) injection 40 mg   gabapentin (NEURONTIN) 100 MG capsule    Sig: Take 1 capsule (100 mg total) by mouth at bedtime.     Dispense:  30 capsule    Refill:  3    Orders Placed This Encounter  Procedures   XR C-ARM NO REPORT   Epidural Steroid injection    Follow-up: Return if symptoms worsen or fail to improve.   Procedures: No procedures performed  Lumbosacral Transforaminal Epidural Steroid Injection - Sub-Pedicular Approach with Fluoroscopic Guidance  Patient: Lynn [name]      Date of Birth: [birth year] MRN: [id number] PCP: Ardith Dark, MD      Visit Date: 10/15/2023   Universal Protocol:    Date/Time: 10/15/2023  Consent Given By: the patient  Position: PRONE  Additional Comments: Vital signs were monitored before and after the procedure. Patient was prepped and draped in the usual sterile fashion. The correct patient, procedure, and site was verified.   Injection Procedure Details:   Procedure diagnoses: Lumbar radiculopathy [M54.16]    Meds Administered:  Meds ordered this encounter  Medications   methylPREDNISolone acetate (DEPO-MEDROL) injection 40 mg   gabapentin (NEURONTIN) 100 MG capsule    Sig: Take 1 capsule (100 mg total) by mouth at bedtime.    Dispense:  30 capsule    Refill:  3    Laterality: Bilateral  Location/Site: L4  Needle:5.0 in., 22 ga.  Short bevel or Quincke spinal needle  Needle Placement: Transforaminal  Findings:    -Comments: Excellent flow of contrast along the nerve, nerve root and into the epidural space.  She is very shallow on the injury point into the foramen on both  sides.  She needs more obliquity on the trajectory view.  Procedure Details: After squaring off the end-plates to get a true AP view, the C-arm was positioned so that an oblique view of the foramen as noted above was visualized. The target area is just inferior to the "nose of the scotty dog" or sub pedicular. The soft tissues overlying this structure were infiltrated with 2-3 ml. of 1% Lidocaine without Epinephrine.  The spinal needle was inserted toward the  target using a "trajectory" view along the fluoroscope beam.  Under AP and lateral visualization, the needle was advanced so it did not puncture dura and was located close the 6 O'Clock position of the pedical in AP tracterory. Biplanar projections were used to confirm position. Aspiration was confirmed to be negative for CSF and/or blood. A 1-2 ml. volume of Isovue-250 was injected and flow of contrast was noted at each level. Radiographs were obtained for documentation purposes.   After attaining the desired flow of contrast documented above, a 0.5 to 1.0 ml test dose of 0.25% Marcaine was injected into each respective transforaminal space.  The patient was observed for 90 seconds post injection.  After no sensory deficits were reported, and normal lower extremity motor function was noted,   the above injectate was administered so that equal amounts of the injectate were placed at each foramen (level) into the transforaminal epidural space.   Additional Comments:  No complications occurred Dressing: 2 x 2 sterile gauze and Band-Aid    Post-procedure details: Patient was observed during the procedure. Post-procedure instructions were reviewed.  Patient left the clinic in stable condition.    Clinical History: EXAM: MRI LUMBAR SPINE WITHOUT CONTRAST   TECHNIQUE: Multiplanar, multisequence MR imaging of the lumbar spine was performed. No intravenous contrast was administered.   COMPARISON:  Lumbar spine radiographs 01/27/2020   FINDINGS: Segmentation: The lowest fully formed intervertebral disc space is designated L5-S1. L5 is mildly transitional.   Alignment:  Trace retrolisthesis of L1 on L2 and L2 on L3.   Vertebrae: No fracture or suspicious osseous lesion. Scattered small Schmorl's nodes. Degenerative endplate changes throughout the lumbar spine including mild edema.   Conus medullaris and cauda equina: Conus extends to the L1 level. Conus and cauda equina appear normal.    Paraspinal and other soft tissues: Unremarkable.   Disc levels:   Disc desiccation throughout the lumbar spine. Severe disc space narrowing at L1-2 and L4-5 and mild narrowing at L2-3 and L3-4.   T11-12: Minimal disc bulging without stenosis.   T12-L1: Mild disc bulging without stenosis.   L1-2: Left eccentric disc bulging results in mild left neural foraminal stenosis without spinal stenosis.   L2-3: Disc bulging and a small superimposed left foraminal disc protrusion result in mild bilateral lateral recess stenosis and mild left neural foraminal stenosis without significant spinal stenosis.   L3-4: Disc bulging slightly eccentric to the right results in borderline lateral recess stenosis and mild right neural foraminal stenosis without spinal stenosis.   L4-5: Disc bulging, endplate spurring, severe disc space height loss, and mild facet and ligamentum flavum hypertrophy result in moderate to severe bilateral neural foraminal stenosis with potential bilateral L4 nerve root impingement. No spinal stenosis.   L5-S1: Right eccentric disc bulging and mild right facet hypertrophy result in mild-to-moderate right neural foraminal stenosis. There is a small central disc protrusion without spinal stenosis.   IMPRESSION: 1. Widespread lumbar disc degeneration, worst at L4-5 where there is moderate to severe bilateral neural foraminal  stenosis. 2. Mild-to-moderate right neural foraminal stenosis at L5-S1. 3. No significant spinal stenosis.     Electronically Signed   By: Sebastian Ache M.D.   On: 07/08/2020 15:20     Objective:  VS:  HT:    WT:   BMI:     BP:   HR: bpm  TEMP: ( )  RESP:  Physical Exam Vitals and nursing note reviewed.  Constitutional:      General: She is not in acute distress.    Appearance: Normal appearance. She is not ill-appearing.  HENT:     Head: Normocephalic and atraumatic.     Right Ear: External ear normal.     Left Ear: External ear  normal.  Eyes:     Extraocular Movements: Extraocular movements intact.  Cardiovascular:     Rate and Rhythm: Normal rate.     Pulses: Normal pulses.  Pulmonary:     Effort: Pulmonary effort is normal. No respiratory distress.  Abdominal:     General: There is no distension.     Palpations: Abdomen is soft.  Musculoskeletal:        General: Tenderness and deformity present.     Cervical back: Neck supple.     Right lower leg: No edema.     Left lower leg: No edema.     Comments: Patient has good distal strength with no pain over the greater trochanters.  No clonus or focal weakness.  Left knee varus deformity.  Skin:    Findings: No erythema, lesion or rash.  Neurological:     General: No focal deficit present.     Mental Status: She is alert and oriented to person, place, and time.     Sensory: No sensory deficit.     Motor: No weakness or abnormal muscle tone.     Coordination: Coordination normal.  Psychiatric:        Mood and Affect: Mood normal.        Behavior: Behavior normal.      Imaging: No results found.

## 2023-10-15 NOTE — Patient Instructions (Signed)

## 2023-10-15 NOTE — Procedures (Signed)
Lumbosacral Transforaminal Epidural Steroid Injection - Sub-Pedicular Approach with Fluoroscopic Guidance  Patient: Lynn [name]      Date of Birth: [birth year] MRN: [id number] PCP: Ardith Dark, MD      Visit Date: 10/15/2023   Universal Protocol:    Date/Time: 10/15/2023  Consent Given By: the patient  Position: PRONE  Additional Comments: Vital signs were monitored before and after the procedure. Patient was prepped and draped in the usual sterile fashion. The correct patient, procedure, and site was verified.   Injection Procedure Details:   Procedure diagnoses: Lumbar radiculopathy [M54.16]    Meds Administered:  Meds ordered this encounter  Medications   methylPREDNISolone acetate (DEPO-MEDROL) injection 40 mg   gabapentin (NEURONTIN) 100 MG capsule    Sig: Take 1 capsule (100 mg total) by mouth at bedtime.    Dispense:  30 capsule    Refill:  3    Laterality: Bilateral  Location/Site: L4  Needle:5.0 in., 22 ga.  Short bevel or Quincke spinal needle  Needle Placement: Transforaminal  Findings:    -Comments: Excellent flow of contrast along the nerve, nerve root and into the epidural space.  She is very shallow on the injury point into the foramen on both sides.  She needs more obliquity on the trajectory view.  Procedure Details: After squaring off the end-plates to get a true AP view, the C-arm was positioned so that an oblique view of the foramen as noted above was visualized. The target area is just inferior to the "nose of the scotty dog" or sub pedicular. The soft tissues overlying this structure were infiltrated with 2-3 ml. of 1% Lidocaine without Epinephrine.  The spinal needle was inserted toward the target using a "trajectory" view along the fluoroscope beam.  Under AP and lateral visualization, the needle was advanced so it did not puncture dura and was located close the 6 O'Clock position of the pedical in AP tracterory. Biplanar  projections were used to confirm position. Aspiration was confirmed to be negative for CSF and/or blood. A 1-2 ml. volume of Isovue-250 was injected and flow of contrast was noted at each level. Radiographs were obtained for documentation purposes.   After attaining the desired flow of contrast documented above, a 0.5 to 1.0 ml test dose of 0.25% Marcaine was injected into each respective transforaminal space.  The patient was observed for 90 seconds post injection.  After no sensory deficits were reported, and normal lower extremity motor function was noted,   the above injectate was administered so that equal amounts of the injectate were placed at each foramen (level) into the transforaminal epidural space.   Additional Comments:  No complications occurred Dressing: 2 x 2 sterile gauze and Band-Aid    Post-procedure details: Patient was observed during the procedure. Post-procedure instructions were reviewed.  Patient left the clinic in stable condition.

## 2023-11-02 ENCOUNTER — Telehealth: Payer: Self-pay | Admitting: Cardiology

## 2023-11-02 ENCOUNTER — Encounter (HOSPITAL_BASED_OUTPATIENT_CLINIC_OR_DEPARTMENT_OTHER): Payer: Self-pay | Admitting: Cardiovascular Disease

## 2023-11-02 ENCOUNTER — Ambulatory Visit: Payer: Self-pay | Admitting: Family Medicine

## 2023-11-02 NOTE — Telephone Encounter (Signed)
 Pt c/o Shortness Of Breath: STAT if SOB developed within the last 24 hours or pt is noticeably SOB on the phone  1. Are you currently SOB (can you hear that pt is SOB on the phone)? no  2. How long have you been experiencing SOB?  2 months  3. Are you SOB when sitting or when up moving around?  Comes and goes especially when she is walking  4. Are you currently experiencing any other symptoms? no  Patient's daughter states her mother had seen a provider and was told she is okay, but states the patient says she is not okay especially when she is walking. She says the patient is not currently SOB.

## 2023-11-02 NOTE — Telephone Encounter (Signed)
 Spoke with daughter Ilsa Maltese - OK per DPR regarding pt's c/o shortness of breath.  She reports pt complains of shortness of breath only sometimes with walking in the house.  She does not general go outside. She has had these s/s only on occasion - not all the time.  This is new for her and has never had shortness of breath.  She is scheduled with PCP 11/03/23.  Advised Dr Daneil Dunker can evaluate her and make us  aware if cardiology needs to see her.  Daughter states understanding and will send a message through MyChart if needed.

## 2023-11-02 NOTE — Telephone Encounter (Signed)
 noted

## 2023-11-02 NOTE — Telephone Encounter (Signed)
 Copied from CRM [id number]. Topic: Clinical - Red Word Triage >> Nov 02, 2023  8:45 AM Juvenal Opoka wrote: Red Word that prompted transfer to Nurse Triage: Patient representative on line regarding scheduling patient for swollen feet ongoing since last week , states patient would like a full blood panel scheduled  Agent stating daughter hung up before transfer to nurse, per pt chart, daughter called back and scheduled appt for pt. Nurse called daughter back to ensure triage for symptoms. Pt daughter stating "already has an appt tomorrow," advised nurse ask a few questions to ensure proper care. Daughter confirms swelling also to ankles, nurse attempting to ask other questions, daughter stating "cannot explain, will talk to doctor tomorrow." Daughter refusing offer of triage assistance.  Reason for Disposition  [1] Caller demands to speak with the PCP AND [2] about sick adult (or sick caller)    Requesting to just speak to doc at appt tomorrow, refusing triager assistance.  Answer Assessment - Initial Assessment Questions 1. LOCATION: "Which ankle is swollen?" "Where is the swelling?"     Feet and ankles Pt daughter stating "already has an appt tomorrow," advised nurse ask a few questions to ensure proper care. Daughter confirms swelling also to ankles, nurse attempting to ask other questions, daughter stating "cannot explain, will talk to doctor tomorrow." Daughter refusing offer of triage assistance.  Answer Assessment - Initial Assessment Questions Pt daughter stating "already has an appt tomorrow," advised nurse ask a few questions to ensure proper care. Daughter confirms swelling also to ankles, nurse attempting to ask other questions, daughter stating "cannot explain, will talk to doctor tomorrow." Daughter refusing offer of triage assistance.  Protocols used: Ankle Swelling-A-AH, Difficult Call-A-AH

## 2023-11-02 NOTE — Telephone Encounter (Signed)
 error

## 2023-11-03 ENCOUNTER — Ambulatory Visit: Payer: Medicare Other | Admitting: Family Medicine

## 2023-11-03 ENCOUNTER — Encounter: Payer: Self-pay | Admitting: Family Medicine

## 2023-11-03 VITALS — BP 173/80 | HR 60 | Temp 98.6°F | Ht <= 58 in | Wt 120.2 lb

## 2023-11-03 DIAGNOSIS — R600 Localized edema: Secondary | ICD-10-CM

## 2023-11-03 DIAGNOSIS — R7303 Prediabetes: Secondary | ICD-10-CM

## 2023-11-03 DIAGNOSIS — R0602 Shortness of breath: Secondary | ICD-10-CM | POA: Diagnosis not present

## 2023-11-03 DIAGNOSIS — R350 Frequency of micturition: Secondary | ICD-10-CM | POA: Diagnosis not present

## 2023-11-03 DIAGNOSIS — I10 Essential (primary) hypertension: Secondary | ICD-10-CM | POA: Diagnosis not present

## 2023-11-03 DIAGNOSIS — H5789 Other specified disorders of eye and adnexa: Secondary | ICD-10-CM

## 2023-11-03 DIAGNOSIS — R0609 Other forms of dyspnea: Secondary | ICD-10-CM

## 2023-11-03 DIAGNOSIS — E785 Hyperlipidemia, unspecified: Secondary | ICD-10-CM | POA: Diagnosis not present

## 2023-11-03 LAB — LIPID PANEL
Cholesterol: 127 mg/dL (ref 0–200)
HDL: 54.1 mg/dL (ref >39.00)
LDL Cholesterol: 60 mg/dL (ref 0–99)
NonHDL: 72.67
Total CHOL/HDL Ratio: 2
Triglycerides: 63 mg/dL (ref 0.0–149.0)
VLDL: 12.6 mg/dL (ref 0.0–40.0)

## 2023-11-03 LAB — CBC
HCT: 37 % (ref 36.0–46.0)
Hemoglobin: 12.3 g/dL (ref 12.0–15.0)
MCHC: 33.2 g/dL (ref 30.0–36.0)
MCV: 89.3 fL (ref 78.0–100.0)
Platelets: 225 10*3/uL (ref 150.0–400.0)
RBC: 4.15 Mil/uL (ref 3.87–5.11)
RDW: 13.8 % (ref 11.5–15.5)
WBC: 6.8 10*3/uL (ref 4.0–10.5)

## 2023-11-03 LAB — COMPREHENSIVE METABOLIC PANEL
ALT: 20 U/L (ref 0–35)
AST: 27 U/L (ref 0–37)
Albumin: 3.9 g/dL (ref 3.5–5.2)
Alkaline Phosphatase: 56 U/L (ref 39–117)
BUN: 12 mg/dL (ref 6–23)
CO2: 30 meq/L (ref 19–32)
Calcium: 8.8 mg/dL (ref 8.4–10.5)
Chloride: 96 meq/L (ref 96–112)
Creatinine, Ser: 0.74 mg/dL (ref 0.40–1.20)
GFR: 75.06 mL/min (ref >60.00)
Glucose, Bld: 103 mg/dL — ABNORMAL HIGH (ref 70–99)
Potassium: 4.2 meq/L (ref 3.5–5.1)
Sodium: 132 meq/L — ABNORMAL LOW (ref 135–145)
Total Bilirubin: 0.7 mg/dL (ref 0.2–1.2)
Total Protein: 6.7 g/dL (ref 6.0–8.3)

## 2023-11-03 LAB — URINALYSIS, ROUTINE W REFLEX MICROSCOPIC
Bilirubin Urine: NEGATIVE
Hgb urine dipstick: NEGATIVE
Ketones, ur: NEGATIVE
Leukocytes,Ua: NEGATIVE
Nitrite: NEGATIVE
Specific Gravity, Urine: 1.01 (ref 1.000–1.030)
Total Protein, Urine: NEGATIVE
Urine Glucose: NEGATIVE
Urobilinogen, UA: 0.2 (ref 0.0–1.0)
pH: 7 (ref 5.0–8.0)

## 2023-11-03 LAB — VITAMIN B12: Vitamin B-12: 277 pg/mL (ref 211–911)

## 2023-11-03 LAB — HEMOGLOBIN A1C: Hgb A1c MFr Bld: 6.1 % (ref 4.6–6.5)

## 2023-11-03 LAB — TSH: TSH: 3.32 u[IU]/mL (ref 0.35–5.50)

## 2023-11-03 LAB — URIC ACID: Uric Acid, Serum: 3.5 mg/dL (ref 2.4–7.0)

## 2023-11-03 NOTE — Patient Instructions (Addendum)
It was very nice to see you today!  We will check blood work and a urine sample.  We will have you follow-up with cardiology.  Also refer you to see the eye doctor.  You may have overactive bladder but we will make sure that you do not have a urinary tract infection.  Please monitor your blood pressure and let us know if persistently elevated.  Return if symptoms worsen or fail to improve.   Take care, Dr Jimmey Ralph  PLEASE NOTE:  If you had any lab tests, please let us know if you have not heard back within a few days. You may see your results on mychart before we have a chance to review them but we will give you a call once they are reviewed by [place].   If we ordered any referrals today, please let us know if you have not heard from their office within the next week.   If you had any urgent prescriptions sent in today, please check with the pharmacy within an hour of our visit to make sure the prescription was transmitted appropriately.   Please try these tips to maintain a healthy lifestyle:  Eat at least 3 REAL meals and 1-2 snacks per day.  Aim for no more than 5 hours between eating.  If you eat breakfast, please do so within one hour of getting up.   Each meal should contain half fruits/vegetables, one quarter protein, and one quarter carbs (no bigger than a computer mouse)  Cut down on sweet beverages. This includes juice, soda, and sweet tea.   Drink at least 1 glass of water with each meal and aim for at least 8 glasses per day  Exercise at least 150 minutes every week.

## 2023-11-03 NOTE — Progress Notes (Signed)
Lynn [name] is a 83 y.o. female who presents today for an office visit.  Assessment/Plan:  New/Acute Problems: Dyspnea on Exertion This has been progressive for the last few months.  No current chest pain.  No shortness of breath at rest.  Her exam today is overall reassuring. EKG shows sinus bradycardia.  Will check labs.  She does have trace pretibial edema which is at her baseline.  Weight is actually down since last time and no other signs or symptoms concerning for volume overload-doubt CHF.  Wells score 0-doubt PE.  Concern for possible anginal equivalent.  Will have her follow-up with cardiology for cardiac evaluation.  We discussed reasons to return to care or seek emergent care.  Urinary Frequency Check UA and urine culture to rule out UTI. May have OAB.  Will consider antispasmodic if her culture is negative.  Eye Irritation  Obvious abnormalities on exam.  Referred ophthalmology.  No red flags.  Chronic Problems Addressed Today: Hyperlipidemia On Lipitor 10 mg daily.  Check lipids.  Prediabetes Check A1c.  Leg edema Trace edema today which is at her baseline.  No signs of volume overload today.  Hypertension Elevated today though better controlled at previous visits with cardiology.  Daughter states that she has not taken her medication this morning.  Will continue her regimen per cardiology with losartan 50 mg twice daily, metoprolol tartrate 25 mg twice daily, hydralazine 25 mg twice daily.  They will monitor at home and let us know if persistently elevated.     Subjective:  HPI:  See Assessment / plan for status of chronic conditions.  She would like to have blood work today. Her sister was recently diagnosed with gout and she would like to be checked for this. She would like to have complete blood work for this.   She is having more shortness of breath with exertion. This has been going on for a few months. More noticeable when going up a flight of  stairs. No symptoms when walking on flat ground. Occasionally gets a heaviness in her chest with exertion. No orthopnea. Swelling in her legs has not changed significantly.   She has had more urinary frequency for the last few weeks. No dysuria.   She did have some eye irritation. Optometrist tried artificial tears without much improvement. Her symptoms have not changed much. Still has a lot of tearing. Vision has been the same.       Objective:  Physical Exam: BP (!) 173/80   Pulse 60   Temp 98.6 F (37 C) (Temporal)   Ht 4\' 9"  (1.448 m)   Wt 120 lb 3.2 oz (54.5 kg)   SpO2 98%   BMI 26.01 kg/m   Wt Readings from Last 3 Encounters:  11/03/23 120 lb 3.2 oz (54.5 kg)  08/06/23 125 lb 8 oz (56.9 kg)  06/04/23 122 lb (55.3 kg)    Gen: No acute distress, resting comfortably CV: Regular rate and rhythm with no murmurs appreciated.  Trace pretibial edema bilaterally. Pulm: Normal work of breathing, clear to auscultation bilaterally with no crackles, wheezes, or rhonchi Neuro: Grossly normal, moves all extremities Psych: Normal affect and thought content  EKG Sinus bradycardia. No ischemic changes.   Time Spent: 40 minutes of total time was spent on the date of the encounter performing the following actions: chart review prior to seeing the patient, obtaining history, performing a medically necessary exam, counseling on the treatment plan, placing orders, and documenting in our EHR. This does  not include time spent interpreting above EKG       Imran Nuon M. Jimmey Ralph, MD 11/03/2023 9:50 AM

## 2023-11-03 NOTE — Assessment & Plan Note (Signed)
Trace edema today which is at her baseline.  No signs of volume overload today.

## 2023-11-03 NOTE — Assessment & Plan Note (Signed)
Check A1c.

## 2023-11-03 NOTE — Assessment & Plan Note (Signed)
On Lipitor 10 mg daily.  Check lipids.

## 2023-11-03 NOTE — Assessment & Plan Note (Addendum)
Elevated today though better controlled at previous visits with cardiology.  Daughter states that she has not taken her medication this morning.  Will continue her regimen per cardiology with losartan 50 mg twice daily, metoprolol tartrate 25 mg twice daily, hydralazine 25 mg twice daily.  They will monitor at home and let us know if persistently elevated.

## 2023-11-04 LAB — URINE CULTURE
MICRO NUMBER:: 16069681
Result:: NO GROWTH
SPECIMEN QUALITY:: ADEQUATE

## 2023-11-06 ENCOUNTER — Encounter: Payer: Self-pay | Admitting: Family Medicine

## 2023-11-06 ENCOUNTER — Other Ambulatory Visit: Payer: Self-pay | Admitting: Family Medicine

## 2023-11-06 DIAGNOSIS — I10 Essential (primary) hypertension: Secondary | ICD-10-CM

## 2023-11-06 NOTE — Progress Notes (Signed)
Her urine culture was negative.  She does not have a UTI.  She may have overactive bladder.  She should let us know if her urinary symptoms are not improving and we can refer her to urology or discuss starting a medication for this.  Her sodium is a little bit low.  I know she has had issues with this in the past.  Recommend she come back here in a week or 2 to recheck.  Please place future order for BMET  Her blood sugar is slightly elevated but still at a borderline range.  We can recheck in 6 to 12 months.  She should work on reducing sugar intake from her diet.  Rest of her labs are all at goal.  We can recheck everything else in a year.  Recommend that she see cardiology soon as we discussed in her office visit.  Please make sure that she has a follow-up visit scheduled.

## 2023-11-09 ENCOUNTER — Other Ambulatory Visit: Payer: Self-pay | Admitting: *Deleted

## 2023-11-09 DIAGNOSIS — E871 Hypo-osmolality and hyponatremia: Secondary | ICD-10-CM

## 2023-12-08 ENCOUNTER — Other Ambulatory Visit: Payer: Self-pay | Admitting: Physical Medicine and Rehabilitation

## 2023-12-08 ENCOUNTER — Telehealth: Payer: Self-pay | Admitting: Physical Medicine and Rehabilitation

## 2023-12-08 DIAGNOSIS — M5416 Radiculopathy, lumbar region: Secondary | ICD-10-CM

## 2023-12-08 NOTE — Telephone Encounter (Signed)
 Pt daughter is calling to request an appt for pt. she stated the pt is having pain in her lower back

## 2023-12-19 ENCOUNTER — Other Ambulatory Visit: Payer: Self-pay | Admitting: Family Medicine

## 2023-12-19 DIAGNOSIS — E785 Hyperlipidemia, unspecified: Secondary | ICD-10-CM

## 2023-12-21 ENCOUNTER — Other Ambulatory Visit: Payer: Self-pay

## 2023-12-21 ENCOUNTER — Ambulatory Visit (INDEPENDENT_AMBULATORY_CARE_PROVIDER_SITE_OTHER): Admitting: Physical Medicine and Rehabilitation

## 2023-12-21 ENCOUNTER — Encounter: Payer: Self-pay | Admitting: Physical Medicine and Rehabilitation

## 2023-12-21 VITALS — BP 144/70 | HR 60

## 2023-12-21 DIAGNOSIS — M5416 Radiculopathy, lumbar region: Secondary | ICD-10-CM | POA: Diagnosis not present

## 2023-12-21 DIAGNOSIS — M48061 Spinal stenosis, lumbar region without neurogenic claudication: Secondary | ICD-10-CM | POA: Diagnosis not present

## 2023-12-21 DIAGNOSIS — R634 Abnormal weight loss: Secondary | ICD-10-CM | POA: Insufficient documentation

## 2023-12-21 DIAGNOSIS — M25562 Pain in left knee: Secondary | ICD-10-CM | POA: Diagnosis not present

## 2023-12-21 DIAGNOSIS — M545 Low back pain, unspecified: Secondary | ICD-10-CM

## 2023-12-21 DIAGNOSIS — M47816 Spondylosis without myelopathy or radiculopathy, lumbar region: Secondary | ICD-10-CM

## 2023-12-21 DIAGNOSIS — G8929 Other chronic pain: Secondary | ICD-10-CM

## 2023-12-21 MED ORDER — METHYLPREDNISOLONE ACETATE 40 MG/ML IJ SUSP
40.0000 mg | Freq: Once | INTRAMUSCULAR | Status: AC
  Administered 2023-12-21: 40 mg

## 2023-12-21 NOTE — Procedures (Signed)
 Lumbar Facet Joint Intra-Articular Injection(s) with Fluoroscopic Guidance  Patient: Lynn [name]      Date of Birth: 1941/04/30 MRN: [id number] PCP: Ardith Dark, MD      Visit Date: 12/21/2023   Universal Protocol:    Date/Time: 12/21/2023  Consent Given By: the patient  Position: PRONE   Additional Comments: Vital signs were monitored before and after the procedure. Patient was prepped and draped in the usual sterile fashion. The correct patient, procedure, and site was verified.   Injection Procedure Details:  Procedure Site One Meds Administered:  Meds ordered this encounter  Medications   methylPREDNISolone acetate (DEPO-MEDROL) injection 40 mg     Laterality: Bilateral  Location/Site:  L4-L5  Needle size: 22 guage  Needle type: Spinal  Needle Placement: Articular  Findings:  -Comments: Excellent flow of contrast producing a partial arthrogram.  Procedure Details: The fluoroscope beam is vertically oriented in AP, and the inferior recess is visualized beneath the lower pole of the inferior apophyseal process, which represents the target point for needle insertion. When direct visualization is difficult the target point is located at the medial projection of the vertebral pedicle. The region overlying each aforementioned target is locally anesthetized with a 1 to 2 ml. volume of 1% Lidocaine without Epinephrine.   The spinal needle was inserted into each of the above mentioned facet joints using biplanar fluoroscopic guidance. A 0.25 to 0.5 ml. volume of Isovue-250 was injected and a partial facet joint arthrogram was obtained. A single spot film was obtained of the resulting arthrogram.    One to 1.25 ml of the steroid/anesthetic solution was then injected into each of the facet joints noted above.   Additional Comments:  No complications occurred Dressing: 2 x 2 sterile gauze and Band-Aid    Post-procedure details: Patient was observed  during the procedure. Post-procedure instructions were reviewed.  Patient left the clinic in stable condition.

## 2023-12-21 NOTE — Patient Instructions (Signed)

## 2023-12-21 NOTE — Progress Notes (Signed)
 Lynn [name] - 83 y.o. female MRN [id number]  Date of birth: 09/18/1941  Office Visit Note: Visit Date: 12/21/2023 PCP: Ardith Dark, MD Referred by: Ardith Dark, MD  Subjective: Chief Complaint  Patient presents with   Lower Back - Pain   HPI: Lynn [name] is a 83 y.o. female who comes in today for evaluation and management of chronic, recalcitrant and severe mostly axial low back pain and left knee pain.  She is present with her daughter who provides translation.  She has had 3 injections in the past for her lumbar spine and these were an interlaminar injection that was not very helpful couple years ago and then a transforaminal injection at L4 which was greatly successful for about 4 months but she did have some radicular component at the time.  The last injection was in January of this year which was a bilateral L4 transforaminal repeat injection which offered some benefit for 2 to 3 weeks but it was very minimal if any.  MRI findings show arthritic change at L4-5 a left-sided transitional segment at L5 partially sacralized with pseudo articulation but no high-grade central stenosis.  There was biforaminal stenosis of the foramen at L4.  Since we have seen her and she did not get much relief she has been having just mainly axial back pain worse with standing and bending better with rest.  No real claudication symptoms no symptoms down into the hips or legs.  No paresthesias or tingling.  She does endorse left knee pain.  In the past in our office she is seeing a combination of Dr. Magnus Ivan and Dr.Hilts and Dr. Cleophas Dunker for her orthopedic complaints and joint pain.  The last person she saw for her knee was Dr. Cleophas Dunker.  She has not had any injury to the knee or swelling or locking.  She has had physical therapy in the past for her joints and her back.  She tries to stay active.  She has had medication management without much relief.  Her case is complicated by  some dementia and anxiety.   I spent more than 30 minutes speaking face-to-face with the patient with 50% of the time in counseling and discussing coordination of care.      Review of Systems  Musculoskeletal:  Positive for back pain and joint pain.  All other systems reviewed and are negative.  Otherwise per HPI.  Assessment & Plan: Visit Diagnoses:    ICD-10-CM   1. Spondylosis without myelopathy or radiculopathy, lumbar region  M47.816 XR C-ARM NO REPORT    Epidural Steroid injection    methylPREDNISolone acetate (DEPO-MEDROL) injection 40 mg    2. Chronic bilateral low back pain without sciatica  M54.50    G89.29     3. Foraminal stenosis of lumbar region  M48.061     4. Chronic pain of left knee  M25.562    G89.29        Plan: Findings:  1.  Chronic and worsening and severe and recalcitrant mostly axial low back pain that I feel like is now likely facet mediated low back pain given the fact that the last epidural injection did not seem to help.  I reviewed those images from the prior injection and it was exactly the same as the one for the did help.  Discussed with him that I would like to see them back sooner than a few months if it did not help and we really reiterated that illness visit.  I  do think facet joint blocks would be beneficial diagnostically and hopefully therapeutically.  If she does not get relief with that I would look at updating her lumbar spine MRI which was done in 2021.  She will continue with current activity level.  2.  Left knee pain is likely osteoarthritic in nature as she has a history of bilateral primary osteoarthritis of the knees.  She has not had a knee replacement.  She has had some injection treatment in the past.  Depending on her complaints would have her see Dr. Shon Baton or [place] Persons, PA-C who she has seen in the past with Dr. Cleophas Dunker.    Meds & Orders:  Meds ordered this encounter  Medications   methylPREDNISolone acetate  (DEPO-MEDROL) injection 40 mg    Orders Placed This Encounter  Procedures   XR C-ARM NO REPORT   Epidural Steroid injection    Follow-up: Return if symptoms worsen or fail to improve.   Procedures: No procedures performed  Lumbar Facet Joint Intra-Articular Injection(s) with Fluoroscopic Guidance  Patient: Lynn [name]      Date of Birth: 1941/04/17 MRN: [id number] PCP: Ardith Dark, MD      Visit Date: 12/21/2023   Universal Protocol:    Date/Time: 12/21/2023  Consent Given By: the patient  Position: PRONE   Additional Comments: Vital signs were monitored before and after the procedure. Patient was prepped and draped in the usual sterile fashion. The correct patient, procedure, and site was verified.   Injection Procedure Details:  Procedure Site One Meds Administered:  Meds ordered this encounter  Medications   methylPREDNISolone acetate (DEPO-MEDROL) injection 40 mg     Laterality: Bilateral  Location/Site:  L4-L5  Needle size: 22 guage  Needle type: Spinal  Needle Placement: Articular  Findings:  -Comments: Excellent flow of contrast producing a partial arthrogram.  Procedure Details: The fluoroscope beam is vertically oriented in AP, and the inferior recess is visualized beneath the lower pole of the inferior apophyseal process, which represents the target point for needle insertion. When direct visualization is difficult the target point is located at the medial projection of the vertebral pedicle. The region overlying each aforementioned target is locally anesthetized with a 1 to 2 ml. volume of 1% Lidocaine without Epinephrine.   The spinal needle was inserted into each of the above mentioned facet joints using biplanar fluoroscopic guidance. A 0.25 to 0.5 ml. volume of Isovue-250 was injected and a partial facet joint arthrogram was obtained. A single spot film was obtained of the resulting arthrogram.    One to 1.25 ml of the  steroid/anesthetic solution was then injected into each of the facet joints noted above.   Additional Comments:  No complications occurred Dressing: 2 x 2 sterile gauze and Band-Aid    Post-procedure details: Patient was observed during the procedure. Post-procedure instructions were reviewed.  Patient left the clinic in stable condition.    Clinical History: EXAM: MRI LUMBAR SPINE WITHOUT CONTRAST   TECHNIQUE: Multiplanar, multisequence MR imaging of the lumbar spine was performed. No intravenous contrast was administered.   COMPARISON:  Lumbar spine radiographs 01/27/2020   FINDINGS: Segmentation: The lowest fully formed intervertebral disc space is designated L5-S1. L5 is mildly transitional.   Alignment:  Trace retrolisthesis of L1 on L2 and L2 on L3.   Vertebrae: No fracture or suspicious osseous lesion. Scattered small Schmorl's nodes. Degenerative endplate changes throughout the lumbar spine including mild edema.   Conus medullaris and cauda equina: Conus extends  to the L1 level. Conus and cauda equina appear normal.   Paraspinal and other soft tissues: Unremarkable.   Disc levels:   Disc desiccation throughout the lumbar spine. Severe disc space narrowing at L1-2 and L4-5 and mild narrowing at L2-3 and L3-4.   T11-12: Minimal disc bulging without stenosis.   T12-L1: Mild disc bulging without stenosis.   L1-2: Left eccentric disc bulging results in mild left neural foraminal stenosis without spinal stenosis.   L2-3: Disc bulging and a small superimposed left foraminal disc protrusion result in mild bilateral lateral recess stenosis and mild left neural foraminal stenosis without significant spinal stenosis.   L3-4: Disc bulging slightly eccentric to the right results in borderline lateral recess stenosis and mild right neural foraminal stenosis without spinal stenosis.   L4-5: Disc bulging, endplate spurring, severe disc space height loss, and mild  facet and ligamentum flavum hypertrophy result in moderate to severe bilateral neural foraminal stenosis with potential bilateral L4 nerve root impingement. No spinal stenosis.   L5-S1: Right eccentric disc bulging and mild right facet hypertrophy result in mild-to-moderate right neural foraminal stenosis. There is a small central disc protrusion without spinal stenosis.   IMPRESSION: 1. 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 2. Mild-to-moderate right neural foraminal stenosis at L5-S1. 3. No significant spinal stenosis.     Electronically Signed   By: Sebastian Ache M.D.   On: 07/08/2020 15:20   She reports that she has never smoked. She has never used smokeless tobacco.  Recent Labs    05/21/23 1319 11/03/23 1008  HGBA1C 5.8* 6.1  LABURIC  --  3.5    Objective:  VS:  HT:    WT:   BMI:     BP:(!) 144/70  HR:60bpm  TEMP: ( )  RESP:  Physical Exam  Ortho Exam  Imaging: No results found.  Past Medical/Family/Surgical/Social History: Medications & Allergies reviewed per EMR, new medications updated. 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 No pertinent surgical history. Social History   Occupational History   Occupation: Retired  Tobacco Use   Smoking status: Never   Smokeless tobacco: Never  Vaping Use   Vaping status: Never Used  Substance and Sexual Activity   Alcohol use: No   Drug use: No   Sexual activity: Not Currently

## 2023-12-21 NOTE — Progress Notes (Signed)
 Pain Scale   Average Pain 7        +Driver, -BT, -Dye Allergies.

## 2023-12-31 ENCOUNTER — Ambulatory Visit (INDEPENDENT_AMBULATORY_CARE_PROVIDER_SITE_OTHER): Payer: Medicare Other | Admitting: Cardiology

## 2023-12-31 VITALS — BP 138/62 | HR 64 | Ht <= 58 in | Wt 118.5 lb

## 2023-12-31 DIAGNOSIS — R079 Chest pain, unspecified: Secondary | ICD-10-CM

## 2023-12-31 DIAGNOSIS — R0609 Other forms of dyspnea: Secondary | ICD-10-CM

## 2023-12-31 DIAGNOSIS — E782 Mixed hyperlipidemia: Secondary | ICD-10-CM

## 2023-12-31 NOTE — Progress Notes (Signed)
  Cardiology Office Note:  .   Date:  12/31/2023  ID:  Docia Barrier, DOB [birth year], MRN [id number] PCP: Ardith Dark, MD  [facility] Providers Cardiologist:  Donato Schultz, MD     History of Present Illness: Lynn [name]   Lynn [name] is a 83 y.o. female Discussed the use of AI scribe software for clinical note transcription with the patient, who gave verbal consent to proceed.  History of Present Illness Lynn [name] is an 83 year old female with hypertension who presents with shortness of breath.  She experiences shortness of breath, a new symptom over the past three to four months, occurring during activities such as walking inside her house. This is sometimes accompanied by a sensation of chest heaviness. Both symptoms tend to improve with rest.  She has a history of hypertension and is currently on a regimen of hydralazine 25 mg in the morning and at bedtime, losartan 50 mg in the morning and at bedtime, and metoprolol tartrate 25 mg twice a day. Previously, she was on a higher dose of metoprolol tartrate at 50 mg.  Her physical activity is limited; she primarily sits on the couch and only walks inside the house, with minimal exercise such as going up and down stairs.  A prior stress test conducted in 2021 was reported as low risk.   Her daughter is present and assists with history and translation    Studies Reviewed: .        Results LABS LDL: 60 mg/dL Creatinine: 0.7 mg/dL N0U: 7.2% Risk Assessment/Calculations:            Physical Exam:   VS:  BP 138/62   Pulse 64   Ht 4\' 9"  (1.448 m)   Wt 118 lb 8 oz (53.8 kg)   SpO2 97%   BMI 25.64 kg/m    Wt Readings from Last 3 Encounters:  12/31/23 118 lb 8 oz (53.8 kg)  11/03/23 120 lb 3.2 oz (54.5 kg)  08/06/23 125 lb 8 oz (56.9 kg)    GEN: Well nourished, well developed in no acute distress NECK: No JVD; No carotid bruits CARDIAC: RRR, no murmurs, no rubs, no  gallops RESPIRATORY:  Clear to auscultation without rales, wheezing or rhonchi  ABDOMEN: Soft, non-tender, non-distended EXTREMITIES:  No edema; No deformity   ASSESSMENT AND PLAN: .    Assessment and Plan Assessment & Plan Shortness of breath and chest heaviness New onset shortness of breath and chest heaviness over the past three to four months, relieved with rest and occurring with minimal exertion. Differential diagnosis includes ischemic heart disease or heart failure, considering her age and hypertension. Echocardiogram and nuclear stress test [name]) are planned to assess cardiac function, structure, and blood flow, determining any high-risk cardiac condition. - Order echocardiogram to assess cardiac function and structure. - Schedule nuclear stress test (Lexiscan) to evaluate for ischemia and assess blood flow to the heart.  Hypertension On multiple antihypertensive medications including hydralazine, losartan, and metoprolol. Blood pressure control is crucial given cardiovascular symptoms and risk factors. - Continue current antihypertensive regimen.  Hyperlipidemia LDL is 60, indicating good control. On atorvastatin for lipid management. - Continue atorvastatin as prescribed.  Follow-up Follow-up is necessary to reassess symptoms and evaluate test results. - Schedule follow-up appointment in six months with an Advanced Practice Provider (APP).           Signed, Donato Schultz, MD

## 2023-12-31 NOTE — Patient Instructions (Signed)
 Medication Instructions:  Your physician recommends that you continue on your current medications as directed. Please refer to the Current Medication list given to you today.  Testing/Procedures: Your physician has requested that you have an echocardiogram. Echocardiography is a painless test that uses sound waves to create images of your heart. It provides your doctor with information about the size and shape of your heart and how well your heart's chambers and valves are working. This procedure takes approximately one hour. There are no restrictions for this procedure. Please do NOT wear cologne, perfume, aftershave, or lotions (deodorant is allowed). Please arrive 15 minutes prior to your appointment time.  Please note: We ask at that you not bring children with you during ultrasound (echo/ vascular) testing. Due to room size and safety concerns, children are not allowed in the ultrasound rooms during exams. Our front office staff cannot provide observation of children in our lobby area while testing is being conducted. An adult accompanying a patient to their appointment will only be allowed in the ultrasound room at the discretion of the ultrasound technician under special circumstances. We apologize for any inconvenience.  Your provider has recommended a lexiscan Myoview.   Follow-Up: At [facility], you and your health needs are our priority.  As part of our continuing mission to provide you with exceptional heart care, our providers are all part of one team.  This team includes your primary Cardiologist (physician) and Advanced Practice Providers or APPs (Physician Assistants and Nurse Practitioners) who all work together to provide you with the care you need, when you need it.  Your next appointment:   6 months with APP

## 2024-02-01 ENCOUNTER — Telehealth: Payer: Self-pay | Admitting: *Deleted

## 2024-02-01 ENCOUNTER — Other Ambulatory Visit (HOSPITAL_BASED_OUTPATIENT_CLINIC_OR_DEPARTMENT_OTHER): Payer: Self-pay | Admitting: Family Medicine

## 2024-02-01 DIAGNOSIS — Z1231 Encounter for screening mammogram for malignant neoplasm of breast: Secondary | ICD-10-CM

## 2024-02-01 NOTE — Telephone Encounter (Signed)
 Copied from CRM [id number]. Topic: Referral - Question >> Feb 01, 2024  3:18 PM Jethro Morrison wrote: Reason for CRM: DAUGTHER CALLED STATED MOM WANTS A MAMMOGRAM AND DOES NOT WANT TO COME INTO THE OFFICE FOR A VISIT   Spoke with patient daughter, gave phone numbers for her to schedule a mammogram appt  [facility]

## 2024-02-02 ENCOUNTER — Encounter (HOSPITAL_COMMUNITY): Payer: Self-pay

## 2024-02-05 ENCOUNTER — Other Ambulatory Visit (HOSPITAL_BASED_OUTPATIENT_CLINIC_OR_DEPARTMENT_OTHER): Payer: Self-pay | Admitting: Cardiology

## 2024-02-05 ENCOUNTER — Encounter (HOSPITAL_BASED_OUTPATIENT_CLINIC_OR_DEPARTMENT_OTHER): Payer: Self-pay | Admitting: Cardiology

## 2024-02-05 DIAGNOSIS — R079 Chest pain, unspecified: Secondary | ICD-10-CM

## 2024-02-05 DIAGNOSIS — R0609 Other forms of dyspnea: Secondary | ICD-10-CM

## 2024-02-08 ENCOUNTER — Other Ambulatory Visit: Payer: Self-pay | Admitting: Cardiology

## 2024-02-08 DIAGNOSIS — R072 Precordial pain: Secondary | ICD-10-CM

## 2024-02-08 NOTE — Addendum Note (Signed)
 Addended by: Guss Legacy on: 02/08/2024 08:12 AM   Modules accepted: Orders

## 2024-02-09 ENCOUNTER — Ambulatory Visit (HOSPITAL_COMMUNITY)
Admission: RE | Admit: 2024-02-09 | Discharge: 2024-02-09 | Disposition: A | Discharge status: Home / Self Care | Source: Ambulatory Visit | Attending: Cardiology | Admitting: Cardiology

## 2024-02-09 ENCOUNTER — Ambulatory Visit: Payer: Self-pay | Admitting: Cardiology

## 2024-02-09 DIAGNOSIS — R0609 Other forms of dyspnea: Secondary | ICD-10-CM

## 2024-02-09 DIAGNOSIS — R079 Chest pain, unspecified: Secondary | ICD-10-CM

## 2024-02-09 LAB — ECHOCARDIOGRAM COMPLETE
Area-P 1/2: 4.85 cm2
MV M vel: 6 m/s
MV Peak grad: 143.8 mmHg
P 1/2 time: 518 ms
Radius: 0.8 cm
S' Lateral: 2.5 cm

## 2024-02-09 LAB — MYOCARDIAL PERFUSION IMAGING
LV dias vol: 55 mL (ref 46–106)
LV sys vol: 11 mL
Nuc Stress EF: 80 %
Peak HR: 96 {beats}/min
Rest HR: 58 {beats}/min
Rest Nuclear Isotope Dose: 10 mCi
SDS: 2
SRS: 0
SSS: 2
ST Depression (mm): 0 mm
Stress Nuclear Isotope Dose: 32.6 mCi
TID: 1.11

## 2024-02-09 MED ORDER — TECHNETIUM TC 99M TETROFOSMIN IV KIT
10.0000 | PACK | Freq: Once | INTRAVENOUS | Status: AC | PRN
  Administered 2024-02-09: 10 via INTRAVENOUS

## 2024-02-09 MED ORDER — REGADENOSON 0.4 MG/5ML IV SOLN
INTRAVENOUS | Status: AC
  Filled 2024-02-09: qty 5

## 2024-02-09 MED ORDER — REGADENOSON 0.4 MG/5ML IV SOLN
0.4000 mg | Freq: Once | INTRAVENOUS | Status: AC
  Administered 2024-02-09: 0.4 mg via INTRAVENOUS

## 2024-02-09 MED ORDER — TECHNETIUM TC 99M TETROFOSMIN IV KIT
32.6000 | PACK | Freq: Once | INTRAVENOUS | Status: AC | PRN
  Administered 2024-02-09: 32.6 via INTRAVENOUS

## 2024-02-11 ENCOUNTER — Encounter (HOSPITAL_BASED_OUTPATIENT_CLINIC_OR_DEPARTMENT_OTHER): Payer: Self-pay | Admitting: Cardiovascular Disease

## 2024-02-11 ENCOUNTER — Ambulatory Visit (INDEPENDENT_AMBULATORY_CARE_PROVIDER_SITE_OTHER): Payer: Medicare Other | Admitting: Cardiovascular Disease

## 2024-02-11 VITALS — BP 154/64 | HR 64 | Ht <= 58 in | Wt 121.7 lb

## 2024-02-11 DIAGNOSIS — E785 Hyperlipidemia, unspecified: Secondary | ICD-10-CM | POA: Diagnosis not present

## 2024-02-11 DIAGNOSIS — I10 Essential (primary) hypertension: Secondary | ICD-10-CM

## 2024-02-11 MED ORDER — HYDRALAZINE HCL 50 MG PO TABS: 50.0000 mg | ORAL_TABLET | Freq: Two times a day (BID) | ORAL | 3 refills | Status: AC

## 2024-02-11 NOTE — Patient Instructions (Addendum)
 Medication Instructions:  INCREASE YOUR HYDRALAZINE  TO 50 MG  TAKE YOU MEDICATIONS 12 HOURS APART   Labwork: NONE  Testing/Procedures: NONE  Follow-Up: 3 MONTHS IN ADV HTN WITH DR Marion, CAITLIN W NP, OR MICHELLE S NP   Any Other Special Instructions Will Be Listed Below (If Applicable).  If you need a refill on your cardiac medications before your next appointment, please call your pharmacy.

## 2024-02-11 NOTE — Progress Notes (Signed)
 Advanced Hypertension Clinic Initial Assessment:    Date:  02/11/2024   ID:  Lynn [name], DOB 08/06/1941, MRN [id number]  PCP:  Rodney Clamp, MD  Cardiologist:  Dorothye Gathers, MD   Referring MD: Rodney Clamp, MD   CC: Hypertension  History of Present Illness:    Lynn [name] is a 83 y.o. [place] female with a hx of hypertension, nonobstructive CAD, pre-diabetes, moderate cognitive impairment, and hyperlipidemia here to establish care in the Advanced Hypertension Clinic.   She is here today with her daughter who serves as her interpreter.  She first saw Dr. Renna Cary for hypertension in 2020.  At that time she was taking metoprolol  and losartan .  She had intermittent episodes of higher blood pressures and headaches.  He added HCTZ 12.5 mg daily to her regimen.  He noted that anxiety was likely contributing to her intermittently elevated blood pressures.  HCTZ was subsequently discontinued.   Prior to that she was seen in the ED for lightheadedness.  Family reported that he thought it was due to her blood pressure medications.  Family members were giving her both an ACE inhibitor and an ARB because they felt that it was controlling her blood pressure better than what the doctor had prescribed.  They also intermittently gave her amlodipine .  They noted that she has lightheadedness when she is tries to stand and ambulate.  In the ED her blood pressure was 139/53.  She was hyponatremic to 129 and hypokalemic to 3.4.  She was given IV fluids and discharged home.  MRI was negative for stroke.  She followed up with Dr. Renna Cary the following week.  At that visit she had amlodipine , losartan , valsartan , HCTZ, and metoprolol  listed as active.  She followed up with Dr. Daneil Dunker the following month and was noted to be on losartan  50 mg twice daily, amlodipine  2.5 mg daily, and metoprolol  tartrate 25 mg twice daily.  Blood pressures continue to be very labile and her daughter requested  referral to the advanced hypertension clinic.  They noted that she also continues to struggle with anxiety.  Renal artery Dopplers 09/2022 revealed normal flow bilaterally.  At her visit 12/2022 she reported blood pressures were variable throughout the day and worse in the evenings.  She gets very weak and tired when her blood pressure is elevated.  She noted that losartan  has been more effective than valsartan  for her blood pressure.  She was using captopril  on an as-needed basis.  She was noted to have orthostatic hypotension with blood pressure increasing from 178/82 when laying up to 205/84 when standing.  She was taking her ARB twice daily and captopril  on top of that.  She was given hydralazine  to use as a rescue and instructed to take lisinopril  once daily.  She was instructed to follow-up in 1 month but was not seen until 6 months later.  Lisinopril  was switched to olmesartan  due to cough.  She developed dizziness and this was switched to losartan  50 mg twice daily.  They noted episodes of hypotension to 105 systolic.  She saw [facility] 07/2023 and blood pressures at home are ranging in the 130s to 140s.  She saw Dr. Renna Cary and reported exertional dyspnea.  Echo 01/2024 revealed LVEF 65-70% with moderate MR and mild to moderate AR.  Nuclear stress test was low risk.  She was noted to have moderate coronary calcification.  Discussed the use of AI scribe software for clinical note transcription with the patient, who gave verbal consent  to proceed.  History of Present Illness Ms. Lynn [name] is accompanied by her daughter.  Her blood pressure has improved significantly since her last visit, with morning readings typically around 151/81 mmHg before taking her medication and daytime readings around 140/71 mmHg. She takes her blood pressure medication at 11 AM and 7 PM. She was hospitalized for four days due to low sodium levels after a medication change last year, but her blood pressure has since  stabilized. Initially, she experienced low sodium levels and symptoms of feeling faint, which led to an emergency room visit. Her sodium levels were corrected during a hospital stay.  She currently takes hydralazine  25 mg, losartan , and atorvastatin . She does not engage in regular exercise due to complaints of lower back and knee pain, although she walks inside the house. Her daughter encourages her to walk outside, but she is reluctant due to discomfort.  She underwent an echocardiogram and stress test recently. She experienced shortness of breath and fatigue during the stress test. Her daughter notes that she has a healthy diet but eats small portions, and her weight has decreased slightly from 125 lbs to 119 lbs over the past five to six years.  No recent episodes of low blood pressure or fainting since the medication adjustment.  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 No pertinent surgical history.  Current Medications: Current Meds  Medication Sig   aspirin  EC 81 MG tablet Take 81 mg by mouth every other day. Swallow whole.   atorvastatin  (LIPITOR) 10 MG tablet TAKE 1 TABLET (10 MG TOTAL) BY MOUTH DAILY AT 6 PM.   baclofen  (LIORESAL ) 10 MG tablet Take 0.5-1 tablets (5-10 mg total) by mouth 3 (three) times daily as needed for muscle spasms.   calcium  carbonate (OS-CAL - DOSED IN MG OF ELEMENTAL CALCIUM ) 1250 (500 Ca) MG tablet Take 1 tablet by mouth 3 (three) times a week.   diclofenac  Sodium (VOLTAREN ) 1 % GEL APPLY 4 G TOPICALLY 4 (FOUR) TIMES DAILY AS NEEDED.   gabapentin  (NEURONTIN ) 100 MG capsule Take 1 capsule (100 mg total) by  mouth at bedtime.   ibuprofen (ADVIL,MOTRIN) 200 MG tablet Take 200 mg by mouth every 6 (six) hours as needed.   losartan  (COZAAR ) 50 MG tablet Take 1 tablet (50 mg total) by mouth in the morning and at bedtime.   meclizine  (ANTIVERT ) 25 MG tablet Take 1 tablet (25 mg total) by mouth 3 (three) times daily as needed for dizziness.   metoprolol  tartrate (LOPRESSOR ) 25 MG tablet TAKE 1 TABLET BY MOUTH TWICE A DAY   Multiple Vitamin (MULTIVITAMIN) tablet Take 1 tablet by mouth 2 (two) times a week.   Pyridoxine HCl (VITAMIN B-6 PO) Take by mouth.   Thiamine HCl (VITAMIN B-1 PO) Take by mouth.   [DISCONTINUED] hydrALAZINE  (APRESOLINE ) 25 MG tablet TAKE 1 TABLET (25 MG) BY MOUTH IN THE MORNING AND AT BEDTIME     Allergies:   Norvasc  [amlodipine ] and Olmesartan    Social History   Socioeconomic History   Marital status: Widowed    Spouse name: Not on file   Number of children: 2   Years of education: some college   Highest  education level: Not on file  Occupational History   Occupation: Retired  Tobacco Use   Smoking status: Never   Smokeless tobacco: Never  Vaping Use   Vaping status: Never Used  Substance and Sexual Activity   Alcohol use: No   Drug use: No   Sexual activity: Not Currently  Other Topics Concern   Not on file  Social History Narrative   Pt lives with her daughter and her daughter's family (husband and 2 children) in 2 story home   Has 2 adult children   Some college education - however credits did not transfer to [place]    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; Warehouse manager work for office   Social Drivers of Corporate investment banker Strain: Low Risk  (03/24/2023)   Overall Financial Resource Strain (CARDIA)    Difficulty of Paying Living Expenses: Not hard at all  Food Insecurity: No Food Insecurity (03/24/2023)   Hunger Vital Sign    Worried About Running Out of Food in the Last Year: Never true    Ran Out of Food in the Last Year: Never true  Transportation Needs: No  Transportation Needs (03/24/2023)   PRAPARE - Administrator, Civil Service (Medical): No    Lack of Transportation (Non-Medical): No  Physical Activity: Inactive (03/24/2023)   Exercise Vital Sign    Days of Exercise per Week: 0 days    Minutes of Exercise per Session: 0 min  Stress: No Stress Concern Present (03/24/2023)   [employer] of Occupational Health - Occupational Stress Questionnaire    Feeling of Stress : Not at all  Social Connections: Socially Isolated (03/24/2023)   Social Connection and Isolation Panel [NHANES]    Frequency of Communication with Friends and Family: Twice a week    Frequency of Social Gatherings with Friends and Family: Three times a week    Attends Religious Services: Never    Active Member of Clubs or Organizations: No    Attends Banker Meetings: Never    Marital Status: Widowed     Family History: The patient's family history includes Diabetes in her mother; Diverticulitis in her mother; Heart attack in her father and sister; Hypertension in her sister; Other in her father; Stroke in her sister.  ROS:   Please see the history of present illness.     All other systems reviewed and are negative.  EKGs/Labs/Other Studies Reviewed:    EKG:  EKG is not ordered today.   Recent Labs: 08/06/2023: BNP 99.3 11/03/2023: ALT 20; BUN 12; Creatinine, Ser 0.74; Hemoglobin 12.3; Platelets 225.0; Potassium 4.2; Sodium 132; TSH 3.32   Recent Lipid Panel    Component Value Date/Time   CHOL 127 11/03/2023 1008   CHOL 139 05/21/2023 1319   TRIG 63.0 11/03/2023 1008   HDL 54.10 11/03/2023 1008   HDL 60 05/21/2023 1319   CHOLHDL 2 11/03/2023 1008   VLDL 12.6 11/03/2023 1008   LDLCALC 60 11/03/2023 1008   LDLCALC 61 05/21/2023 1319    Physical Exam:   VS:  BP (!) 154/64   Pulse 64   Ht 4\' 9"  (1.448 m)   Wt 121 lb 11.2 oz (55.2 kg)   SpO2 97%   BMI 26.34 kg/m  , BMI Body mass index is 26.34 kg/m. GENERAL:  Well  appearing HEENT: Pupils equal round and reactive, fundi not visualized, oral mucosa unremarkable NECK:  No jugular venous distention, waveform within normal limits, carotid upstroke brisk and symmetric, no bruits, no  thyromegaly LUNGS:  Clear to auscultation bilaterally HEART:  RRR.  PMI not displaced or sustained,S1 and S2 within normal limits, no S3, no S4, no clicks, no rubs, I/VI systolic murmur at the LUSB ABD:  Flat, positive bowel sounds normal in frequency in pitch, no bruits, no rebound, no guarding, no midline pulsatile mass, no hepatomegaly, no splenomegaly EXT:  2 plus pulses throughout, no edema, no cyanosis no clubbing SKIN:  No rashes no nodules NEURO:  Cranial nerves II through XII grossly intact, motor grossly intact throughout PSYCH:  Cognitively intact, oriented to person place and time   ASSESSMENT/PLAN:    Assessment & Plan # Non-obstructive coronary artery disease Stress test showed mild coronary artery disease with blockages but adequate blood flow. No chest pain. Emphasized aggressive cholesterol and blood pressure management to prevent progression. - Continue atorvastatin . - Continue aspirin  therapy. - Aggressively manage blood pressure.  # Hypertension Hypertension better controlled with medication adjustments. However still above goal.  Morning readings high at 151/81 mmHg, improving to 140/71 mmHg during the day. Medication timing may contribute to morning hypertension. - Adjust medication timing to 10 AM and 10 PM. - Increase hydralazine  to 50 mg twice daily. 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 Requires monitoring.  # Exercise and cardiovascular health Discussed exercise importance. Lower back and knee pain limit walking. Suggested water aerobics or stationary bike to reduce joint strain. - Encourage regular exercise,  including walking, water aerobics, or stationary bike.  # Follow-up Plan to reassess blood pressure control and overall health in a few months. - Schedule follow-up appointment in a few months.   Screening for Secondary Hypertension:     01/20/2023    4:13 PM  Causes  Drugs/Herbals Screened     - Comments No tobacco,no EtOH, limits salt  Renovascular HTN N/A  Sleep Apnea Screened     - Comments no snoring  Thyroid  Disease Screened    Relevant Labs/Studies:    Latest Ref Rng & Units 11/03/2023   10:08 AM 08/06/2023    2:28 PM 05/21/2023    1:19 PM  Basic Labs  Sodium 135 - 145 mEq/L 132  138  133   Potassium 3.5 - 5.1 mEq/L 4.2  4.6  5.0   Creatinine 0.40 - 1.20 mg/dL 1.61  0.96  0.45        Latest Ref Rng & Units 11/03/2023   10:08 AM 02/05/2023    9:23 PM  Thyroid    TSH 0.35 - 5.50 uIU/mL 3.32  2.632        Latest Ref Rng & Units 02/02/2023    9:44 AM  Renin/Aldosterone   Aldosterone 0.0 - 30.0 ng/dL 40.9   Aldos/Renin Ratio 0.0 - 30.0 23.3        Latest Ref Rng & Units 02/02/2023    9:44 AM  Metanephrines/Catecholamines   Epinephrine 0 - 62 pg/mL 49   Norepinephrine 0 - 874 pg/mL 758   Dopamine 0 - 48 pg/mL <30   Metanephrines 0.0 - 88.0 pg/mL <25.0   Normetanephrines  0.0 - 297.2 pg/mL 52.3           09/26/2022   11:02 AM  Renovascular   Renal Artery US  Completed Yes     Disposition:    FU with MD/PharmD in 3 months   Medication Adjustments/Labs and Tests Ordered: Current medicines are reviewed at length with the patient today.  Concerns regarding medicines are outlined above.  No orders of the defined types were placed in this encounter.  Meds ordered this encounter  Medications   hydrALAZINE  (APRESOLINE ) 50 MG tablet    Sig: Take 1 tablet (50 mg total) by mouth every 12 (twelve) hours.    Dispense:  180 tablet    Refill:  3     Signed, Maudine Sos, MD  02/11/2024 9:52 AM    [facility]

## 2024-02-15 NOTE — Addendum Note (Signed)
 Addended by: Hugh Madura on: 02/15/2024 06:40 AM   Modules accepted: Orders

## 2024-02-16 ENCOUNTER — Inpatient Hospital Stay (HOSPITAL_BASED_OUTPATIENT_CLINIC_OR_DEPARTMENT_OTHER): Admission: RE | Admit: 2024-02-16 | Source: Ambulatory Visit | Admitting: Radiology

## 2024-02-26 ENCOUNTER — Ambulatory Visit (HOSPITAL_BASED_OUTPATIENT_CLINIC_OR_DEPARTMENT_OTHER)
Admission: RE | Admit: 2024-02-26 | Discharge: 2024-02-26 | Disposition: A | Discharge status: Home / Self Care | Source: Ambulatory Visit | Attending: Family Medicine | Admitting: Family Medicine

## 2024-02-26 ENCOUNTER — Encounter (HOSPITAL_BASED_OUTPATIENT_CLINIC_OR_DEPARTMENT_OTHER): Payer: Self-pay | Admitting: Radiology

## 2024-02-26 DIAGNOSIS — Z1231 Encounter for screening mammogram for malignant neoplasm of breast: Secondary | ICD-10-CM | POA: Insufficient documentation

## 2024-03-08 ENCOUNTER — Telehealth: Payer: Self-pay

## 2024-03-08 NOTE — Telephone Encounter (Signed)
 VOB submitted for Monovisc, left knee  She should be good.

## 2024-03-08 NOTE — Telephone Encounter (Signed)
 Patient's daughter called in regards to getting a gel injection in her L knee. Patient received one two yrs ago and she stated it really helped her.

## 2024-03-15 ENCOUNTER — Encounter: Payer: Self-pay | Admitting: Physician Assistant

## 2024-03-15 ENCOUNTER — Other Ambulatory Visit (INDEPENDENT_AMBULATORY_CARE_PROVIDER_SITE_OTHER): Payer: Self-pay

## 2024-03-15 ENCOUNTER — Other Ambulatory Visit: Payer: Self-pay

## 2024-03-15 ENCOUNTER — Ambulatory Visit (INDEPENDENT_AMBULATORY_CARE_PROVIDER_SITE_OTHER): Admitting: Physician Assistant

## 2024-03-15 DIAGNOSIS — M170 Bilateral primary osteoarthritis of knee: Secondary | ICD-10-CM

## 2024-03-15 MED ORDER — METHYLPREDNISOLONE ACETATE 40 MG/ML IJ SUSP
40.0000 mg | INTRAMUSCULAR | Status: AC | PRN
  Administered 2024-03-15: 40 mg via INTRA_ARTICULAR

## 2024-03-15 MED ORDER — LIDOCAINE HCL 1 % IJ SOLN
3.0000 mL | INTRAMUSCULAR | Status: AC | PRN
  Administered 2024-03-15: 3 mL

## 2024-03-15 MED ORDER — GABAPENTIN 100 MG PO CAPS: 100.0000 mg | ORAL_CAPSULE | Freq: Every day | ORAL | 3 refills | Status: DC

## 2024-03-15 NOTE — Progress Notes (Signed)
 Office Visit Note   Patient: Lynn [name]           Date of Birth: 06-19-1941           MRN: [id number] Visit Date: 03/15/2024              Requested by: Kennyth Worth HERO, MD [address] [place],  [place] [address] PCP: Kennyth Worth HERO, MD  Bilateral knee pain    HPI: Patient is a 83 year old woman who is accompanied by her daughter who does interpretation.  She has had knee injections a few years ago with Dr. Anderson.  They think they have had cortisone injections as well as gel injections.  I think the gel injections worked better wondering if they could be authorized for them.  Assessment & Plan: Visit Diagnoses:  1. Osteoarthritis of both knees, unspecified osteoarthritis type   2. Primary osteoarthritis of both knees     Plan: Will authorize for gel injections we will go forward with steroid injections today This patient is diagnosed with osteoarthritis of the knee(s).    Radiographs show evidence of joint space narrowing, osteophytes, subchondral sclerosis and/or subchondral cysts.  This patient has knee pain which interferes with functional and activities of daily living.    This patient has experienced inadequate response, adverse effects and/or intolerance with conservative treatments such as acetaminophen , NSAIDS, topical creams, physical therapy or regular exercise, knee bracing and/or weight loss.   This patient has experienced inadequate response or has a contraindication to intra articular steroid injections for at least 3 months.   This patient is not scheduled to have a total knee replacement within 6 months of starting treatment with viscosupplementation.   Follow-Up Instructions: No follow-ups on file.   Ortho Exam  Patient is alert, oriented, no adenopathy, well-dressed, normal affect, normal respiratory effort. Bilateral knees no effusion no erythema compartments are soft and compressible neurovascularly intact she does have some grinding with  range of motion no evidence of infection    Imaging: XR KNEE 3 VIEW RIGHT Result Date: 03/15/2024 Radiographs of the right knee demonstrate well-maintained alignment she does have some degenerative changes tricompartmentally  No images are attached to the encounter.  Labs: Lab Results  Component Value Date   HGBA1C 6.1 11/03/2023   HGBA1C 5.8 (H) 05/21/2023   HGBA1C 6.3 07/18/2022   ESRSEDRATE 5 07/19/2020   LABURIC 3.5 11/03/2023   LABURIC 3.5 07/19/2020     Lab Results  Component Value Date   ALBUMIN 3.9 11/03/2023   ALBUMIN 4.0 02/05/2023   ALBUMIN 4.1 09/02/2022    No results found for: MG Lab Results  Component Value Date   VD25OH 31.12 07/18/2022   VD25OH 49.6 07/19/2020   VD25OH 36 11/17/2018    No results found for: PREALBUMIN    Latest Ref Rng & Units 11/03/2023   10:08 AM 08/06/2023    2:28 PM 02/06/2023    5:49 AM  CBC EXTENDED  WBC 4.0 - 10.5 K/uL 6.8  8.7  6.5   RBC 3.87 - 5.11 Mil/uL 4.15  4.26  3.75   Hemoglobin 12.0 - 15.0 g/dL 87.6  87.5  88.8   HCT 36.0 - 46.0 % 37.0  38.3  32.9   Platelets 150.0 - 400.0 K/uL 225.0  332  245      There is no height or weight on file to calculate BMI.  Orders:  Orders Placed This Encounter  Procedures  . XR KNEE 3 VIEW RIGHT  . XR KNEE  3 VIEW LEFT   Meds ordered this encounter  Medications  . gabapentin  (NEURONTIN ) 100 MG capsule    Sig: Take 1 capsule (100 mg total) by mouth at bedtime.    Dispense:  30 capsule    Refill:  3     Procedures: Large Joint Inj: bilateral knee on 03/15/2024 4:21 PM Indications: pain and diagnostic evaluation Details: 25 G 1.5 in needle, anteromedial approach  Arthrogram: No  Medications (Right): 3 mL lidocaine  1 %; 40 mg methylPREDNISolone  acetate 40 MG/ML Medications (Left): 3 mL lidocaine  1 %; 40 mg methylPREDNISolone  acetate 40 MG/ML Outcome: tolerated well, no immediate complications Procedure, treatment alternatives, risks and benefits explained,  specific risks discussed. Consent was given by the patient.    Clinical Data: No additional findings.  ROS:  All other systems negative, except as noted in the HPI. Review of Systems  Objective: Vital Signs: There were no vitals taken for this visit.  Specialty Comments:  EXAM: MRI LUMBAR SPINE WITHOUT CONTRAST   TECHNIQUE: Multiplanar, multisequence MR imaging of the lumbar spine was performed. No intravenous contrast was administered.   COMPARISON:  Lumbar spine radiographs 01/27/2020   FINDINGS: Segmentation: The lowest fully formed intervertebral disc space is designated L5-S1. L5 is mildly transitional.   Alignment:  Trace retrolisthesis of L1 on L2 and L2 on L3.   Vertebrae: No fracture or suspicious osseous lesion. Scattered small Schmorl's nodes. Degenerative endplate changes throughout the lumbar spine including mild edema.   Conus medullaris and cauda equina: Conus extends to the L1 level. Conus and cauda equina appear normal.   Paraspinal and other soft tissues: Unremarkable.   Disc levels:   Disc desiccation throughout the lumbar spine. Severe disc space narrowing at L1-2 and L4-5 and mild narrowing at L2-3 and L3-4.   T11-12: Minimal disc bulging without stenosis.   T12-L1: Mild disc bulging without stenosis.   L1-2: Left eccentric disc bulging results in mild left neural foraminal stenosis without spinal stenosis.   L2-3: Disc bulging and a small superimposed left foraminal disc protrusion result in mild bilateral lateral recess stenosis and mild left neural foraminal stenosis without significant spinal stenosis.   L3-4: Disc bulging slightly eccentric to the right results in borderline lateral recess stenosis and mild right neural foraminal stenosis without spinal stenosis.   L4-5: Disc bulging, endplate spurring, severe disc space height loss, and mild facet and ligamentum flavum hypertrophy result in moderate to severe bilateral neural  foraminal stenosis with potential bilateral L4 nerve root impingement. No spinal stenosis.   L5-S1: Right eccentric disc bulging and mild right facet hypertrophy result in mild-to-moderate right neural foraminal stenosis. There is a small central disc protrusion without spinal stenosis.   IMPRESSION: 1. 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 2. Mild-to-moderate right neural foraminal stenosis at L5-S1. 3. No significant spinal stenosis.     Electronically Signed   By: Dasie Hamburg M.D.   On: 07/08/2020 15:20  PMFS History: Patient Active Problem List   Diagnosis Date Noted  . Weight loss 12/21/2023  . Cough due to ACE inhibitor 04/28/2023  . Anxiety 07/18/2022  . Leg edema 05/22/2022  . Low back pain 05/21/2022  . Osteoarthritis of knees, bilateral 05/28/2021  . Decreased appetite 03/07/2021  . Osteoarthritis 01/09/2021  . 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 Prediabetes 11/19/2019  . Hypertension 08/03/2019  . 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 Hyperlipidemia 03/01/2017   Past  Medical History:  Diagnosis Date  . Arthritis   . Bradycardia 03/01/2017  . Edema 03/01/2017  . 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 Hyperlipidemia 03/01/2017  . Hypertension   . Memory loss   . Mild cognitive disorder 06/14/2018    Family History  Problem Relation Age of Onset  . Diverticulitis Mother   . Diabetes Mother   . Heart attack Father   . Other Father        unsure  . Heart attack Sister   . Hypertension Sister   . Stroke Sister     History reviewed. No pertinent surgical history. Social History   Occupational History  . Occupation: Retired  Tobacco Use  . Smoking status: Never  . Smokeless tobacco: Never  Vaping Use  . Vaping status: Never Used  Substance and Sexual Activity  . Alcohol use: No  . Drug use: No  . Sexual activity: Not Currently

## 2024-04-07 ENCOUNTER — Other Ambulatory Visit: Payer: Self-pay | Admitting: Cardiovascular Disease

## 2024-04-12 ENCOUNTER — Ambulatory Visit: Admitting: Family Medicine

## 2024-04-19 ENCOUNTER — Other Ambulatory Visit: Payer: Self-pay

## 2024-04-19 DIAGNOSIS — G8929 Other chronic pain: Secondary | ICD-10-CM

## 2024-04-22 ENCOUNTER — Ambulatory Visit (HOSPITAL_BASED_OUTPATIENT_CLINIC_OR_DEPARTMENT_OTHER): Admitting: Physician Assistant

## 2024-04-28 ENCOUNTER — Encounter (HOSPITAL_BASED_OUTPATIENT_CLINIC_OR_DEPARTMENT_OTHER): Admitting: Family

## 2024-05-19 ENCOUNTER — Other Ambulatory Visit

## 2024-05-19 ENCOUNTER — Ambulatory Visit

## 2024-05-19 ENCOUNTER — Other Ambulatory Visit (INDEPENDENT_AMBULATORY_CARE_PROVIDER_SITE_OTHER)

## 2024-05-19 VITALS — BP 104/68 | HR 58 | Temp 98.8°F | Ht 60.0 in | Wt 121.0 lb

## 2024-05-19 DIAGNOSIS — E871 Hypo-osmolality and hyponatremia: Secondary | ICD-10-CM | POA: Diagnosis not present

## 2024-05-19 DIAGNOSIS — M1990 Unspecified osteoarthritis, unspecified site: Secondary | ICD-10-CM | POA: Diagnosis not present

## 2024-05-19 DIAGNOSIS — Z0000 Encounter for general adult medical examination without abnormal findings: Secondary | ICD-10-CM

## 2024-05-19 LAB — BASIC METABOLIC PANEL WITH GFR
BUN: 13 mg/dL (ref 6–23)
CO2: 30 meq/L (ref 19–32)
Calcium: 8.9 mg/dL (ref 8.4–10.5)
Chloride: 99 meq/L (ref 96–112)
Creatinine, Ser: 0.84 mg/dL (ref 0.40–1.20)
GFR: 64.22 mL/min (ref >60.00)
Glucose, Bld: 106 mg/dL — ABNORMAL HIGH (ref 70–99)
Potassium: 4.9 meq/L (ref 3.5–5.1)
Sodium: 135 meq/L (ref 135–145)

## 2024-05-19 MED ORDER — SPACER/AERO-HOLDING CHAMBERS DEVI: 0 refills | Status: AC

## 2024-05-19 NOTE — Addendum Note (Signed)
 Addended by: KENNYTH WORTH HERO on: 05/19/2024 12:45 PM   Modules accepted: Orders

## 2024-05-19 NOTE — Patient Instructions (Signed)
 Lynn [name] , Thank you for taking time out of your busy schedule to complete your Annual Wellness Visit with me. I enjoyed our conversation and look forward to speaking with you again next year. I, as well as your care team,  appreciate your ongoing commitment to your health goals. Please review the following plan we discussed and let me know if I can assist you in the future. Your Game plan/ To Do List    Referrals: If you haven't heard from the office you've been referred to, please reach out to them at the phone provided.   Follow up Visits: We will see or speak with you next year for your Next Medicare AWV with our clinical staff Have you seen your provider in the last 6 months (3 months if uncontrolled diabetes)? Yes  Clinician Recommendations:  Each day, aim for 6 glasses of water, plenty of protein in your diet and try to get up and walk/ stretch every hour for 5-10 minutes at a time.        This is a list of the screenings recommended for you:  Health Maintenance  Topic Date Due   COVID-19 Vaccine (5 - 2024-25 season) 05/24/2023   Flu Shot  04/22/2024   Medicare Annual Wellness Visit  05/19/2025   Pneumococcal Vaccine for age over 91  Completed   DEXA scan (bone density measurement)  Completed   Zoster (Shingles) Vaccine  Completed   HPV Vaccine  Aged Out   Meningitis B Vaccine  Aged Out   DTaP/Tdap/Td vaccine  Discontinued   Hepatitis C Screening  Discontinued    Advanced directives: (Provided) Advance directive discussed with you today. I have provided a copy for you to complete at home and have notarized. Once this is complete, please bring a copy in to our office so we can scan it into your chart.  Advance Care Planning is important because it:  [x]  Makes sure you receive the medical care that is consistent with your values, goals, and preferences  [x]  It provides guidance to your family and loved ones and reduces their decisional burden about whether or not  they are making the right decisions based on your wishes.  Follow the link provided in your after visit summary or read over the paperwork we have mailed to you to help you started getting your Advance Directives in place. If you need assistance in completing these, please reach out to us  so that we can help you!  See attachments for Preventive Care and Fall Prevention Tips.

## 2024-05-19 NOTE — Progress Notes (Addendum)
 Subjective:   Lynn [name] is a 83 y.o. who presents for a Medicare Wellness preventive visit.  As a reminder, Annual Wellness Visits don't include a physical exam, and some assessments may be limited, especially if this visit is performed virtually. We may recommend an in-person follow-up visit with your provider if needed.  Visit Complete: In person    Persons Participating in Visit: Patient assisted by [facility] Daughter .  AWV Questionnaire: No: Patient Medicare AWV questionnaire was not completed prior to this visit.  Cardiac Risk Factors include: advanced age (>54men, >27 women);dyslipidemia;hypertension     Objective:    Today's Vitals   05/19/24 1035 05/19/24 1107  BP: 104/68   Pulse: (!) 58   Temp: 98.8 F (37.1 C)   SpO2: 92%   Weight: 121 lb (54.9 kg)   Height: 5' (1.524 m)   PainSc:  7    Body mass index is 23.63 kg/m.     05/19/2024   11:17 AM 03/24/2023    2:52 PM 02/05/2023    3:55 PM 09/26/2022    2:45 PM 11/25/2018    3:55 PM  Advanced Directives  Does Patient Have a Medical Advance Directive? No Yes No No No   Type of Special educational needs teacher of [place];Living will     Copy of Healthcare Power of Attorney in Chart?  No - copy requested     Would patient like information on creating a medical advance directive? Yes (MAU/Ambulatory/Procedural Areas - Information given)  No - Patient declined No - Patient declined Yes (MAU/Ambulatory/Procedural Areas - Information given)      Data saved with a previous flowsheet row definition    Current Medications (verified) Outpatient Encounter Medications as of 05/19/2024  Medication Sig   albuterol (VENTOLIN HFA) 108 (90 Base) MCG/ACT inhaler SMARTSIG:2 Puff(s) By Mouth Every 4-6 Hours   aspirin  EC 81 MG tablet Take 81 mg by mouth every other day. Swallow whole.   atorvastatin  (LIPITOR) 10 MG tablet TAKE 1 TABLET (10 MG TOTAL) BY MOUTH DAILY AT 6 PM.   baclofen  (LIORESAL ) 10 MG tablet Take  0.5-1 tablets (5-10 mg total) by mouth 3 (three) times daily as needed for muscle spasms.   calcium  carbonate (OS-CAL - DOSED IN MG OF ELEMENTAL CALCIUM ) 1250 (500 Ca) MG tablet Take 1 tablet by mouth 3 (three) times a week.   diclofenac  Sodium (VOLTAREN ) 1 % GEL APPLY 4 G TOPICALLY 4 (FOUR) TIMES DAILY AS NEEDED.   gabapentin  (NEURONTIN ) 100 MG capsule Take 1 capsule (100 mg total) by mouth at bedtime.   hydrALAZINE  (APRESOLINE ) 50 MG tablet Take 1 tablet (50 mg total) by mouth every 12 (twelve) hours.   ibuprofen (ADVIL,MOTRIN) 200 MG tablet Take 200 mg by mouth every 6 (six) hours as needed.   losartan  (COZAAR ) 50 MG tablet Take 1 tablet (50 mg total) by mouth in the morning and at bedtime.   meclizine  (ANTIVERT ) 25 MG tablet Take 1 tablet (25 mg total) by mouth 3 (three) times daily as needed for dizziness.   metoprolol  tartrate (LOPRESSOR ) 25 MG tablet TAKE 1 TABLET BY MOUTH TWICE A DAY   Multiple Vitamin (MULTIVITAMIN) tablet Take 1 tablet by mouth 2 (two) times a week.   Pyridoxine HCl (VITAMIN B-6 PO) Take by mouth.   Thiamine HCl (VITAMIN B-1 PO) Take by mouth.   No facility-administered encounter medications on file as of 05/19/2024.    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 No pertinent surgical history. Family History  Problem Relation Age of Onset   Diverticulitis Mother    Diabetes Mother    Heart attack Father    Other Father        unsure   Heart attack Sister    Hypertension Sister    Stroke Sister    Social History   Socioeconomic History   Marital status: Widowed    Spouse name: Not on file   Number of children: 2   Years of education: some college   Highest education level: Not on  file  Occupational History   Occupation: Retired  Tobacco Use   Smoking status: Never   Smokeless tobacco: Never  Vaping Use   Vaping status: Never Used  Substance and Sexual Activity   Alcohol use: No   Drug use: No   Sexual activity: Not Currently  Other Topics Concern   Not on file  Social History Narrative   Pt lives with her daughter and her daughter's family (husband and 2 children) in 2 story home   Has 2 adult children   Some college education - however credits did not transfer to [place]    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; Warehouse manager work for office   Social Drivers of Corporate investment banker Strain: Low Risk  (05/19/2024)   Overall Financial Resource Strain (CARDIA)    Difficulty of Paying Living Expenses: Not hard at all  Food Insecurity: No Food Insecurity (05/19/2024)   Hunger Vital Sign    Worried About Running Out of Food in the Last Year: Never true    Ran Out of Food in the Last Year: Never true  Transportation Needs: No Transportation Needs (05/19/2024)   PRAPARE - Administrator, Civil Service (Medical): No    Lack of Transportation (Non-Medical): No  Physical Activity: Inactive (05/19/2024)   Exercise Vital Sign    Days of Exercise per Week: 0 days    Minutes of Exercise per Session: 0 min  Stress: No Stress Concern Present (05/19/2024)   [employer] of Occupational Health - Occupational Stress Questionnaire    Feeling of Stress: Not at all  Social Connections: Unknown (05/19/2024)   Social Connection and Isolation Panel    Frequency of Communication with Friends and Family: Twice a week    Frequency of Social Gatherings with Friends and Family: Not on file    Attends Religious Services: 1 to 4 times per year    Active Member of [employer] or Organizations: No    Attends Banker Meetings: Never    Marital Status: Widowed    Tobacco Counseling Counseling given: Not Answered    Clinical Intake:  Pre-visit preparation completed:  Yes  Pain : 0-10 Pain Score: 7  Pain Type: Chronic pain Pain Location: Back Pain Orientation: Lower Pain Descriptors / Indicators: Aching Pain Onset: More than a month ago Pain Frequency: Intermittent     BMI - recorded: 23.63 Nutritional Status: BMI of 19-24  Normal Diabetes: No  Lab Results  Component Value Date   HGBA1C 6.1 11/03/2023   HGBA1C 5.8 (H) 05/21/2023   HGBA1C 6.3 07/18/2022     How often do you need to have someone help you when you read instructions, pamphlets,  or other written materials from your doctor or pharmacy?: 5 - Always  Interpreter Needed?: Yes Interpreter Name: Bennye daughter  Information entered by :: Ellouise Haws, LPN   Activities of Daily Living     05/19/2024   11:09 AM  In your present state of health, do you have any difficulty performing the following activities:  Hearing? 1  Comment HOH  Vision? 0  Difficulty concentrating or making decisions? 1  Comment cognitive impairment  Walking or climbing stairs? 1  Dressing or bathing? 1  Comment asiistance  Doing errands, shopping? 1  Preparing Food and eating ? Y  Comment assistance  Using the Toilet? Y  Comment assistance  In the past six months, have you accidently leaked urine? Y  Comment at times when away from home  Do you have problems with loss of bowel control? N  Managing your Medications? Y  Comment assistance  Managing your Finances? Y  Comment assistance  Housekeeping or managing your Housekeeping? Y  Comment assistance    Patient Care Team: Kennyth Worth HERO, MD as PCP - General (Family Medicine) Jeffrie Oneil BROCKS, MD as PCP - Cardiology (Cardiology)  I have updated your Care Teams any recent Medical Services you may have received from other providers in the past year.     Assessment:   This is a routine wellness examination for [place].  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 aids  Vision Screening - Comments:: Wears rx  glasses - up to date with routine eye exams with [facility] and [facility]    Goals Addressed             This Visit's Progress    Patient Stated       Encourage more exercise        Depression Screen     05/19/2024   11:12 AM 11/03/2023    8:49 AM 03/24/2023    2:50 PM 10/29/2022   10:20 AM 10/29/2022   10:19 AM 07/18/2022   11:46 AM 05/22/2022   11:48 AM  PHQ 2/9 Scores  PHQ - 2 Score 0 0 1 0 0 0 0    Fall Risk     05/19/2024   11:16 AM 11/03/2023    8:49 AM 03/24/2023    2:52 PM 01/27/2023   10:35 AM 10/29/2022   10:20 AM  Fall Risk   Falls in the past year? 0 0 0 0 0  Number falls in past yr: 0 0 0 0 0  Injury with Fall? 0 0 0 0 0  Risk for fall due to : Impaired balance/gait;Impaired mobility No Fall Risks Impaired vision;Impaired balance/gait;Impaired mobility No Fall Risks No Fall Risks  Follow up Falls prevention discussed  Falls prevention discussed      MEDICARE RISK AT HOME:  Medicare Risk at Home Any stairs in or around the home?: Yes If so, are there any without handrails?: No Home free of loose throw rugs in walkways, pet beds, electrical cords, etc?: Yes Adequate lighting in your home to reduce risk of falls?: Yes Life alert?: Yes Use of a cane, walker or w/c?: Yes Grab bars in the bathroom?: Yes Shower chair or bench in shower?: Yes Elevated toilet seat or a handicapped toilet?: Yes  TIMED UP AND GO:  Was the test performed?  Yes  Length of time to ambulate 10 feet: 20 sec Gait unsteady without use of assistive device, provider informed and interventions were implemented  Cognitive  Function: Impaired: Patient has current diagnosis of cognitive impairment.    05/19/2024   11:18 AM 10/27/2018    9:00 AM 06/14/2018    9:45 AM 06/14/2018    9:41 AM 01/29/2018    2:00 PM  MMSE - Mini Mental State Exam  Not completed: Unable to complete      Orientation to time  2 5 5 5   Orientation to Place  2 4 4 5   Registration  3 3 3 3   Attention/ Calculation   0 5 5 5   Recall  2 2 2 3   Language- name 2 objects  2 2 2 1   Language- repeat  0 1 1 1   Language- follow 3 step command  1 3 3 3   Language- read & follow direction  1 1 1  --  Language-read & follow direction-comments     Unable to perform due to language deficit  Write a sentence  1 1 1 1   Copy design  0 1 1 1   Total score  14 28 28          Immunizations Immunization History  Administered Date(s) Administered   Fluad Quad(high Dose 65+) 07/28/2022   Influenza,inj,Quad PF,6+ Mos 05/12/2018, 07/01/2019, 07/19/2020   Influenza-Unspecified 06/21/2021   PFIZER(Purple Top)SARS-COV-2 Vaccination 11/05/2019, 11/30/2019, 09/07/2020, 06/05/2021   PNEUMOCOCCAL CONJUGATE-20 05/22/2022   Zoster Recombinant(Shingrix) 05/22/2022, 10/15/2022    Screening Tests Health Maintenance  Topic Date Due   COVID-19 Vaccine (5 - 2024-25 season) 05/24/2023   INFLUENZA VACCINE  04/22/2024   Medicare Annual Wellness (AWV)  05/19/2025   Pneumococcal Vaccine: 50+ Years  Completed   DEXA SCAN  Completed   Zoster Vaccines- Shingrix  Completed   HPV VACCINES  Aged Out   Meningococcal B Vaccine  Aged Out   DTaP/Tdap/Td  Discontinued   Hepatitis C Screening  Discontinued    Health Maintenance  Health Maintenance Due  Topic Date Due   COVID-19 Vaccine (5 - 2024-25 season) 05/24/2023   INFLUENZA VACCINE  04/22/2024   Health Maintenance Items Addressed: See Nurse Notes at the end of this note  Additional Screening:  Vision Screening: Recommended annual ophthalmology exams for early detection of glaucoma and other disorders of the eye. Would you like a referral to an eye doctor? No    Dental Screening: Recommended annual dental exams for proper oral hygiene  Community Resource Referral / Chronic Care Management: CRR required this visit?  No   CCM required this visit?  No   Plan:    I have personally reviewed and noted the following in the patient's chart:   Medical and social history Use  of alcohol, tobacco or illicit drugs  Current medications and supplements including opioid prescriptions. Patient is not currently taking opioid prescriptions. Functional ability and status Nutritional status Physical activity Advanced directives List of other physicians Hospitalizations, surgeries, and ER visits in previous 12 months Vitals Screenings to include cognitive, depression, and falls Referrals and appointments  In addition, I have reviewed and discussed with patient certain preventive protocols, quality metrics, and best practice recommendations. A written personalized care plan for preventive services as well as general preventive health recommendations were provided to patient.   Ellouise VEAR Haws, LPN   [id number]   After Visit Summary: (In Person-Printed) AVS printed and given to the patient  Notes: Please refer to Routing Comments.

## 2024-05-20 ENCOUNTER — Ambulatory Visit: Payer: Self-pay | Admitting: Family Medicine

## 2024-05-20 NOTE — Progress Notes (Signed)
 Sodium is back to normal.  We can recheck at next office visit.

## 2024-05-26 ENCOUNTER — Telehealth: Payer: Self-pay | Admitting: Physical Medicine and Rehabilitation

## 2024-05-26 DIAGNOSIS — M47816 Spondylosis without myelopathy or radiculopathy, lumbar region: Secondary | ICD-10-CM

## 2024-05-26 NOTE — Telephone Encounter (Signed)
 Patient's daughter called. Mom would like to see Dr. Eldonna

## 2024-06-10 ENCOUNTER — Ambulatory Visit

## 2024-06-17 ENCOUNTER — Encounter (HOSPITAL_BASED_OUTPATIENT_CLINIC_OR_DEPARTMENT_OTHER): Payer: Self-pay

## 2024-06-20 ENCOUNTER — Encounter (HOSPITAL_BASED_OUTPATIENT_CLINIC_OR_DEPARTMENT_OTHER): Payer: Self-pay | Admitting: Family

## 2024-06-20 ENCOUNTER — Ambulatory Visit (INDEPENDENT_AMBULATORY_CARE_PROVIDER_SITE_OTHER): Admitting: Family

## 2024-06-20 VITALS — BP 120/52 | HR 68 | Ht <= 58 in | Wt 123.0 lb

## 2024-06-20 DIAGNOSIS — I10 Essential (primary) hypertension: Secondary | ICD-10-CM

## 2024-06-20 DIAGNOSIS — I2510 Atherosclerotic heart disease of native coronary artery without angina pectoris: Secondary | ICD-10-CM | POA: Diagnosis not present

## 2024-06-20 DIAGNOSIS — E785 Hyperlipidemia, unspecified: Secondary | ICD-10-CM

## 2024-06-20 NOTE — Progress Notes (Signed)
 [facility] Assessment:    Date:  06/20/2024   ID:  Lynn [name], DOB [birth year], MRN [id number]  PCP:  Lynn Worth HERO, MD  Cardiologist:  Lynn Parchment, MD  Nephrologist:  Referring MD: Lynn Worth HERO, MD   CC: Hypertension  History of Present Illness:    Lynn [name] is a 83 y.o. female with a hx of hypertension, prediabetes, moderate cognitive impairment, hyperlipidemia, coronary calcification on CT scan here to follow up in the [facility].   Established with [facility] 01/20/23 after referral from Lynn [name].  Initially on metoprolol , losartan .  HCTZ 12.5 mg later added and then later discontinued.  It was noted 1 point family members were giving her ACE and ARB because they felt it was better controlling her blood pressure than what the doctor had prescribed.  Anxiety has been contributory to labile BP.  ED visit 09/26/2022 for hyponatremia and lightheadedness with BP 139/53.  MRI negative for stroke.  Renal Dopplers 09/2022 normal flow bilaterally.  Initial advanced hypertension clinic visit 10/01/2022 BP uncontrolled and also orthostatic.  BP increased from 178/82 laying to 205/84 standing.  She had been taking higher doses of ARB's and recommended (losartan  100 mg twice daily).  Given she preferred 80s she was recommended to switch to lisinopril  40 mg daily.  Losartan  and Catapres stopped.  She was given 25 mg hydralazine  to take as needed for SBP greater than 150.  Spironolactone  12.5 mg daily was started.  Admitted 5/16/5/18/24 with hyponatremia NA 121 provided with IVF.  Metoprolol  was held due to bradycardia.  Pharmacy visit 02/13/2023 BP in clinic 123/76 on his transition to hydralazine  25 mg twice daily.  Spironolactone  have been discontinued due to hyperkalemia.  At pharmacy visit 02/26/2023 BP was mildly elevated.  Lisinopril  40 mg daily, metoprolol  titrate 25 mg twice daily, try dose of 25 mg twice daily  continue with additional 25 mg of hydralazine  for SBP greater than 150.  Seen 06/04/23 she noted fogginess and dizziness for 2 month with overall unclear history. Subsequent carotid duplex with no stenosis. Symptoms improved.  At visit 08/06/23 BP controlled in clinic but elevated at home. She was advised to continue current regimen and bring BP cuff to follow up.   She saw Lynn [name] 12/31/23 with SOB and chest heaviness x 3-4 mos. echo 01/22/2024 normal LVEF 65 to 70%, severe LAE, moderate MR, mild to moderate AI.  Myoview  02/09/2024 low risk stress test with no ischemia nor infarction.  There was coronary calcification recommended continue low-dose aspirin  and statin.  She saw Dr. Raford in [facility] 02/11/24.  Her blood pressure was improved but not at goal less than 130/80.  Hydralazine  increased to 50 mg twice daily.  Medication timing adjusted to 10 AM 10P.  Presents today for follow up with her daughter who assists with history taking anc interpreting, declines additional interpretor. Reports infrequent right sided chest sharp discomfort. Reports her breathing is occasionally short of breath. Planning to go to [employer] to walk later today for exercise. Reports BP on average 140/62 when checked prior to her medications. Notes her initial SBP may be 150-160 then improves without intervention on recheck ~5 minutes later to 140. Reports she feels well on her present regimen. Did not bring BP cuff for review.   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 No pertinent surgical  history.  Current Medications: Current Meds  Medication Sig   aspirin  EC 81 MG tablet Take 81 mg by mouth every other day. Swallow whole.   atorvastatin  (LIPITOR) 10 MG tablet TAKE 1 TABLET (10 MG TOTAL) BY MOUTH DAILY AT 6 PM.   baclofen  (LIORESAL ) 10 MG tablet Take 0.5-1 tablets (5-10 mg total) by mouth 3 (three) times daily as needed for muscle spasms.   calcium  carbonate (OS-CAL - DOSED IN MG OF ELEMENTAL CALCIUM ) 1250 (500 Ca) MG tablet Take 1 tablet by mouth 3 (three) times a week.   diclofenac  Sodium (VOLTAREN ) 1 % GEL APPLY 4 G TOPICALLY 4 (FOUR) TIMES DAILY AS NEEDED.   gabapentin  (NEURONTIN ) 100 MG capsule Take 1 capsule (100 mg total) by mouth at bedtime.   hydrALAZINE  (APRESOLINE ) 50 MG tablet Take 1 tablet (50 mg total) by mouth every 12 (twelve) hours.   ibuprofen (ADVIL,MOTRIN) 200 MG tablet Take 200 mg by mouth every 6 (six) hours as needed.   losartan  (COZAAR ) 50 MG tablet Take 1 tablet (50 mg total) by mouth in the morning and at bedtime.   meclizine  (ANTIVERT ) 25 MG tablet Take 1 tablet (25 mg total) by mouth 3 (three) times daily as needed for dizziness.   metoprolol  tartrate (LOPRESSOR ) 25 MG tablet TAKE 1 TABLET BY MOUTH TWICE A DAY   Multiple Vitamin (MULTIVITAMIN) tablet Take 1 tablet by mouth 2 (two) times a week.   Nutritional Supplements (BRAIN SUPPORT PO) Take by mouth daily.   Pyridoxine HCl (VITAMIN B-6 PO) Take by mouth.   Thiamine HCl (VITAMIN B-1 PO) Take by mouth.     Allergies:   Norvasc  [amlodipine ] and Olmesartan    Social History   Socioeconomic History   Marital status: Widowed    Spouse name: Not on file   Number of children: 2   Years of education: some college   Highest education level: Not on file  Occupational History   Occupation: Retired  Tobacco Use   Smoking status: Never   Smokeless tobacco: Never  Vaping Use   Vaping status: Never Used  Substance and Sexual Activity   Alcohol use: No   Drug use: No   Sexual activity: Not  Currently  Other Topics Concern   Not on file  Social History Narrative   Pt lives with her daughter and her daughter's family (husband and 2 children) in 2 story home   Has 2 adult children   Some college education - however credits did not transfer to [place]    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; Warehouse manager work for office   Social Drivers of Corporate investment banker Strain: Low Risk  (05/19/2024)   Overall Financial Resource Strain (CARDIA)    Difficulty of Paying Living Expenses: Not hard at all  Food Insecurity: No Food Insecurity (05/19/2024)   Hunger Vital Sign    Worried About Running Out of Food in the Last Year: Never true    Ran Out of Food in the Last Year: Never true  Transportation Needs: No Transportation Needs (05/19/2024)   PRAPARE - Administrator, Civil Service (Medical): No    Lack of Transportation (Non-Medical): No  Physical Activity: Inactive (05/19/2024)   Exercise Vital Sign    Days of Exercise per Week:  0 days    Minutes of Exercise per Session: 0 min  Stress: No Stress Concern Present (05/19/2024)   [employer] of Occupational Health - Occupational Stress Questionnaire    Feeling of Stress: Not at all  Social Connections: Unknown (05/19/2024)   Social Connection and Isolation Panel    Frequency of Communication with Friends and Family: Twice a week    Frequency of Social Gatherings with Friends and Family: Not on file    Attends Religious Services: 1 to 4 times per year    Active Member of [employer] or Organizations: No    Attends Banker Meetings: Never    Marital Status: Widowed     Family History: The patient's family history includes Diabetes in her mother; Diverticulitis in her mother; Heart attack in her father and sister; Hypertension in her sister; Other in her father; Stroke in her sister.  ROS:   Please see the history of present illness.     All other systems reviewed and are negative.  EKGs/Labs/Other Studies Reviewed:          Recent Labs: 08/06/2023: BNP 99.3 11/03/2023: ALT 20; Hemoglobin 12.3; Platelets 225.0; TSH 3.32 05/19/2024: BUN 13; Creatinine, Ser 0.84; Potassium 4.9; Sodium 135   Recent Lipid Panel    Component Value Date/Time   CHOL 127 11/03/2023 1008   CHOL 139 05/21/2023 1319   TRIG 63.0 11/03/2023 1008   HDL 54.10 11/03/2023 1008   HDL 60 05/21/2023 1319   CHOLHDL 2 11/03/2023 1008   VLDL 12.6 11/03/2023 1008   LDLCALC 60 11/03/2023 1008   LDLCALC 61 05/21/2023 1319   Cardiac Studies & Procedures   ______________________________________________________________________________________________   STRESS TESTS  MYOCARDIAL PERFUSION IMAGING 02/09/2024  Interpretation Summary   The study is normal. The study is low risk.   No ST deviation was noted.   LV perfusion is normal. There is no evidence of ischemia. There is no evidence of infarction.   Left ventricular function is normal. Nuclear stress EF: 80%. The left ventricular ejection fraction is hyperdynamic (>65%). End diastolic cavity size is normal. End systolic cavity size is normal. No evidence of transient ischemic dilation (TID) noted.   CT images were obtained for attenuation correction and were examined for the presence of coronary calcium  when appropriate.   Coronary calcium  was present on the attenuation correction CT images. Moderate coronary calcifications were present. Coronary calcifications were present in the left anterior descending artery, left circumflex artery and right coronary artery distribution(s).   Prior study available for comparison from 09/06/2020. No changes compared to prior study.   ECHOCARDIOGRAM  ECHOCARDIOGRAM COMPLETE 02/09/2024  Narrative ECHOCARDIOGRAM REPORT    Patient Name:   Lynn [name] Date of Exam: 02/09/2024 Medical Rec #:  [id number]                 Height:       57.0 in Accession #:    [id number]                Weight:       118.5 lb Date of Birth:  [birth year]                  BSA:          1.440 m Patient Age:    83 years                  BP:           138/62 mmHg Patient Gender:  F                         HR:           66 bpm. Exam Location:  [address]  Procedure: 2D Echo, 3D Echo, Cardiac Doppler and Color Doppler (Both Spectral and Color Flow Doppler were utilized during procedure).  Indications:    R06.00 Dyspnea  History:        Patient has no prior history of Echocardiogram examinations. Arrythmias:Bradycardia, Signs/Symptoms:Shortness of Breath, Chest Pain and Edema; Risk Factors:Family History of Coronary Artery Disease, Hypertension and Dyslipidemia.  Sonographer:    Heather Hawks RDCS Referring Phys: Lynn JAYSON [name]  IMPRESSIONS   1. Noted basal septal thickness ( 1.35 cm). Left ventricular ejection fraction, by estimation, is 65 to 70%. The left ventricle has normal function. The left ventricle has no regional wall motion abnormalities. Left ventricular diastolic function could not be evaluated. 2. Right ventricular systolic function is normal. The right ventricular size is normal. 3. Left atrial size was severely dilated. 4. Right atrial size was mildly dilated. 5. The mitral valve is normal in structure. Moderate mitral valve regurgitation. No evidence of mitral stenosis. 6. Tricuspid valve regurgitation is moderate. 7. The aortic valve is normal in structure. Aortic valve regurgitation is mild to moderate. No aortic stenosis is present. 8. The inferior vena cava is normal in size with greater than 50% respiratory variability, suggesting right atrial pressure of 3 mmHg.  FINDINGS Left Ventricle: Noted basal septal thickness ( 1.35 cm). Left ventricular ejection fraction, by estimation, is 65 to 70%. The left ventricle has normal function. The left ventricle has no regional wall motion abnormalities. The left ventricular internal cavity size was normal in size. There is no left ventricular hypertrophy. Left ventricular diastolic  function could not be evaluated due to mitral regurgitation (moderate or greater). Left ventricular diastolic function could not be evaluated.  Right Ventricle: The right ventricular size is normal. No increase in right ventricular wall thickness. Right ventricular systolic function is normal.  Left Atrium: Left atrial size was severely dilated.  Right Atrium: Right atrial size was mildly dilated.  Pericardium: There is no evidence of pericardial effusion.  Mitral Valve: The mitral valve is normal in structure. Moderate mitral valve regurgitation. No evidence of mitral valve stenosis.  Tricuspid Valve: The tricuspid valve is normal in structure. Tricuspid valve regurgitation is moderate . No evidence of tricuspid stenosis.  Aortic Valve: The aortic valve is normal in structure. Aortic valve regurgitation is mild to moderate. Aortic regurgitation PHT measures 518 msec. No aortic stenosis is present.  Pulmonic Valve: The pulmonic valve was normal in structure. Pulmonic valve regurgitation is mild to moderate. No evidence of pulmonic stenosis.  Aorta: The aortic root is normal in size and structure.  Venous: The inferior vena cava is normal in size with greater than 50% respiratory variability, suggesting right atrial pressure of 3 mmHg.  IAS/Shunts: No atrial level shunt detected by color flow Doppler.  Additional Comments: 3D was performed not requiring image post processing on an independent workstation and was normal.   LEFT VENTRICLE PLAX 2D LVIDd:         4.50 cm   Diastology LVIDs:         2.50 cm   LV e' medial:    9.36 cm/s LV PW:         0.80 cm   LV E/e' medial:  12.0 LV IVS:  0.70 cm   LV e' lateral:   9.68 cm/s LVOT diam:     2.10 cm   LV E/e' lateral: 11.6 LV SV:         90 LV SV Index:   62 LVOT Area:     3.46 cm  3D Volume EF: 3D EF:        71 % LV EDV:       95 ml LV ESV:       28 ml LV SV:        67 ml  RIGHT VENTRICLE RV Basal diam:  3.40 cm RV S  prime:     12.90 cm/s TAPSE (M-mode): 1.9 cm RVSP:           48.4 mmHg  LEFT ATRIUM             Index        RIGHT ATRIUM           Index LA diam:        4.30 cm 2.99 cm/m   RA Pressure: 8.00 mmHg LA Vol (A2C):   85.0 ml 59.03 ml/m  RA Area:     15.40 cm LA Vol (A4C):   60.8 ml 42.22 ml/m  RA Volume:   40.60 ml  28.20 ml/m LA Biplane Vol: 76.2 ml 52.92 ml/m AORTIC VALVE LVOT Vmax:   105.50 cm/s LVOT Vmean:  68.050 cm/s LVOT VTI:    0.260 m AI PHT:      518 msec  AORTA Ao Root diam: 2.90 cm Ao Asc diam:  3.30 cm  MITRAL VALVE                 TRICUSPID VALVE MV Area (PHT)  cm           TR Peak grad:   40.4 mmHg MV Decel Time: 157 msec      TR Vmax:        318.00 cm/s MR Peak grad:   143.8 mmHg   Estimated RAP:  8.00 mmHg MR Mean grad:   97.5 mmHg    RVSP:           48.4 mmHg MR Vmax:        599.50 cm/s MR Vmean:       468.0 cm/s   SHUNTS MR PISA:        4.02 cm     Systemic VTI:  0.26 m MR PISA Radius: 0.80 cm      Systemic Diam: 2.10 cm MV E velocity: 112.00 cm/s MV A velocity: 66.55 cm/s MV E/A ratio:  1.68  Kardie Tobb DO Electronically signed by Dub Huntsman DO Signature Date/Time: 02/09/2024/4:52:20 PM    Final          ______________________________________________________________________________________________      Physical Exam:   VS:  BP (!) 120/52   Pulse 68   Ht 4' 9 (1.448 m)   Wt 123 lb (55.8 kg)   BMI 26.62 kg/m  , BMI Body mass index is 26.62 kg/m. GENERAL:  Well appearing HEENT: Pupils equal round and reactive, fundi not visualized, oral mucosa unremarkable NECK:  No jugular venous distention, waveform within normal limits, carotid upstroke brisk and symmetric, no bruits, no thyromegaly LYMPHATICS:  No cervical adenopathy LUNGS:  Clear to auscultation bilaterally HEART:  RRR.  PMI not displaced or sustained,S1 and S2 within normal limits, no S3, no S4, no clicks, no rubs, no murmurs ABD:  Flat, positive bowel sounds normal in  frequency in pitch, no bruits, no rebound, no guarding, no midline pulsatile mass, no hepatomegaly, no splenomegaly EXT:  2 plus pulses throughout, no edema, no cyanosis no clubbing SKIN:  No rashes no nodules NEURO:  Cranial nerves II through XII grossly intact, motor grossly intact throughout PSYCH:  Cognitively intact, oriented to person place and time   ASSESSMENT/PLAN:    HTN - Reports BP at home most often 140/62. BP in clinic very well controlled. Continue current medication regimen Hydralazine  50mg  BID, Losartan  50mg  BID, Lopressor  25mg  BID. Discussed to monitor BP at home at least 2 hours after medications and sitting for 5-10 minutes.  Recommend aiming for 150 minutes of moderate intensity activity per week and following a heart healthy diet.   Bring BP cuff to next clinic visit.   Coronary calcification on CT/HLD, LDL goal less than 70-Myoview  01/2024 low risk study. 10/2023 LDL 60. Stable with no anginal symptoms. No indication for ischemic evaluation.   Continue aspirin  81 mg daily, atorvastatin  10 mg daily, Lopressor  25 mg twice daily.  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  Consider repeat echo 01/2025, can be coordinated at follow up. Continue optimal BP control, as abive.   Screening for Secondary Hypertension:     01/20/2023    4:13 PM  Causes  Drugs/Herbals Screened     - Comments No tobacco,no EtOH, limits salt  Renovascular HTN N/A  Sleep Apnea Screened     - Comments no snoring  Thyroid  Disease Screened    Relevant Labs/Studies:    Latest Ref Rng & Units 05/19/2024   10:49 AM 11/03/2023   10:08 AM 08/06/2023    2:28 PM  Basic Labs  Sodium 135 - 145 mEq/L 135  132  138   Potassium 3.5 - 5.1 mEq/L 4.9  4.2  4.6   Creatinine 0.40 - 1.20 mg/dL 9.15  9.25  9.02        Latest Ref Rng & Units 11/03/2023   10:08 AM 02/05/2023    9:23 PM  Thyroid    TSH 0.35 - 5.50 uIU/mL 3.32  2.632        Latest  Ref Rng & Units 02/02/2023    9:44 AM  Renin/Aldosterone   Aldosterone 0.0 - 30.0 ng/dL 74.7   Aldos/Renin Ratio 0.0 - 30.0 23.3        Latest Ref Rng & Units 02/02/2023    9:44 AM  Metanephrines/Catecholamines   Epinephrine 0 - 62 pg/mL 49   Norepinephrine 0 - 874 pg/mL 758   Dopamine 0 - 48 pg/mL <30   Metanephrines 0.0 - 88.0 pg/mL <25.0   Normetanephrines  0.0 - 297.2 pg/mL 52.3           09/26/2022   11:02 AM  Renovascular   Renal Artery US  Completed Yes       Disposition:    FU with MD/PharmD/APP in 6 months. Follow up with general cardiology Dr. Jeffrie in 9 months   Medication Adjustments/Labs and Tests Ordered: Current medicines are reviewed at length with the patient today.  Concerns regarding medicines are outlined above.  No orders of the defined types were placed in this encounter.  No orders of the defined types were placed in this encounter.    Signed, Reche GORMAN Finder, NP  06/20/2024 10:44 AM    [facility]

## 2024-06-20 NOTE — Patient Instructions (Signed)
 Medication Instructions:   Your physician recommends that you continue on your current medications as directed. Please refer to the Current Medication list given to you today.    Labwork:  NONE   Testing/Procedures:  NONE   Follow-Up:  1.)  Please follow up in _6_ months in [facility] with Dr. Raford, Reche Finder, NP or Allean Mink PharmD   2.)  9 MONTHS WITH DR. JEFFRIE

## 2024-06-22 ENCOUNTER — Other Ambulatory Visit: Payer: Self-pay

## 2024-06-22 ENCOUNTER — Ambulatory Visit (INDEPENDENT_AMBULATORY_CARE_PROVIDER_SITE_OTHER): Admitting: Physical Medicine and Rehabilitation

## 2024-06-22 VITALS — BP 190/80

## 2024-06-22 DIAGNOSIS — M47816 Spondylosis without myelopathy or radiculopathy, lumbar region: Secondary | ICD-10-CM

## 2024-06-22 DIAGNOSIS — M8580 Other specified disorders of bone density and structure, unspecified site: Secondary | ICD-10-CM

## 2024-06-22 MED ORDER — METHYLPREDNISOLONE ACETATE 80 MG/ML IJ SUSP
40.0000 mg | Freq: Once | INTRAMUSCULAR | Status: AC
  Administered 2024-06-22: 40 mg

## 2024-06-22 NOTE — Progress Notes (Signed)
 Pain Scale   Average Pain 7 Patient advising she has chronic lower back pain and pain is constant. Patient advised to follow up with PCP if B/P continues to be elevated        +Driver, -BT, -Dye Allergies.

## 2024-06-28 ENCOUNTER — Ambulatory Visit: Admitting: Physician Assistant

## 2024-06-28 ENCOUNTER — Other Ambulatory Visit: Payer: Self-pay | Admitting: Cardiovascular Disease

## 2024-06-28 DIAGNOSIS — M170 Bilateral primary osteoarthritis of knee: Secondary | ICD-10-CM

## 2024-06-28 DIAGNOSIS — M1712 Unilateral primary osteoarthritis, left knee: Secondary | ICD-10-CM

## 2024-06-28 MED ORDER — CROSS-LINK HYAL ACID (VISC) 30 MG/3ML IX PRSY
30.0000 mg | PREFILLED_SYRINGE | INTRA_ARTICULAR | Status: DC | PRN
  Administered 2024-06-28: 30 mg via INTRA_ARTICULAR

## 2024-06-28 NOTE — Progress Notes (Signed)
 Office Visit Note   Patient: Lynn [name]           Date of Birth: [birth year]           MRN: [id number] Visit Date: 06/28/2024              Requested by: Kennyth Worth HERO, MD [address] [place],  [place] [address] PCP: Kennyth Worth HERO, MD  No chief complaint on file.     HPI: Patient is a pleasant 83 year old woman who comes in periodically for gel or  injections.  She has osteoarthritis bilateral knees.  Here for Monovisc injection left knee today.  Assessment & Plan: Visit Diagnoses:  1. Primary osteoarthritis of both knees     Plan: Go forward with Monovisc injection May follow-up as needed  Follow-Up Instructions: No follow-ups on file.   Ortho Exam  Patient is alert, oriented, no adenopathy, well-dressed, normal affect, normal respiratory effort. Left no effusion no erythema compartments are soft and compressible gait knee stickers    Imaging: No results found. No images are attached to the encounter.  Labs: Lab Results  Component Value Date   HGBA1C 6.1 11/03/2023   HGBA1C 5.8 (H) 05/21/2023   HGBA1C 6.3 07/18/2022   ESRSEDRATE 5 07/19/2020   LABURIC 3.5 11/03/2023   LABURIC 3.5 07/19/2020     Lab Results  Component Value Date   ALBUMIN 3.9 11/03/2023   ALBUMIN 4.0 02/05/2023   ALBUMIN 4.1 09/02/2022    No results found for: MG Lab Results  Component Value Date   VD25OH 31.12 07/18/2022   VD25OH 49.6 07/19/2020   VD25OH 36 11/17/2018    No results found for: PREALBUMIN    Latest Ref Rng & Units 11/03/2023   10:08 AM 08/06/2023    2:28 PM 02/06/2023    5:49 AM  CBC EXTENDED  WBC 4.0 - 10.5 K/uL 6.8  8.7  6.5   RBC 3.87 - 5.11 Mil/uL 4.15  4.26  3.75   Hemoglobin 12.0 - 15.0 g/dL 87.6  87.5  88.8   HCT 36.0 - 46.0 % 37.0  38.3  32.9   Platelets 150.0 - 400.0 K/uL 225.0  332  245      There is no height or weight on file to calculate BMI.  Orders:  No orders of the defined types were placed in this encounter.  No  orders of the defined types were placed in this encounter.    Procedures: Large Joint Inj: L knee on 06/28/2024 4:02 PM Indications: pain and diagnostic evaluation Details: 22 G 1.5 in needle, anteromedial approach  Arthrogram: No  Medications: 88 mg Hyaluronan 88 MG/4ML Outcome: tolerated well, no immediate complications Procedure, treatment alternatives, risks and benefits explained, specific risks discussed. Consent was given by the patient.      Clinical Data: No additional findings.  ROS:  All other systems negative, except as noted in the HPI. Review of Systems  Objective: Vital Signs: There were no vitals taken for this visit.  Specialty Comments:  EXAM: MRI LUMBAR SPINE WITHOUT CONTRAST   TECHNIQUE: Multiplanar, multisequence MR imaging of the lumbar spine was performed. No intravenous contrast was administered.   COMPARISON:  Lumbar spine radiographs 01/27/2020   FINDINGS: Segmentation: The lowest fully formed intervertebral disc space is designated L5-S1. L5 is mildly transitional.   Alignment:  Trace retrolisthesis of L1 on L2 and L2 on L3.   Vertebrae: No fracture or suspicious osseous lesion. Scattered small Schmorl's nodes. Degenerative endplate changes throughout the  lumbar spine including mild edema.   Conus medullaris and cauda equina: Conus extends to the L1 level. Conus and cauda equina appear normal.   Paraspinal and other soft tissues: Unremarkable.   Disc levels:   Disc desiccation throughout the lumbar spine. Severe disc space narrowing at L1-2 and L4-5 and mild narrowing at L2-3 and L3-4.   T11-12: Minimal disc bulging without stenosis.   T12-L1: Mild disc bulging without stenosis.   L1-2: Left eccentric disc bulging results in mild left neural foraminal stenosis without spinal stenosis.   L2-3: Disc bulging and a small superimposed left foraminal disc protrusion result in mild bilateral lateral recess stenosis and mild left  neural foraminal stenosis without significant spinal stenosis.   L3-4: Disc bulging slightly eccentric to the right results in borderline lateral recess stenosis and mild right neural foraminal stenosis without spinal stenosis.   L4-5: Disc bulging, endplate spurring, severe disc space height loss, and mild facet and ligamentum flavum hypertrophy result in moderate to severe bilateral neural foraminal stenosis with potential bilateral L4 nerve root impingement. No spinal stenosis.   L5-S1: Right eccentric disc bulging and mild right facet hypertrophy result in mild-to-moderate right neural foraminal stenosis. There is a small central disc protrusion without spinal stenosis.   IMPRESSION: 1. 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 2. Mild-to-moderate right neural foraminal stenosis at L5-S1. 3. No significant spinal stenosis.     Electronically Signed   By: Dasie Hamburg M.D.   On: 07/08/2020 15:20  PMFS History: Patient Active Problem List   Diagnosis Date Noted  . Weight loss 12/21/2023  . Cough due to ACE inhibitor 04/28/2023  . Anxiety 07/18/2022  . Leg edema 05/22/2022  . Low back pain 05/21/2022  . Osteoarthritis of knees, bilateral 05/28/2021  . Decreased appetite 03/07/2021  . Osteoarthritis 01/09/2021  . 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 Prediabetes 11/19/2019  . Hypertension 08/03/2019  . 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 Hyperlipidemia 03/01/2017   Past Medical History:  Diagnosis Date  . Arthritis   . Bradycardia 03/01/2017  . Edema 03/01/2017  . 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 Hyperlipidemia 03/01/2017  . Hypertension   . Memory loss   . Mild cognitive disorder 06/14/2018    Family History  Problem Relation Age of Onset  . Diverticulitis Mother   . Diabetes Mother   . Heart attack Father   . Other Father        unsure  . Heart  attack Sister   . Hypertension Sister   . Stroke Sister     No past surgical history on file. Social History   Occupational History  . Occupation: Retired  Tobacco Use  . Smoking status: Never  . Smokeless tobacco: Never  Vaping Use  . Vaping status: Never Used  Substance and Sexual Activity  . Alcohol use: No  . Drug use: No  . Sexual activity: Not Currently

## 2024-06-30 MED ORDER — HYALURONAN 88 MG/4ML IX SOSY
88.0000 mg | PREFILLED_SYRINGE | INTRA_ARTICULAR | Status: AC | PRN
  Administered 2024-06-28: 88 mg via INTRA_ARTICULAR

## 2024-07-03 NOTE — Procedures (Signed)
 Lumbar Facet Joint Intra-Articular Injection(s) with Fluoroscopic Guidance  Patient: Lynn [name]      Date of Birth: [birth year] MRN: [id number] PCP: Kennyth Worth HERO, MD      Visit Date: 06/22/2024   Universal Protocol:    Date/Time: 06/22/2024  Consent Given By: the patient  Position: PRONE   Additional Comments: Vital signs were monitored before and after the procedure. Patient was prepped and draped in the usual sterile fashion. The correct patient, procedure, and site was verified.   Injection Procedure Details:  Procedure Site One Meds Administered:  Meds ordered this encounter  Medications   methylPREDNISolone  acetate (DEPO-MEDROL ) injection 40 mg     Laterality: Bilateral  Location/Site:  L4-L5  Needle size: 22 guage  Needle type: Spinal  Needle Placement: Articular  Findings:  -Comments: Excellent flow of contrast producing a partial arthrogram.  Procedure Details: The fluoroscope beam is vertically oriented in AP, and the inferior recess is visualized beneath the lower pole of the inferior apophyseal process, which represents the target point for needle insertion. When direct visualization is difficult the target point is located at the medial projection of the vertebral pedicle. The region overlying each aforementioned target is locally anesthetized with a 1 to 2 ml. volume of 1% Lidocaine  without Epinephrine.   The spinal needle was inserted into each of the above mentioned facet joints using biplanar fluoroscopic guidance. A 0.25 to 0.5 ml. volume of Isovue-250 was injected and a partial facet joint arthrogram was obtained. A single spot film was obtained of the resulting arthrogram.    One to 1.25 ml of the steroid/anesthetic solution was then injected into each of the facet joints noted above.   Additional Comments:  The patient tolerated the procedure well Dressing: 2 x 2 sterile gauze and Band-Aid    Post-procedure details: Patient was  observed during the procedure. Post-procedure instructions were reviewed.  Patient left the clinic in stable condition.

## 2024-07-03 NOTE — Progress Notes (Signed)
 Lynn [name] - 83 y.o. female MRN [id number]  Date of birth: [birth year]  Office Visit Note: Visit Date: 06/22/2024 PCP: Kennyth Worth HERO, MD Referred by: Kennyth Worth HERO, MD  Subjective: Chief Complaint  Patient presents with   Lower Back - Pain   HPI:  Lynn [name] is a 83 y.o. female who comes in today at the request of Duwaine Pouch, FNP for planned Bilateral  L4-5 Lumbar facet/medial branch block with fluoroscopic guidance.  The patient has failed conservative care including home exercise, medications, time and activity modification.  This injection will be diagnostic and hopefully therapeutic.  Please see requesting physician notes for further details and justification.  Exam has shown concordant pain with facet joint loading.   ROS Otherwise per HPI.  Assessment & Plan: Visit Diagnoses:    ICD-10-CM   1. Spondylosis without myelopathy or radiculopathy, lumbar region  M47.816 XR C-ARM NO REPORT    Facet Injection    methylPREDNISolone  acetate (DEPO-MEDROL ) injection 40 mg    2. Osteopenia, unspecified location  M85.80 Ambulatory referral to Orthopedic Surgery      Plan: No additional findings.   Meds & Orders:  Meds ordered this encounter  Medications   methylPREDNISolone  acetate (DEPO-MEDROL ) injection 40 mg    Orders Placed This Encounter  Procedures   Facet Injection   XR C-ARM NO REPORT   Ambulatory referral to Orthopedic Surgery    Follow-up: No follow-ups on file.   Procedures: No procedures performed  Lumbar Facet Joint Intra-Articular Injection(s) with Fluoroscopic Guidance  Patient: Lynn [name]      Date of Birth: Aug 17, 1941 MRN: [id number] PCP: Kennyth Worth HERO, MD      Visit Date: 06/22/2024   Universal Protocol:    Date/Time: 06/22/2024  Consent Given By: the patient  Position: PRONE   Additional Comments: Vital signs were monitored before and after the procedure. Patient was prepped and draped in the  usual sterile fashion. The correct patient, procedure, and site was verified.   Injection Procedure Details:  Procedure Site One Meds Administered:  Meds ordered this encounter  Medications   methylPREDNISolone  acetate (DEPO-MEDROL ) injection 40 mg     Laterality: Bilateral  Location/Site:  L4-L5  Needle size: 22 guage  Needle type: Spinal  Needle Placement: Articular  Findings:  -Comments: Excellent flow of contrast producing a partial arthrogram.  Procedure Details: The fluoroscope beam is vertically oriented in AP, and the inferior recess is visualized beneath the lower pole of the inferior apophyseal process, which represents the target point for needle insertion. When direct visualization is difficult the target point is located at the medial projection of the vertebral pedicle. The region overlying each aforementioned target is locally anesthetized with a 1 to 2 ml. volume of 1% Lidocaine  without Epinephrine.   The spinal needle was inserted into each of the above mentioned facet joints using biplanar fluoroscopic guidance. A 0.25 to 0.5 ml. volume of Isovue-250 was injected and a partial facet joint arthrogram was obtained. A single spot film was obtained of the resulting arthrogram.    One to 1.25 ml of the steroid/anesthetic solution was then injected into each of the facet joints noted above.   Additional Comments:  The patient tolerated the procedure well Dressing: 2 x 2 sterile gauze and Band-Aid    Post-procedure details: Patient was observed during the procedure. Post-procedure instructions were reviewed.  Patient left the clinic in stable condition.    Clinical History: EXAM: MRI LUMBAR SPINE WITHOUT CONTRAST   TECHNIQUE:  Multiplanar, multisequence MR imaging of the lumbar spine was performed. No intravenous contrast was administered.   COMPARISON:  Lumbar spine radiographs 01/27/2020   FINDINGS: Segmentation: The lowest fully formed  intervertebral disc space is designated L5-S1. L5 is mildly transitional.   Alignment:  Trace retrolisthesis of L1 on L2 and L2 on L3.   Vertebrae: No fracture or suspicious osseous lesion. Scattered small Schmorl's nodes. Degenerative endplate changes throughout the lumbar spine including mild edema.   Conus medullaris and cauda equina: Conus extends to the L1 level. Conus and cauda equina appear normal.   Paraspinal and other soft tissues: Unremarkable.   Disc levels:   Disc desiccation throughout the lumbar spine. Severe disc space narrowing at L1-2 and L4-5 and mild narrowing at L2-3 and L3-4.   T11-12: Minimal disc bulging without stenosis.   T12-L1: Mild disc bulging without stenosis.   L1-2: Left eccentric disc bulging results in mild left neural foraminal stenosis without spinal stenosis.   L2-3: Disc bulging and a small superimposed left foraminal disc protrusion result in mild bilateral lateral recess stenosis and mild left neural foraminal stenosis without significant spinal stenosis.   L3-4: Disc bulging slightly eccentric to the right results in borderline lateral recess stenosis and mild right neural foraminal stenosis without spinal stenosis.   L4-5: Disc bulging, endplate spurring, severe disc space height loss, and mild facet and ligamentum flavum hypertrophy result in moderate to severe bilateral neural foraminal stenosis with potential bilateral L4 nerve root impingement. No spinal stenosis.   L5-S1: Right eccentric disc bulging and mild right facet hypertrophy result in mild-to-moderate right neural foraminal stenosis. There is a small central disc protrusion without spinal stenosis.   IMPRESSION: 1. 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 2. Mild-to-moderate right neural foraminal stenosis at L5-S1. 3. No significant spinal stenosis.     Electronically Signed   By:  Dasie Hamburg M.D.   On: 07/08/2020 15:20     Objective:  VS:  HT:    WT:   BMI:     BP:(!) 190/80  HR: bpm  TEMP: ( )  RESP:  Physical Exam Vitals and nursing note reviewed.  Constitutional:      General: She is not in acute distress.    Appearance: Normal appearance. She is not ill-appearing.  HENT:     Head: Normocephalic and atraumatic.     Right Ear: External ear normal.     Left Ear: External ear normal.  Eyes:     Extraocular Movements: Extraocular movements intact.  Cardiovascular:     Rate and Rhythm: Normal rate.     Pulses: Normal pulses.  Pulmonary:     Effort: Pulmonary effort is normal. No respiratory distress.  Abdominal:     General: There is no distension.     Palpations: Abdomen is soft.  Musculoskeletal:        General: Tenderness present.     Cervical back: Neck supple.     Right lower leg: No edema.     Left lower leg: No edema.     Comments: Patient has good distal strength with no pain over the greater trochanters.  No clonus or focal weakness.  Skin:    Findings: No erythema, lesion or rash.  Neurological:     General: No focal deficit present.     Mental Status: She is alert and oriented to person, place, and time.     Sensory: No sensory  deficit.     Motor: No weakness or abnormal muscle tone.     Coordination: Coordination normal.  Psychiatric:        Mood and Affect: Mood normal.        Behavior: Behavior normal.      Imaging: No results found.

## 2024-07-07 ENCOUNTER — Encounter (HOSPITAL_BASED_OUTPATIENT_CLINIC_OR_DEPARTMENT_OTHER): Admitting: Family

## 2024-07-11 ENCOUNTER — Ambulatory Visit (INDEPENDENT_AMBULATORY_CARE_PROVIDER_SITE_OTHER): Admitting: Physician Assistant

## 2024-07-11 ENCOUNTER — Encounter: Payer: Self-pay | Admitting: Physician Assistant

## 2024-07-11 ENCOUNTER — Other Ambulatory Visit: Payer: Self-pay

## 2024-07-11 VITALS — Ht <= 58 in | Wt 121.6 lb

## 2024-07-11 DIAGNOSIS — M810 Age-related osteoporosis without current pathological fracture: Secondary | ICD-10-CM | POA: Diagnosis not present

## 2024-07-11 NOTE — Addendum Note (Signed)
 Addended by: MYRNA VEVA HERO on: 07/11/2024 04:49 PM   Modules accepted: Orders

## 2024-07-11 NOTE — Progress Notes (Signed)
 Office Visit Note   Patient: Lynn [name]           Date of Birth: 07/12/1941           MRN: [id number] Visit Date: 07/11/2024              Requested by: Eldonna Novel, MD [address] [place],  [place] [address] PCP: Kennyth Worth HERO, MD   Assessment & Plan: Visit Diagnoses:  1. Age-related osteoporosis without current pathological fracture     Plan: Patient is a pleasant 83 year old woman who is accompanied by her daughter who interprets for her.  She was referred by Dr. Eldonna.  She thinks long ago she may have taken a medication for osteoporosis but takes nothing currently.  She has no current history of fracture or heart attack or coronary disease no history of cancer no history of kidney disease no history of GI issues or reflux.  No history of epilepsy.  She went through menopause at age 35 but never did hormone replacement therapy .  She does take calcium  intermittently but her daughter admits she has difficulty getting her to take it consistently.  She also takes vitamin D  every day.  She does not eat drink alcoholic beverages or really exercise.  She has had no real major dental work.  No history of fracture in her parents her last bone density scan was -2.5 in 2019.  Based on that information her FRAX score is at about 19% for major osteoporotic fracture but 14% for hip fracture.  We talked extensively about vitamin D  and calcium .  I gave them a food list and asked her daughter to track her calcium  intake it just it so it is at the 12 to 1500 mg/day.  Gave him information about how to incorporate resistance training into exercise for them.  I spent 45 minutes discussing medication side effects and outcomes and calculating FRAX score and also reviewing her chart.  I do think she should be on Prolia would be a good choice for her.  Specifically reviewed the risks with this we will go forward and try and authorize for Prolia.  Also will order new bone density scan.  When she  comes in if she starts Prolia treatment we will have to update labs  Follow-Up Instructions: No follow-ups on file.   Orders:  No orders of the defined types were placed in this encounter.  No orders of the defined types were placed in this encounter.     Procedures: No procedures performed   Clinical Data: No additional findings.   Subjective:   HPI patient is a pleasant 83 year old woman who is accompanied by her family referred from Dr. Eldonna for osteoporosis.  Her daughter accompanies her on all her doctors visits and acts as her interpreter  Review of Systems  All other systems reviewed and are negative.    Objective: Vital Signs: There were no vitals taken for this visit.  Physical Exam Constitutional:      Appearance: Normal appearance.  Pulmonary:     Effort: Pulmonary effort is normal.  Skin:    General: Skin is warm and dry.  Neurological:     General: No focal deficit present.     Mental Status: She is alert and oriented to person, place, and time.  Psychiatric:        Mood and Affect: Mood normal.        Behavior: Behavior normal.       Specialty Comments:  EXAM: MRI LUMBAR SPINE WITHOUT CONTRAST   TECHNIQUE: Multiplanar, multisequence MR imaging of the lumbar spine was performed. No intravenous contrast was administered.   COMPARISON:  Lumbar spine radiographs 01/27/2020   FINDINGS: Segmentation: The lowest fully formed intervertebral disc space is designated L5-S1. L5 is mildly transitional.   Alignment:  Trace retrolisthesis of L1 on L2 and L2 on L3.   Vertebrae: No fracture or suspicious osseous lesion. Scattered small Schmorl's nodes. Degenerative endplate changes throughout the lumbar spine including mild edema.   Conus medullaris and cauda equina: Conus extends to the L1 level. Conus and cauda equina appear normal.   Paraspinal and other soft tissues: Unremarkable.   Disc levels:   Disc desiccation throughout the lumbar  spine. Severe disc space narrowing at L1-2 and L4-5 and mild narrowing at L2-3 and L3-4.   T11-12: Minimal disc bulging without stenosis.   T12-L1: Mild disc bulging without stenosis.   L1-2: Left eccentric disc bulging results in mild left neural foraminal stenosis without spinal stenosis.   L2-3: Disc bulging and a small superimposed left foraminal disc protrusion result in mild bilateral lateral recess stenosis and mild left neural foraminal stenosis without significant spinal stenosis.   L3-4: Disc bulging slightly eccentric to the right results in borderline lateral recess stenosis and mild right neural foraminal stenosis without spinal stenosis.   L4-5: Disc bulging, endplate spurring, severe disc space height loss, and mild facet and ligamentum flavum hypertrophy result in moderate to severe bilateral neural foraminal stenosis with potential bilateral L4 nerve root impingement. No spinal stenosis.   L5-S1: Right eccentric disc bulging and mild right facet hypertrophy result in mild-to-moderate right neural foraminal stenosis. There is a small central disc protrusion without spinal stenosis.   IMPRESSION: 1. 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 2. Mild-to-moderate right neural foraminal stenosis at L5-S1. 3. No significant spinal stenosis.     Electronically Signed   By: Dasie Hamburg M.D.   On: 07/08/2020 15:20  Imaging: No results found.   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 No pertinent surgical history. Social History   Occupational History   Occupation: Retired  Tobacco Use   Smoking status: Never   Smokeless tobacco: Never  Vaping Use   Vaping status: Never Used  Substance and Sexual Activity   Alcohol use: No  Drug use: No   Sexual activity: Not Currently

## 2024-07-12 ENCOUNTER — Ambulatory Visit (INDEPENDENT_AMBULATORY_CARE_PROVIDER_SITE_OTHER): Admitting: Family Medicine

## 2024-07-12 ENCOUNTER — Ambulatory Visit: Payer: Self-pay

## 2024-07-12 VITALS — BP 120/60 | HR 68 | Temp 97.0°F | Ht <= 58 in | Wt 120.6 lb

## 2024-07-12 DIAGNOSIS — I10 Essential (primary) hypertension: Secondary | ICD-10-CM | POA: Diagnosis not present

## 2024-07-12 DIAGNOSIS — R7303 Prediabetes: Secondary | ICD-10-CM | POA: Diagnosis not present

## 2024-07-12 DIAGNOSIS — E785 Hyperlipidemia, unspecified: Secondary | ICD-10-CM | POA: Diagnosis not present

## 2024-07-12 DIAGNOSIS — Z23 Encounter for immunization: Secondary | ICD-10-CM | POA: Diagnosis not present

## 2024-07-12 DIAGNOSIS — R21 Rash and other nonspecific skin eruption: Secondary | ICD-10-CM

## 2024-07-12 MED ORDER — TRIAMCINOLONE ACETONIDE 0.5 % EX OINT
1.0000 | TOPICAL_OINTMENT | Freq: Two times a day (BID) | CUTANEOUS | 0 refills | Status: AC
Start: 2024-07-12 — End: ?

## 2024-07-12 NOTE — Patient Instructions (Signed)
 It was very nice to see you today!  I am glad the rash is better.  Please take a picture if it comes back.  You can use the triamcinolone if needed.  You can also use Claritin or Benadryl.  I will see you back in the spring for your annual physical.  We can check labs at that time.  Return in about 4 months (around 11/12/2024).   Take care, Dr Kennyth  PLEASE NOTE:  If you had any lab tests, please let us  know if you have not heard back within a few days. You may see your results on mychart before we have a chance to review them but we will give you a call once they are reviewed by us .   If we ordered any referrals today, please let us  know if you have not heard from their office within the next week.   If you had any urgent prescriptions sent in today, please check with the pharmacy within an hour of our visit to make sure the prescription was transmitted appropriately.   Please try these tips to maintain a healthy lifestyle:  Eat at least 3 REAL meals and 1-2 snacks per day.  Aim for no more than 5 hours between eating.  If you eat breakfast, please do so within one hour of getting up.   Each meal should contain half fruits/vegetables, one quarter protein, and one quarter carbs (no bigger than a computer mouse)  Cut down on sweet beverages. This includes juice, soda, and sweet tea.   Drink at least 1 glass of water with each meal and aim for at least 8 glasses per day  Exercise at least 150 minutes every week.

## 2024-07-12 NOTE — Assessment & Plan Note (Signed)
 At goal today on current regimen per cardiology with losartan  50 milligrams twice daily, hydralazine  50 mg twice daily and metoprolol  to tartrate 25 mg twice daily

## 2024-07-12 NOTE — Progress Notes (Signed)
   Lynn [name] is a 83 y.o. female who presents today for an office visit.  Assessment/Plan:  New/Acute Problems: Rash  No red flags.  Reassuring that symptoms have improved dramatically since yesterday.  May have had small allergic reaction to environmental exposure.  Advised him to take a picture next time that she has a recurrence.  Did discuss importance of emollients.  Will send in triamcinolone to use as needed.  Also recommended over-the-counter antihistamine such as Claritin or Zyrtec if she has a recurrence.  We discussed reasons to return to care.  Follow-up as needed.  Chronic Problems Addressed Today: Hypertension At goal today on current regimen per cardiology with losartan  50 milligrams twice daily, hydralazine  50 mg twice daily and metoprolol  to tartrate 25 mg twice daily  Prediabetes Will check A1c when she comes back in for blood work.  Hyperlipidemia Check lipids when she comes back in for labs at [facility] in a few months.  Flu shot given today.    Subjective:  HPI:  See assessment / plan for status of chronic conditions.  Patient is here with her daughter today who helps provide the history.  Primary concern is rash.  Started 1 week ago.  Located on abdomen.  Patient alerted her daughter this yesterday.  Area was very red and pruritic.  No new recent exposures.  No soaps, detergents lotions or any other new products.  No specific treatments tried.  Symptoms have improved dramatically since yesterday.  Only has a small amount of irritation to the area.         Objective:  Physical Exam: BP 120/60   Pulse 68   Temp (!) 97 F (36.1 C) (Temporal)   Ht 4' 10 (1.473 m)   Wt 120 lb 9.6 oz (54.7 kg)   SpO2 99%   BMI 25.21 kg/m   Gen: No acute distress, resting comfortably Skin: Xerosis cutis noted on abdomen.  Faintly erythematous rash with a few scattered lesions on right lower abdomen Neuro: Grossly normal, moves all extremities Psych: Normal affect  and thought content      Marijo Quizon M. Kennyth, MD 07/12/2024 12:06 PM

## 2024-07-12 NOTE — Telephone Encounter (Signed)
 FYI Only or Action Required?: FYI only for provider.  Patient was last seen in primary care on 11/03/2023 by Kennyth Worth HERO, MD.  Called Nurse Triage reporting Rash.  Symptoms began several weeks ago.  Interventions attempted: Nothing.  Symptoms are: stable.  Triage Disposition: See PCP When Office is Open (Within 3 Days)  Patient/caregiver understands and will follow disposition?: Yes  Reason for Disposition  Localized rash present > 7 days  Answer Assessment - Initial Assessment Questions Patient's daughter calling back in. Denies trying new foods, or laundry detergent or skin products. Denies starting new medications. Has not tried any OTC creams   1. APPEARANCE of RASH: What does the rash look like? (e.g., blisters, dry flaky skin, red spots, redness, sores)     Red, small bumps  2. LOCATION: Where is the rash located?      Right side of abdomen  3. NUMBER: How many spots are there?      Daughter cannot remember  4. SIZE: How big are the spots? (e.g., inches, cm; or compare to size of pinhead, tip of pen, eraser, pea)      Small   5. ONSET: When did the rash start?      4 days ago  6. ITCHING: Does the rash itch? If Yes, ask: How bad is the itch?  (Scale 0-10; or none, mild, moderate, severe)     Mild to moderate  7. PAIN: Does the rash hurt? If Yes, ask: How bad is the pain?  (Scale 0-10; or none, mild, moderate, severe)     Denies  8. OTHER SYMPTOMS: Do you have any other symptoms? (e.g., fever)     Denies  Protocols used: Rash or Redness - Localized-A-AH  Message from [place] H sent at 07/12/2024  8:11 AM EDT  Reason for Triage: Patient daughter called in stated the patient is experiencing the following symptoms:  Rash on right side of stomach Itching - Rash initially began 7 days ago Requesting a callback to set up appointment. Daughter Callback Number: [phone]  Transf. To NT

## 2024-07-12 NOTE — Assessment & Plan Note (Signed)
 Will check A1c when she comes back in for blood work.

## 2024-07-12 NOTE — Assessment & Plan Note (Signed)
 Check lipids when she comes back in for labs at [facility] in a few months.

## 2024-07-13 NOTE — Telephone Encounter (Signed)
 Please call pt and schedule acute visit in office

## 2024-07-13 NOTE — Telephone Encounter (Signed)
 Pt does not want to schedule. States rash is getting better.

## 2024-07-19 ENCOUNTER — Other Ambulatory Visit: Payer: Self-pay | Admitting: Physician Assistant

## 2024-07-19 DIAGNOSIS — M810 Age-related osteoporosis without current pathological fracture: Secondary | ICD-10-CM

## 2024-07-19 MED ORDER — DENOSUMAB 60 MG/ML ~~LOC~~ SOSY
60.0000 mg | PREFILLED_SYRINGE | Freq: Once | SUBCUTANEOUS | Status: AC
  Administered 2024-09-01: 60 mg via SUBCUTANEOUS

## 2024-07-22 ENCOUNTER — Other Ambulatory Visit: Payer: Self-pay | Admitting: Radiology

## 2024-07-22 MED ORDER — DENOSUMAB 60 MG/ML ~~LOC~~ SOSY
60.0000 mg | PREFILLED_SYRINGE | Freq: Once | SUBCUTANEOUS | 0 refills | Status: AC
Start: 2024-07-22 — End: 2024-07-22

## 2024-07-22 NOTE — Progress Notes (Signed)
 Rx Prolia sent to [facility].

## 2024-07-25 ENCOUNTER — Encounter: Payer: Self-pay | Admitting: Radiology

## 2024-09-01 ENCOUNTER — Ambulatory Visit: Admitting: Physician Assistant

## 2024-09-01 DIAGNOSIS — M810 Age-related osteoporosis without current pathological fracture: Secondary | ICD-10-CM

## 2024-09-01 NOTE — Progress Notes (Signed)
 Office Visit Note   Patient: Lynn [name]           Date of Birth: February 22, 1941           MRN: [id number] Visit Date: 09/01/2024              Requested by: Kennyth Worth HERO, MD [address] [place],  [place] [address] PCP: Kennyth Worth HERO, MD  Chief Complaint  Patient presents with   Osteoporosis    Prolia  Injection      HPI: Patient is a pleasant 83 year old woman who is accompanied by her daughter-in-law comes in today for her first Prolia  injection.  Assessment & Plan: Visit Diagnoses:  1. Age-related osteoporosis without current pathological fracture     Plan: Medication administered without difficulty follow-up in 6 months and 1 day reviewed side effects with her daughter-in-law  Follow-Up Instructions: 6 months 1 day  Ortho Exam  Patient is alert, oriented, no adenopathy, well-dressed, normal affect, normal respiratory effort.     Imaging: No results found. No images are attached to the encounter.  Labs: Lab Results  Component Value Date   HGBA1C 6.1 11/03/2023   HGBA1C 5.8 (H) 05/21/2023   HGBA1C 6.3 07/18/2022   ESRSEDRATE 5 07/19/2020   LABURIC 3.5 11/03/2023   LABURIC 3.5 07/19/2020     Lab Results  Component Value Date   ALBUMIN 3.9 11/03/2023   ALBUMIN 4.0 02/05/2023   ALBUMIN 4.1 09/02/2022    No results found for: MG Lab Results  Component Value Date   VD25OH 31.12 07/18/2022   VD25OH 49.6 07/19/2020   VD25OH 36 11/17/2018    No results found for: PREALBUMIN    Latest Ref Rng & Units 11/03/2023   10:08 AM 08/06/2023    2:28 PM 02/06/2023    5:49 AM  CBC EXTENDED  WBC 4.0 - 10.5 K/uL 6.8  8.7  6.5   RBC 3.87 - 5.11 Mil/uL 4.15  4.26  3.75   Hemoglobin 12.0 - 15.0 g/dL 87.6  87.5  88.8   HCT 36.0 - 46.0 % 37.0  38.3  32.9   Platelets 150.0 - 400.0 K/uL 225.0  332  245      There is no height or weight on file to calculate BMI.  Orders:  No orders of the defined types were placed in this encounter.  No  orders of the defined types were placed in this encounter.    Procedures: No procedures performed  Clinical Data: No additional findings.  ROS:  All other systems negative, except as noted in the HPI. Review of Systems  Objective: Vital Signs: There were no vitals taken for this visit.  Specialty Comments:  EXAM: MRI LUMBAR SPINE WITHOUT CONTRAST   TECHNIQUE: Multiplanar, multisequence MR imaging of the lumbar spine was performed. No intravenous contrast was administered.   COMPARISON:  Lumbar spine radiographs 01/27/2020   FINDINGS: Segmentation: The lowest fully formed intervertebral disc space is designated L5-S1. L5 is mildly transitional.   Alignment:  Trace retrolisthesis of L1 on L2 and L2 on L3.   Vertebrae: No fracture or suspicious osseous lesion. Scattered small Schmorl's nodes. Degenerative endplate changes throughout the lumbar spine including mild edema.   Conus medullaris and cauda equina: Conus extends to the L1 level. Conus and cauda equina appear normal.   Paraspinal and other soft tissues: Unremarkable.   Disc levels:   Disc desiccation throughout the lumbar spine. Severe disc space narrowing at L1-2 and L4-5 and mild narrowing at L2-3 and  L3-4.   T11-12: Minimal disc bulging without stenosis.   T12-L1: Mild disc bulging without stenosis.   L1-2: Left eccentric disc bulging results in mild left neural foraminal stenosis without spinal stenosis.   L2-3: Disc bulging and a small superimposed left foraminal disc protrusion result in mild bilateral lateral recess stenosis and mild left neural foraminal stenosis without significant spinal stenosis.   L3-4: Disc bulging slightly eccentric to the right results in borderline lateral recess stenosis and mild right neural foraminal stenosis without spinal stenosis.   L4-5: Disc bulging, endplate spurring, severe disc space height loss, and mild facet and ligamentum flavum hypertrophy result  in moderate to severe bilateral neural foraminal stenosis with potential bilateral L4 nerve root impingement. No spinal stenosis.   L5-S1: Right eccentric disc bulging and mild right facet hypertrophy result in mild-to-moderate right neural foraminal stenosis. There is a small central disc protrusion without spinal stenosis.   IMPRESSION: 1. 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 2. Mild-to-moderate right neural foraminal stenosis at L5-S1. 3. No significant spinal stenosis.     Electronically Signed   By: Dasie Hamburg M.D.   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 Social History   Occupational History   Occupation: Retired  Tobacco Use   Smoking status: Never   Smokeless tobacco: Never  Vaping Use   Vaping status: Never Used  Substance and Sexual Activity   Alcohol  use: No   Drug use: No   Sexual activity: Not Currently

## 2024-10-04 ENCOUNTER — Ambulatory Visit

## 2024-10-04 ENCOUNTER — Other Ambulatory Visit: Payer: Self-pay

## 2024-10-04 DIAGNOSIS — M810 Age-related osteoporosis without current pathological fracture: Secondary | ICD-10-CM

## 2024-10-04 NOTE — Progress Notes (Signed)
 PT seen for calcium  lab draw

## 2024-10-05 LAB — CALCIUM: Calcium: 9.1 mg/dL (ref 8.6–10.4)

## 2024-10-20 ENCOUNTER — Encounter: Payer: Self-pay | Admitting: Family

## 2024-10-20 ENCOUNTER — Ambulatory Visit: Payer: Self-pay

## 2024-10-20 ENCOUNTER — Ambulatory Visit: Admitting: Family

## 2024-10-20 VITALS — BP 118/60 | HR 65 | Temp 97.4°F | Ht <= 58 in | Wt 123.0 lb

## 2024-10-20 DIAGNOSIS — H65195 Other acute nonsuppurative otitis media, recurrent, left ear: Secondary | ICD-10-CM

## 2024-10-20 DIAGNOSIS — J029 Acute pharyngitis, unspecified: Secondary | ICD-10-CM

## 2024-10-20 LAB — POCT INFLUENZA A/B
Influenza A, POC: NEGATIVE
Influenza B, POC: NEGATIVE

## 2024-10-20 LAB — POC COVID19 BINAXNOW: SARS Coronavirus 2 Ag: NEGATIVE

## 2024-10-20 LAB — POCT RAPID STREP A (OFFICE): Rapid Strep A Screen: POSITIVE — AB

## 2024-10-20 MED ORDER — AMOXICILLIN 400 MG/5ML PO SUSR: 1000.0000 mg | Freq: Two times a day (BID) | ORAL | 0 refills | Status: DC

## 2024-10-20 MED ORDER — AMOXICILLIN 500 MG PO CAPS: 1000.0000 mg | ORAL_CAPSULE | Freq: Two times a day (BID) | ORAL | 0 refills | Status: DC

## 2024-10-20 NOTE — Telephone Encounter (Signed)
 FYI Only or Action Required?: FYI only for provider: appointment scheduled on 1/29.  Patient was last seen in primary care on 07/12/2024 by Kennyth Worth HERO, MD.  Called Nurse Triage reporting Sore Throat.  Symptoms began x 3 days.  Interventions attempted: OTC medications: Tylenol  and Advil..  Symptoms are: unchanged.  Triage Disposition: See PCP When Office is Open (Within 3 Days)  Patient/caregiver understands and will follow disposition?: Yes        Message from [place] R sent at 10/20/2024  8:32 AM EST  Reason for Triage: Patient states for the last three days her throat has been itchy and painful, also has a runny nose. Has noticied a change in her voice   Reason for Disposition  [1] Sore throat with cough/cold symptoms AND [2] present > 5 days  Answer Assessment - Initial Assessment Questions 1. ONSET: When did the throat start hurting? (Hours or days ago)      X 3 days  2. SEVERITY: How bad is the sore throat? (Scale 1-10; mild, moderate or severe)     Mild   3. STREP EXPOSURE: Has there been any exposure to strep within the past week? If Yes, ask: What type of contact occurred?      No   4.  VIRAL SYMPTOMS: Are there any symptoms of a cold, such as a runny nose, cough, hoarse voice or red eyes?      Runny nose, hoarse   5. FEVER: Do you have a fever? If Yes, ask: What is your temperature, how was it measured, and when did it start?     No   6. PUS ON THE TONSILS: Is there pus on the tonsils in the back of your throat?     No   7. OTHER SYMPTOMS: Do you have any other symptoms? (e.g., difficulty breathing, headache, rash)     No      Patient's daughter called in to triage with complaints of sore throat with itchiness in the patient. This has been ongoing for 3 days.  The patient is having runny nose and hoarse voice.  For home care, the patient is taking Tylenol  and Advil.   Appointment scheduled for further evaluation; Patient  agrees with the plan of care, and will reach out if symptoms worsen or persist.  Protocols used: Sore Throat-A-AH

## 2024-10-20 NOTE — Telephone Encounter (Signed)
 Appt today

## 2024-10-20 NOTE — Progress Notes (Signed)
 "  Patient ID: Lynn [name], female    DOB: [birth year], 84 y.o.   MRN: [id number]  Chief Complaint  Patient presents with   Sore Throat    Pt c/o runny nose, headache and sore/itchy throat, present for 2 days.   *Due to language barrier, patient's daughter was present during the history-taking and subsequent discussion (and for all of the physical exam) with this patient.  HPI: Patient complains of sore throat, runny nose and headache for last 2 days. Denies any fever, body aches or cough. She has not taken any OTC meds for her symptoms.  ASSESSMENT & PLAN:  1. Sore throat (Primary) Rapid flu & covid testing negative. Rapid strep test with only a very fine line. Sending AMOX as she also has ear infection, advised can take Tylenol  prn for pain, warm salt water gargles 2--3 times per day. Daughter is requesting a liquid medication also if pt is unable to swallow the pills.  - POCT rapid strep A - POC COVID-19 - POCT Influenza A/B - amoxicillin  (AMOXIL ) 500 MG capsule; Take 2 capsules (1,000 mg total) by mouth 2 (two) times daily for 7 days.  Dispense: 28 capsule; Refill: 0 - amoxicillin  (AMOXIL ) 400 MG/5ML suspension; Take 12.5 mLs (1,000 mg total) by mouth 2 (two) times daily for 7 days.  Dispense: 175 mL; Refill: 0  2. 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  Dispense: 28 capsule; Refill: 0 - amoxicillin  (AMOXIL ) 400 MG/5ML suspension; Take 12.5 mLs (1,000 mg total) by mouth 2 (two) times daily for 7 days.  Dispense: 175 mL; Refill: 0   Subjective:    Outpatient Medications Prior to Visit  Medication Sig Dispense Refill   albuterol (VENTOLIN HFA) 108 (90 Base) MCG/ACT inhaler SMARTSIG:2 Puff(s) By Mouth Every 4-6 Hours (Patient not taking: Reported on 06/20/2024)     aspirin  EC 81 MG tablet Take 81 mg by mouth every other day. Swallow  whole.     atorvastatin  (LIPITOR) 10 MG tablet TAKE 1 TABLET (10 MG TOTAL) BY MOUTH DAILY AT 6 PM. 90 tablet 3   baclofen  (LIORESAL ) 10 MG tablet Take 0.5-1 tablets (5-10 mg total) by mouth 3 (three) times daily as needed for muscle spasms. 30 each 3   calcium  carbonate (OS-CAL - DOSED IN MG OF ELEMENTAL CALCIUM ) 1250 (500 Ca) MG tablet Take 1 tablet by mouth 3 (three) times a week.     diclofenac  Sodium (VOLTAREN ) 1 % GEL APPLY 4 G TOPICALLY 4 (FOUR) TIMES DAILY AS NEEDED. 500 g 3   gabapentin  (NEURONTIN ) 100 MG capsule Take 1 capsule (100 mg total) by mouth at bedtime. 30 capsule 3   hydrALAZINE  (APRESOLINE ) 50 MG tablet Take 1 tablet (50 mg total) by mouth every 12 (twelve) hours. 180 tablet 3   ibuprofen (ADVIL,MOTRIN) 200 MG tablet Take 200 mg by mouth every 6 (six) hours as needed.     losartan  (COZAAR ) 50 MG tablet Take 1 tablet (50 mg total) by mouth in the morning and at bedtime. 180 tablet 3   meclizine  (ANTIVERT ) 25 MG tablet Take 1 tablet (25 mg total) by mouth 3 (three) times daily as needed for dizziness. 30 tablet 0   metoprolol  tartrate (LOPRESSOR ) 25 MG tablet TAKE 1 TABLET BY MOUTH TWICE A DAY 180 tablet 3   Multiple Vitamin (MULTIVITAMIN) tablet Take 1 tablet by mouth 2 (  two) times a week.     Nutritional Supplements (BRAIN SUPPORT PO) Take by mouth daily.     Pyridoxine HCl (VITAMIN B-6 PO) Take by mouth.     Spacer/Aero-Holding Chambers DEVI USE daily as needed 1 each 0   Thiamine HCl (VITAMIN B-1 PO) Take by mouth.     triamcinolone  ointment (KENALOG ) 0.5 % Apply 1 Application topically 2 (two) times daily. 30 g 0   No facility-administered medications prior to visit.   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 No pertinent surgical history. Allergies[1]     Objective:    Physical Exam Vitals and nursing note reviewed.  Constitutional:      Appearance: Normal appearance. She is not ill-appearing.     Interventions: Face mask in place.  HENT:     Right Ear: Tympanic membrane and ear canal normal.     Left Ear: Ear canal normal. Tympanic membrane is scarred and erythematous. Tympanic membrane is not bulging.     Nose:     Right Sinus: No frontal sinus tenderness.     Left Sinus: No frontal sinus tenderness.     Mouth/Throat:     Mouth: Mucous membranes are moist.     Pharynx: Posterior oropharyngeal erythema (mild) present. No pharyngeal swelling, oropharyngeal exudate or uvula swelling.     Tonsils: No tonsillar exudate or tonsillar abscesses.  Cardiovascular:     Rate and Rhythm: Normal rate and regular rhythm.  Pulmonary:     Effort: Pulmonary effort is normal.     Breath sounds: Normal breath sounds.  Musculoskeletal:        General: Normal range of motion.  Lymphadenopathy:     Head:     Right side of head: No preauricular or posterior auricular adenopathy.     Left side of head: No preauricular or posterior auricular adenopathy.     Cervical: No cervical adenopathy.  Skin:    General: Skin is warm and dry.  Neurological:     Mental Status: She is alert.  Psychiatric:        Mood and Affect: Mood normal.        Behavior: Behavior normal.    BP 118/60 (BP Location: Left Arm, Patient Position: Sitting, Cuff Size: Normal)   Pulse 65   Temp (!) 97.4 F (36.3 C) (Temporal)   Ht 4' 10 (1.473 m)   Wt 123 lb (55.8 kg)   SpO2 99%   BMI 25.71 kg/m  Wt Readings from Last 3 Encounters:  10/20/24 123 lb (55.8 kg)  07/12/24 120 lb 9.6 oz (54.7 kg)  07/11/24 121 lb 9.6 oz (55.2 kg)      Jelesa Mangini, NP     [1]  Allergies Allergen Reactions   Norvasc  [Amlodipine ]     SWELLING    Olmesartan      Dizziness    "

## 2024-10-23 ENCOUNTER — Other Ambulatory Visit: Payer: Self-pay | Admitting: Physical Medicine and Rehabilitation

## 2024-10-24 ENCOUNTER — Ambulatory Visit: Payer: Self-pay

## 2024-10-24 ENCOUNTER — Other Ambulatory Visit: Payer: Self-pay | Admitting: Family

## 2024-10-24 DIAGNOSIS — H65195 Other acute nonsuppurative otitis media, recurrent, left ear: Secondary | ICD-10-CM

## 2024-10-24 DIAGNOSIS — J029 Acute pharyngitis, unspecified: Secondary | ICD-10-CM

## 2024-10-24 MED ORDER — AMOXICILLIN 400 MG/5ML PO SUSR: 1000.0000 mg | Freq: Two times a day (BID) | ORAL | 0 refills | Status: AC

## 2024-10-24 NOTE — Telephone Encounter (Signed)
 FYI Only or Action Required?: Action required by provider: update on patient condition.  Patient was last seen in primary care on 10/20/2024 by Lucius Krabbe, NP.  Called Nurse Triage reporting No chief complaint on file..  Symptoms began several days ago.  Interventions attempted: Prescription medications: amoxicillin - trying to obtain.  Symptoms are: unchanged.  Triage Disposition: See PCP When Office is Open (Within 3 Days)  Patient/caregiver understands and will follow disposition?: Yes  Copied from CRM [id number]. Topic: Clinical - Prescription Issue >> Oct 24, 2024  9:32 AM Delon DASEN wrote: Reason for CRM: pharmacy will not fill two prescriptions for antibiotic, patient is wanting to get the liquid amoxicillin  (AMOXIL ) 400 MG/5ML suspension, need it filled today Reason for Disposition  Prescription request for new medicine (not a refill)  Answer Assessment - Initial Assessment Questions Amoxicillin  sent in for infection- capsules are hard to swallow for pt so script for caps and for suspension prescribed. Pharmacy only allowed for one to get picked up and pt son in law picked up Capsules on accident.   Advised to reach back out to the pharmacy and ask if can exchange since prescription is in place.   She is asking if Z-Pack can be sent in just in case she can't get suspension because z-pack is smaller pills. Advised to let us  know what pharmacy says and will ask about z-pack. Pharmacy confirmed   1. NAME of MEDICINE: What medicine(s) are you calling about?     Amoxicillin  2. QUESTION: What is your question? (e.g., double dose of medicine, side effect)     Can the suspension be sent  3. PRESCRIBER: Who prescribed the medicine? Reason: if prescribed by specialist, call should be referred to that group.     [place]  Protocols used: Medication Question Call-A-AH

## 2024-10-24 NOTE — Telephone Encounter (Signed)
 RX resent to pharmacy for the suspension AMOX, pt also notified via MyChart message.

## 2024-10-24 NOTE — Telephone Encounter (Signed)
 See note

## 2024-11-15 ENCOUNTER — Encounter: Admitting: Family Medicine

## 2025-05-25 ENCOUNTER — Ambulatory Visit
